# Patient Record
Sex: Female | Born: 1970 | Race: White | Hispanic: No | Marital: Married
Health system: Southern US, Community
[De-identification: ages and names within clinical notes are randomized; demographics above are authoritative.]

## PROBLEM LIST (undated history)

## (undated) DIAGNOSIS — Z809 Family history of malignant neoplasm, unspecified: Secondary | ICD-10-CM

## (undated) DIAGNOSIS — C801 Malignant (primary) neoplasm, unspecified: Secondary | ICD-10-CM

## (undated) HISTORY — DX: Family history of malignant neoplasm, unspecified: Z80.9

## (undated) HISTORY — PX: MASTECTOMY: SHX3

---

## 2005-06-09 ENCOUNTER — Inpatient Hospital Stay (HOSPITAL_COMMUNITY): Admission: AD | Admit: 2005-06-09 | Discharge: 2005-06-09 | Payer: Self-pay | Admitting: Obstetrics and Gynecology

## 2005-06-11 ENCOUNTER — Inpatient Hospital Stay (HOSPITAL_COMMUNITY): Admission: AD | Admit: 2005-06-11 | Discharge: 2005-06-14 | Payer: Self-pay | Admitting: Obstetrics and Gynecology

## 2008-01-27 ENCOUNTER — Emergency Department (HOSPITAL_COMMUNITY): Admission: EM | Admit: 2008-01-27 | Discharge: 2008-01-27 | Payer: Self-pay | Admitting: Emergency Medicine

## 2010-12-04 NOTE — H&P (Signed)
[name], [name]               ACCOUNT NO.:  [id number]   MEDICAL RECORD NO.:  [id number]          PATIENT TYPE:  INP   LOCATION:  9110                          FACILITY:  [facility]   PHYSICIAN:  Richardean Sale, M.D.   DATE OF BIRTH:  1971-01-02   DATE OF ADMISSION:  06/11/2005  DATE OF DISCHARGE:                                HISTORY & PHYSICAL   ADMISSION DIAGNOSIS:  Thirty-nine-plus weeks intrauterine pregnancy in  labor.   HISTORY OF PRESENT ILLNESS:  This is a 40 year old gravida 2 2, para 1-0-0-1  white female who is at 39+ weeks gestation with a due date of June 17, 2005 who presented on the evening of June 11, 2005 complaining of  contractions every two to three minutes starting at 3 p.m.  Upon arrival to the maternity admissions, fetal heart rate tracing was  reactive. She was contracting every two to three minutes and cervix was 3 cm  dilated, 70% and -1 station.  The patient was monitored in maternity  admissions and made adequate cervical change to 5 cm, 80% and -1 station.  She reports good movement. Denies any loss of fluid or vaginal bleeding.  Prenatal care has been at [facility] since 31 weeks and prior to that  in [place].  Pregnancy has been uncomplicated.   PAST OBSTETRIC HISTORY:  Spontaneous vaginal delivery at term.  No  complications.   GYNECOLOGIC HISTORY:  No history of abnormal Pap smears or sexually  transmitted infections.   PAST MEDICAL HISTORY:  No prior hospitalizations.   PAST SURGICAL HISTORY:  None.   FAMILY HISTORY:  Coronary artery disease in grandparents.  No history of  birth defects, congenital anomalies, cystic fibrosis, Down syndrome, spina  bifida or other inherited illnesses.   SOCIAL HISTORY:  She is married.  Denies tobacco, alcohol or drugs.   MEDICATIONS:  Prenatal vitamins.   ALLERGIES:  No known drug allergies.   PRENATAL LABS:  Blood type is B positive, antibody screen negative.  RPR  nonreactive.  Rubella  immune.  Hepatitis B surface antigen negative.  HIV  negative.  One-hour Glucola 84.  Group B strep negative on May 12, 2005.  Last ultrasound at approximately [redacted] weeks gestation, showed an appropriately  growing fetus and normal amniotic fluid.  The patient did undergo an anatomy  ultrasound in [place] that was normal.   PHYSICAL EXAMINATION:  VITAL SIGNS:  Afebrile. Vital signs stable.  GENERAL:  She is well-developed, well-nourished white female in no acute  distress.  HEART:  Regular rate and rhythm.  LUNGS:  Clear to auscultation bilaterally.  ABDOMEN:  Gravid, soft, nontender.  Fundal height appears AGA.  PELVIC:  Cervix is 5 cm, 80%, -1 station, vertex, bag of waters intact.  EXTREMITIES:  Trace edema bilaterally.  Nontender.   Fetal heart rate tracing is reactive.  Tocometer tracing shows contractions  every two to three minutes.   ASSESSMENT:  A 40 year old, gravida 2, para 1 white female at 39+ weeks in  labor.   PLAN:  1.  Will admit to labor and delivery.  2.  The patient  may have intravenous Stadol or epidural as needed for pain      relief.  3.  Continue fetal monitoring and amniotomy if needed.  4.  Anticipated vaginal delivery.      Richardean Sale, M.D.  Electronically Signed     JW/MEDQ  D:  06/12/2005  T:  06/12/2005  Job:  [id number]

## 2011-04-15 LAB — DIFFERENTIAL
Lymphocytes Relative: 28
Lymphs Abs: 1.6
Monocytes Absolute: 0.5
Monocytes Relative: 8
Neutro Abs: 3.7

## 2011-04-15 LAB — URINALYSIS, ROUTINE W REFLEX MICROSCOPIC
Glucose, UA: NEGATIVE
Hgb urine dipstick: NEGATIVE
Specific Gravity, Urine: 1.027
pH: 7

## 2011-04-15 LAB — COMPREHENSIVE METABOLIC PANEL
Albumin: 3.7
Alkaline Phosphatase: 43
BUN: 21
Potassium: 4.5
Total Protein: 6.6

## 2011-04-15 LAB — POCT PREGNANCY, URINE
Operator id: 17359
Preg Test, Ur: NEGATIVE

## 2011-04-15 LAB — CBC
HCT: 40.1
Platelets: 242
RDW: 13.5

## 2011-04-15 LAB — URINE MICROSCOPIC-ADD ON

## 2014-01-31 ENCOUNTER — Other Ambulatory Visit (HOSPITAL_COMMUNITY): Payer: Self-pay | Admitting: Obstetrics

## 2014-01-31 DIAGNOSIS — Z1231 Encounter for screening mammogram for malignant neoplasm of breast: Secondary | ICD-10-CM

## 2014-02-08 ENCOUNTER — Ambulatory Visit (HOSPITAL_COMMUNITY)
Admission: RE | Admit: 2014-02-08 | Discharge: 2014-02-08 | Disposition: A | Discharge status: Home / Self Care | Payer: BC Managed Care – PPO | Source: Ambulatory Visit | Attending: Obstetrics | Admitting: Obstetrics

## 2014-02-08 DIAGNOSIS — Z1231 Encounter for screening mammogram for malignant neoplasm of breast: Secondary | ICD-10-CM

## 2017-09-08 ENCOUNTER — Encounter: Payer: Self-pay | Admitting: Oncology

## 2018-09-25 ENCOUNTER — Encounter: Payer: Self-pay | Admitting: Oncology

## 2018-09-25 DIAGNOSIS — N6042 Mammary duct ectasia of left breast: Secondary | ICD-10-CM | POA: Diagnosis not present

## 2018-09-25 DIAGNOSIS — N6342 Unspecified lump in left breast, subareolar: Secondary | ICD-10-CM | POA: Diagnosis not present

## 2018-09-25 DIAGNOSIS — N63 Unspecified lump in unspecified breast: Secondary | ICD-10-CM | POA: Diagnosis not present

## 2018-09-25 DIAGNOSIS — N6453 Retraction of nipple: Secondary | ICD-10-CM | POA: Diagnosis not present

## 2018-10-02 ENCOUNTER — Encounter: Payer: Self-pay | Admitting: Oncology

## 2018-10-02 ENCOUNTER — Other Ambulatory Visit: Payer: Self-pay | Admitting: Radiology

## 2018-10-02 DIAGNOSIS — C50112 Malignant neoplasm of central portion of left female breast: Secondary | ICD-10-CM | POA: Diagnosis not present

## 2018-10-02 DIAGNOSIS — D0592 Unspecified type of carcinoma in situ of left breast: Secondary | ICD-10-CM | POA: Diagnosis not present

## 2018-10-02 DIAGNOSIS — N6342 Unspecified lump in left breast, subareolar: Secondary | ICD-10-CM | POA: Diagnosis not present

## 2018-10-02 DIAGNOSIS — N6453 Retraction of nipple: Secondary | ICD-10-CM | POA: Diagnosis not present

## 2018-10-02 DIAGNOSIS — N6321 Unspecified lump in the left breast, upper outer quadrant: Secondary | ICD-10-CM | POA: Diagnosis not present

## 2018-10-02 DIAGNOSIS — R921 Mammographic calcification found on diagnostic imaging of breast: Secondary | ICD-10-CM | POA: Diagnosis not present

## 2018-10-03 DIAGNOSIS — N6342 Unspecified lump in left breast, subareolar: Secondary | ICD-10-CM | POA: Diagnosis not present

## 2018-10-09 ENCOUNTER — Encounter: Payer: Self-pay | Admitting: *Deleted

## 2018-10-09 ENCOUNTER — Telehealth: Payer: Self-pay | Admitting: Oncology

## 2018-10-09 NOTE — Telephone Encounter (Signed)
Spoke to patient to confirm morning [facility] appointment for 3/25, packet mailed to patient

## 2018-10-09 NOTE — Telephone Encounter (Signed)
Spoke to patient to confirm morning [facility] appointment for 3/25, email was sent to patient

## 2018-10-10 ENCOUNTER — Telehealth: Payer: Self-pay | Admitting: Radiation Oncology

## 2018-10-10 ENCOUNTER — Other Ambulatory Visit: Payer: Self-pay | Admitting: *Deleted

## 2018-10-10 DIAGNOSIS — Z170 Estrogen receptor positive status [ER+]: Principal | ICD-10-CM | POA: Insufficient documentation

## 2018-10-10 DIAGNOSIS — C50412 Malignant neoplasm of upper-outer quadrant of left female breast: Secondary | ICD-10-CM | POA: Insufficient documentation

## 2018-10-10 NOTE — Telephone Encounter (Signed)
I called the patient to let her know about our plans to be available remotely or to be in touch at a later time. She requests online webex for our discussion. I will coordinate with the navigators to ensure that we can meet with the patient remotely.

## 2018-10-10 NOTE — Progress Notes (Signed)
[facility]  [phone] [phone]     ID: [name] DOB: 1970/07/29  MR#: [id number]  AJG#:[id number]  Patient Care Team: Orpah Melter, MD as PCP - General (Family Medicine) Mauro Kaufmann, RN as Oncology Nurse Navigator Rockwell Germany, RN as Oncology Nurse Navigator Jovita Kussmaul, MD as Consulting Physician (General Surgery) Emelda Kohlbeck, Virgie Dad, MD as Consulting Physician (Oncology) Kyung Rudd, MD as Consulting Physician (Radiation Oncology) Aloha Gell, MD as Consulting Physician (Obstetrics and Gynecology) Chauncey Cruel, MD OTHER MD:  CHIEF COMPLAINT: Estrogen receptor positive breast cancer  CURRENT TREATMENT: Definitive surgery pending   HISTORY OF CURRENT ILLNESS: "[name]" presented with left nipple retraction for 1 week with retroareolar mass in the upper outer quadrant. She underwent bilateral diagnostic mammography with CAD and left breast ultrasonography at [facility] on 09/25/2018 showing: 7 mm oval duct in the left breast; no significant abnormalities in the left axilla. She also underwent additional imaging with left breast digital diagnostic mammogram on 10/02/2018 showing: new 0.4 cm cluster of grouped heterogeneous calcifications in the left breast.  Accordingly on 10/02/2018 she proceeded to biopsy of the left breast area in question. The pathology [id number]) from this procedure showed: mammary carcinoma in situ at the 3 o'clock mass, with possible focal microinvasion; invasive and in situ mammary carcinoma at the 12 o'clock mass, immunostain for E-cadherin is negative in the tumor cells, consistent with a lobular phenotype. Prognostic indicators significant for: estrogen receptor, 90% positive, with moderate staining intensity and progesterone receptor, 100% positive, with strong staining intensity. Proliferation marker Ki67 at <1%. HER2 negative (1+).   The patient's subsequent history is as detailed below.    INTERVAL HISTORY: [name] was evaluated in the multidisciplinary breast cancer clinic on 10/11/2018 with her husband [name] participating by phone. Her case was also presented at the multidisciplinary breast cancer conference on the same day. At that time a preliminary plan was proposed: Left mastectomy with sentinel lymph node sampling, Oncotype or MammaPrint depending on the final pathology report, adjuvant radiation, antiestrogens.   REVIEW OF SYSTEMS: Aside from the changes in the breast that the patient noted, there have been no other symptoms related to her breast cancer.  She reports night sweats.  He denies unusual headaches, visual changes, nausea, vomiting, stiff neck, dizziness, or gait imbalance. There has been no cough, phlegm production, or pleurisy, no chest pain or pressure, and no change in bowel or bladder habits. The patient denies rash, bleeding, unexplained fatigue or unexplained weight loss. A detailed review of systems was otherwise entirely negative.   PAST MEDICAL HISTORY: History reviewed. No pertinent past medical history.  PAST SURGICAL HISTORY: History reviewed. No pertinent surgical history.  FAMILY HISTORY History reviewed. No pertinent family history. As of March 2020, patient's father is living and healthy at age 61. Patient's mother is also living and healthy at age 27. The patient denies a family hx of breast or ovarian cancer. She has 1 sister.  GYNECOLOGIC HISTORY:  No LMP recorded. Menarche: 48 years old Age at first live birth: 48 years old [place] P 2 LMP in 2010 Contraceptive currently has Mirena IUD in place, reports she used birth control pills between ages 5 to 55 HRT n/a  Hysterectomy? no BSO? 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  She has a PhD in Probation officer.  Husband [name] is an Art gallery manager. She lives at home with her husband and 3 children. She has two  children, [name] age 22 and  [name] age 39. Husband [name] has one daughter, Ammy Lienhard age 70, who lives with them.      ADVANCED DIRECTIVES: In the absence of any documentation to the contrary her husband [name] is her HCPOA.   HEALTH MAINTENANCE: Social History   Tobacco Use  . Smoking status: Never Smoker  . Smokeless tobacco: Never Used  Substance Use Topics  . Alcohol use: Yes    Alcohol/week: 3.0 standard drinks    Types: 3 Standard drinks or equivalent per week  . Drug use: Never     Colonoscopy: never done  PAP: 08/2017  Bone density: never done   No Known Allergies  No current outpatient medications on file.   No current facility-administered medications for this visit.     OBJECTIVE: Young white woman in no acute distress  There were no vitals filed for this visit.   There is no height or weight on file to calculate BMI.   Wt Readings from Last 3 Encounters:  10/11/18 140 lb (63.5 kg)      ECOG FS:0 - Asymptomatic  Ocular: Sclerae unicteric, pupils round and equal Ear-nose-throat: Oropharynx clear and moist Lymphatic: No cervical or supraclavicular adenopathy Lungs no rales or rhonchi Heart regular rate and rhythm Abd soft, nontender, positive bowel sounds MSK no focal spinal tenderness, no joint edema Neuro: non-focal, well-oriented, appropriate affect Breasts: The right breast is unremarkable.  The left breast is status post recent biopsies.  There are moderate ecchymoses.  The nipple is slightly inverted.  A photograph, with the right breast in for comparison, is below  Left breast 10/11/2018; right breast for comparison of nipple contour     LAB RESULTS:  CMP     Component Value Date/Time   NA 141 10/11/2018 0846   K 4.4 10/11/2018 0846   CL 107 10/11/2018 0846   CO2 24 10/11/2018 0846   GLUCOSE 86 10/11/2018 0846   BUN 14 10/11/2018 0846   CREATININE 0.87 10/11/2018 0846   CALCIUM 9.1 10/11/2018 0846   PROT 7.1 10/11/2018 0846   ALBUMIN 4.1  10/11/2018 0846   AST 16 10/11/2018 0846   ALT 18 10/11/2018 0846   ALKPHOS 40 10/11/2018 0846   BILITOT 0.6 10/11/2018 0846   GFRNONAA >60 10/11/2018 0846   GFRAA >60 10/11/2018 0846    No results found for: TOTALPROTELP, ALBUMINELP, A1GS, A2GS, BETS, BETA2SER, GAMS, MSPIKE, SPEI  No results found for: KPAFRELGTCHN, LAMBDASER, KAPLAMBRATIO  Lab Results  Component Value Date   WBC 5.9 10/11/2018   NEUTROABS 3.4 10/11/2018   HGB 14.4 10/11/2018   HCT 44.9 10/11/2018   MCV 95.9 10/11/2018   PLT 314 10/11/2018    '@LASTCHEMISTRY' @  No results found for: LABCA2  No components found for: WUJWJX914  No results for input(s): INR in the last 168 hours.  No results found for: LABCA2  No results found for: NWG956  No results found for: OZH086  No results found for: VHQ469  No results found for: CA2729  No components found for: HGQUANT  No results found for: CEA1 / No results found for: CEA1   No results found for: AFPTUMOR  No results found for: CHROMOGRNA  No results found for: PSA1  Appointment on 10/11/2018  Component Date Value Ref Range Status  . WBC Count 10/11/2018 5.9  4.0 - 10.5 K/uL Final  . RBC 10/11/2018 4.68  3.87 - 5.11 MIL/uL Final  . Hemoglobin 10/11/2018 14.4  12.0 - 15.0  g/dL Final  . HCT 10/11/2018 44.9  36.0 - 46.0 % Final  . MCV 10/11/2018 95.9  80.0 - 100.0 fL Final  . MCH 10/11/2018 30.8  26.0 - 34.0 pg Final  . MCHC 10/11/2018 32.1  30.0 - 36.0 g/dL Final  . RDW 10/11/2018 12.6  11.5 - 15.5 % Final  . Platelet Count 10/11/2018 314  150 - 400 K/uL Final  . nRBC 10/11/2018 0.0  0.0 - 0.2 % Final  . Neutrophils Relative % 10/11/2018 57  % Final  . Neutro Abs 10/11/2018 3.4  1.7 - 7.7 K/uL Final  . Lymphocytes Relative 10/11/2018 30  % Final  . Lymphs Abs 10/11/2018 1.8  0.7 - 4.0 K/uL Final  . Monocytes Relative 10/11/2018 8  % Final  . Monocytes Absolute 10/11/2018 0.5  0.1 - 1.0 K/uL Final  . Eosinophils Relative 10/11/2018 4  % Final   . Eosinophils Absolute 10/11/2018 0.3  0.0 - 0.5 K/uL Final  . Basophils Relative 10/11/2018 1  % Final  . Basophils Absolute 10/11/2018 0.0  0.0 - 0.1 K/uL Final  . Immature Granulocytes 10/11/2018 0  % Final  . Abs Immature Granulocytes 10/11/2018 0.01  0.00 - 0.07 K/uL Final   Performed at [facility], [place] [address], [place], [place] [address]  . Sodium 10/11/2018 141  135 - 145 mmol/L Final  . Potassium 10/11/2018 4.4  3.5 - 5.1 mmol/L Final  . Chloride 10/11/2018 107  98 - 111 mmol/L Final  . CO2 10/11/2018 24  22 - 32 mmol/L Final  . Glucose, Bld 10/11/2018 86  70 - 99 mg/dL Final  . BUN 10/11/2018 14  6 - 20 mg/dL Final  . Creatinine 10/11/2018 0.87  0.44 - 1.00 mg/dL Final  . Calcium 10/11/2018 9.1  8.9 - 10.3 mg/dL Final  . Total Protein 10/11/2018 7.1  6.5 - 8.1 g/dL Final  . Albumin 10/11/2018 4.1  3.5 - 5.0 g/dL Final  . AST 10/11/2018 16  15 - 41 U/L Final  . ALT 10/11/2018 18  0 - 44 U/L Final  . Alkaline Phosphatase 10/11/2018 40  38 - 126 U/L Final  . Total Bilirubin 10/11/2018 0.6  0.3 - 1.2 mg/dL Final  . GFR, Est Non Af Am 10/11/2018 >60  >60 mL/min Final  . GFR, Est AFR Am 10/11/2018 >60  >60 mL/min Final  . Anion gap 10/11/2018 10  5 - 15 Final   Performed at [facility], [place] [address], [place], [place] [address]    (this displays the last labs from the last 3 days)  No results found for: TOTALPROTELP, ALBUMINELP, A1GS, A2GS, BETS, BETA2SER, GAMS, MSPIKE, SPEI (this displays SPEP labs)  No results found for: KPAFRELGTCHN, LAMBDASER, KAPLAMBRATIO (kappa/lambda light chains)  No results found for: HGBA, HGBA2QUANT, HGBFQUANT, HGBSQUAN (Hemoglobinopathy evaluation)   No results found for: LDH  No results found for: IRON, TIBC, IRONPCTSAT (Iron and TIBC)  No results found for: FERRITIN  Urinalysis No results found for: COLORURINE, APPEARANCEUR, LABSPEC, PHURINE, GLUCOSEU, HGBUR, BILIRUBINUR, KETONESUR,  PROTEINUR, UROBILINOGEN, NITRITE, LEUKOCYTESUR   STUDIES: Mammography studies reviewed with the patient  ELIGIBLE FOR AVAILABLE RESEARCH PROTOCOL: no  ASSESSMENT: 48 y.o. 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 Specifically we reviewed the biology of the patient's diagnosis and the specifics of her situation.  We first reviewed the fact that cancer is not one disease but more than 100 different diseases and that it is important to keep them separate-- otherwise when friends and relatives discuss their own cancer experiences with Aziya confusion can result. Similarly we explained that if breast cancer spreads to the bone or liver, the patient would not have bone cancer or liver cancer, but breast cancer in the bone and breast cancer in the liver: one cancer in three places-- not 3 different cancers which otherwise would have to be treated in 3 different ways.  We discussed the difference between local and systemic therapy. In terms of loco-regional treatment, lumpectomy plus radiation is equivalent to mastectomy as far as survival is concerned. For this reason, and because the cosmetic results are generally superior, we recommend breast conserving surgery. .  We then discussed the rationale for systemic therapy. There is some risk that this cancer may have already spread to other parts of her body.  Patients frequently ask at this point about bone scans, CAT scans and PET scans to find out if they have occult breast cancer somewhere else. The problem is that in early stage disease we are much more likely to find false positives then true cancers and this would expose the patient to unnecessary procedures as well as unnecessary radiation. Scans cannot answer the question the patient really would like to know, which is whether she has microscopic disease elsewhere in her body. For those reasons we do not recommend them.  Of course we would proceed to aggressive evaluation of any symptoms that might suggest metastatic disease, but that is not the case here.  Next we went over the options for systemic therapy which are anti-estrogens, anti-HER-2 immunotherapy, and chemotherapy. Jhoanna does not meet criteria for anti-HER-2 immunotherapy. She is a good candidate for anti-estrogens.  The question of chemotherapy is more complicated. Chemotherapy is most effective in rapidly growing, aggressive tumors. It is much less effective in low-grade, slow growing cancers, like [place] 's. For that reason we are going to request an Oncotype or MammaPrint from the definitive surgical sample, as suggested by NCCN guidelines. That will help [place] make a definitive decision regarding chemotherapy in this case.  The plan accordingly is to start with an MRI in this lobular breast cancer case, proceed to definitive surgery as appropriate, and then review the Oncotype or MammaPrint results.  I have tentatively scheduled a return appointment with me late June but we will discuss her situation once we have the genomics on hand  Yanissa has a good understanding of the overall plan. She agrees with it. She knows the goal of treatment in her case is cure. She will call with any problems that may develop before her next visit here.  Chauncey Cruel, MD   10/11/2018 3:57 PM Medical Oncology and Hematology [facility] [address] [place], [place] [address] Tel. [phone]    Fax. [phone]  This document serves as a record of services personally performed by Lurline Del, MD. It was created on his behalf by  Wilburn Mylar, a trained medical scribe. The creation of this record is based on the scribe's personal observations and the provider's statements to them.   I, Lurline Del MD, have reviewed the above documentation for accuracy and completeness, and I agree with the above.

## 2018-10-11 ENCOUNTER — Encounter: Payer: Self-pay | Admitting: Plastic Surgery

## 2018-10-11 ENCOUNTER — Ambulatory Visit: Payer: Self-pay | Admitting: General Surgery

## 2018-10-11 ENCOUNTER — Encounter: Payer: Self-pay | Admitting: Oncology

## 2018-10-11 ENCOUNTER — Other Ambulatory Visit: Payer: Self-pay | Admitting: *Deleted

## 2018-10-11 ENCOUNTER — Inpatient Hospital Stay: Payer: 59 | Attending: Oncology | Admitting: Oncology

## 2018-10-11 ENCOUNTER — Inpatient Hospital Stay: Payer: 59

## 2018-10-11 ENCOUNTER — Ambulatory Visit (INDEPENDENT_AMBULATORY_CARE_PROVIDER_SITE_OTHER): Payer: 59 | Admitting: Plastic Surgery

## 2018-10-11 ENCOUNTER — Ambulatory Visit: Payer: 59 | Attending: Radiation Oncology | Admitting: Radiation Oncology

## 2018-10-11 ENCOUNTER — Telehealth: Payer: Self-pay | Admitting: Oncology

## 2018-10-11 ENCOUNTER — Other Ambulatory Visit: Payer: Self-pay

## 2018-10-11 VITALS — BP 127/86 | HR 79 | Temp 98.8°F | Ht 65.5 in | Wt 140.0 lb

## 2018-10-11 VITALS — BP 127/86 | HR 79 | Temp 98.8°F | Resp 18 | Ht 65.0 in | Wt 140.0 lb

## 2018-10-11 DIAGNOSIS — Z170 Estrogen receptor positive status [ER+]: Secondary | ICD-10-CM | POA: Diagnosis not present

## 2018-10-11 DIAGNOSIS — C50412 Malignant neoplasm of upper-outer quadrant of left female breast: Secondary | ICD-10-CM

## 2018-10-11 DIAGNOSIS — C50112 Malignant neoplasm of central portion of left female breast: Secondary | ICD-10-CM | POA: Diagnosis not present

## 2018-10-11 LAB — CBC WITH DIFFERENTIAL (CANCER CENTER ONLY)
Abs Immature Granulocytes: 0.01 K/uL (ref 0.00–0.07)
Basophils Absolute: 0 K/uL (ref 0.0–0.1)
Basophils Relative: 1 %
Eosinophils Absolute: 0.3 K/uL (ref 0.0–0.5)
Eosinophils Relative: 4 %
HCT: 44.9 % (ref 36.0–46.0)
Hemoglobin: 14.4 g/dL (ref 12.0–15.0)
Immature Granulocytes: 0 %
Lymphocytes Relative: 30 %
Lymphs Abs: 1.8 K/uL (ref 0.7–4.0)
MCH: 30.8 pg (ref 26.0–34.0)
MCHC: 32.1 g/dL (ref 30.0–36.0)
MCV: 95.9 fL (ref 80.0–100.0)
Monocytes Absolute: 0.5 K/uL (ref 0.1–1.0)
Monocytes Relative: 8 %
Neutro Abs: 3.4 K/uL (ref 1.7–7.7)
Neutrophils Relative %: 57 %
Platelet Count: 314 K/uL (ref 150–400)
RBC: 4.68 MIL/uL (ref 3.87–5.11)
RDW: 12.6 % (ref 11.5–15.5)
WBC Count: 5.9 K/uL (ref 4.0–10.5)
nRBC: 0 % (ref 0.0–0.2)

## 2018-10-11 LAB — CMP (CANCER CENTER ONLY)
ALT: 18 U/L (ref 0–44)
AST: 16 U/L (ref 15–41)
Albumin: 4.1 g/dL (ref 3.5–5.0)
Alkaline Phosphatase: 40 U/L (ref 38–126)
Anion gap: 10 (ref 5–15)
BUN: 14 mg/dL (ref 6–20)
CO2: 24 mmol/L (ref 22–32)
Calcium: 9.1 mg/dL (ref 8.9–10.3)
Chloride: 107 mmol/L (ref 98–111)
Creatinine: 0.87 mg/dL (ref 0.44–1.00)
GFR, Est AFR Am: >60 mL/min
GFR, Estimated: >60 mL/min
Glucose, Bld: 86 mg/dL (ref 70–99)
Potassium: 4.4 mmol/L (ref 3.5–5.1)
Sodium: 141 mmol/L (ref 135–145)
Total Bilirubin: 0.6 mg/dL (ref 0.3–1.2)
Total Protein: 7.1 g/dL (ref 6.5–8.1)

## 2018-10-11 NOTE — H&P (View-Only) (Signed)
Patient ID: [name], female    DOB: 06-Jul-1971, 48 y.o.   MRN: [id number]   Chief Complaint  Patient presents with  . Breast Problem  . Breast Cancer    The patient is a 48 yrs old wf here for consultation for left breast reconstruction with her husband.  She noticed the left nipple was pointing downward.  She then palpated a mass around the nipple areola area.  She had a mammogram followed by biopsy.  The results are not in the computer yet.  She is 5 feet 5 inches and 140 pounds.  She is a 36 A bra size.  She is interested in reconstruction.  She is not a smoker and is otherwise in good health. Genetics may be ordered.  She is also scheduled to see the radiation oncologist and radiation is planned.  She was sent by Dr. Marlou Starks.   Review of Systems  Constitutional: Negative.  Negative for activity change and appetite change.  HENT: Negative.   Eyes: Negative.   Respiratory: Negative.  Negative for chest tightness and shortness of breath.   Cardiovascular: Negative.  Negative for palpitations.  Gastrointestinal: Negative.  Negative for abdominal pain.  Endocrine: Negative.   Genitourinary: Negative.   Musculoskeletal: Negative.  Negative for joint swelling.  Neurological: Negative.   Hematological: Negative.   Psychiatric/Behavioral: Negative.     History reviewed. No pertinent past medical history.  History reviewed. No pertinent surgical history.   No current outpatient medications on file.   Objective:   Vitals:   10/11/18 1227  BP: 127/86  Pulse: 79  Temp: 98.8 F (37.1 C)  SpO2: 99%    Physical Exam Vitals signs and nursing note reviewed.  Constitutional:      Appearance: Normal appearance.  HENT:     Head: Normocephalic and atraumatic.     Nose: Nose normal.     Mouth/Throat:     Mouth: Mucous membranes are moist.  Eyes:     Extraocular Movements: Extraocular movements intact.  Cardiovascular:     Rate and Rhythm: Normal rate.     Pulses:  Normal pulses.  Pulmonary:     Effort: Pulmonary effort is normal. No respiratory distress.     Breath sounds: No wheezing.  Abdominal:     General: Abdomen is flat. There is no distension.  Skin:    Capillary Refill: Capillary refill takes less than 2 seconds.  Neurological:     General: No focal deficit present.     Mental Status: She is alert.  Psychiatric:        Mood and Affect: Mood normal.        Behavior: Behavior normal.     Assessment & Plan:  Malignant neoplasm of upper-outer quadrant of left breast in female, estrogen receptor positive ([place])  Assessment and Plan:  A long, detailed conversation was had regarding the patient's options for breast reconstruction. Five main points, which are explained to all breast reconstruction patients, were discussed.  1. Breast reconstruction is an optional process.  2. Breast reconstruction is a multi-stage process which involves multiple surgeries spaced several months apart. The entire process can take over one year.  3. The major goal of breast reconstruction is to have the patient look normal in clothing. When naked, there will always be scars.  4. Asymmetries are often present during the reconstruction process. Several operations may be needed, including surgery to the non-cancerous breast, to achieve satisfactory results.  5. No matter the  reconstructive method, there are ways that the reconstruction can fail and a secondary reconstructive plan would need to be created.   A general discussion regarding all available methods of breast reconstruction were discussed. The types of reconstructions described included.  1. Tissue expander and implant based reconstruction, both single and multi-stage approaches.  2. Autologous only reconstructions, including free abdominal-tissue based reconstructions.  3. Combination procedures, particularly latissismus dorsi flaps combined with either expanders or implants.  For each of the reconstruction  methods mentioned above, the risks, benefits, alternatives, scarring, and recovery time were discussed in great detail. Specific risks detailed included bleeding, infection, hematoma, seroma, scarring, pain, wound healing complications, flap loss, fat necrosis, capsular contracture, need for implant removal, donor site complications, bulge, hernia, umbilical necrosis, need for urgent reoperation, and need for dressing changes were discussed.   Assessment  Once all reconstruction options were presented, a focused discussion was had regarding the patient's suitability for each of these procedures.  A total of 50 minutes of face-to-face time was spent in this encounter, of which >50% was spent in counseling.  We will attempt for placement of an left breast expander and Flex HD placement if the skin allows.  Patient is aware that it won't be placed if the skin is too tight and there is compromise of the skin flaps.  She would then think about a latissimus muscle flap 6-12 months after the radiation ends.   Harvey, DO

## 2018-10-11 NOTE — Progress Notes (Signed)
Patient ID: [name], female    DOB: [birth year], 48 y.o.   MRN: [id number]   Chief Complaint  Patient presents with  . Breast Problem  . Breast Cancer    The patient is a 48 yrs old wf here for consultation for left breast reconstruction with her husband.  She noticed the left nipple was pointing downward.  She then palpated a mass around the nipple areola area.  She had a mammogram followed by biopsy.  The results are not in the computer yet.  She is 5 feet 5 inches and 140 pounds.  She is a 36 A bra size.  She is interested in reconstruction.  She is not a smoker and is otherwise in good health. Genetics may be ordered.  She is also scheduled to see the radiation oncologist and radiation is planned.  She was sent by Dr. Marlou Starks.   Review of Systems  Constitutional: Negative.  Negative for activity change and appetite change.  HENT: Negative.   Eyes: Negative.   Respiratory: Negative.  Negative for chest tightness and shortness of breath.   Cardiovascular: Negative.  Negative for palpitations.  Gastrointestinal: Negative.  Negative for abdominal pain.  Endocrine: Negative.   Genitourinary: Negative.   Musculoskeletal: Negative.  Negative for joint swelling.  Neurological: Negative.   Hematological: Negative.   Psychiatric/Behavioral: Negative.     History reviewed. No pertinent past medical history.  History reviewed. No pertinent surgical history.   No current outpatient medications on file.   Objective:   Vitals:   10/11/18 1227  BP: 127/86  Pulse: 79  Temp: 98.8 F (37.1 C)  SpO2: 99%    Physical Exam Vitals signs and nursing note reviewed.  Constitutional:      Appearance: Normal appearance.  HENT:     Head: Normocephalic and atraumatic.     Nose: Nose normal.     Mouth/Throat:     Mouth: Mucous membranes are moist.  Eyes:     Extraocular Movements: Extraocular movements intact.  Cardiovascular:     Rate and Rhythm: Normal rate.     Pulses:  Normal pulses.  Pulmonary:     Effort: Pulmonary effort is normal. No respiratory distress.     Breath sounds: No wheezing.  Abdominal:     General: Abdomen is flat. There is no distension.  Skin:    Capillary Refill: Capillary refill takes less than 2 seconds.  Neurological:     General: No focal deficit present.     Mental Status: She is alert.  Psychiatric:        Mood and Affect: Mood normal.        Behavior: Behavior normal.     Assessment & Plan:  Malignant neoplasm of upper-outer quadrant of left breast in female, estrogen receptor positive ([place])  Assessment and Plan:  A long, detailed conversation was had regarding the patient's options for breast reconstruction. Five main points, which are explained to all breast reconstruction patients, were discussed.  1. Breast reconstruction is an optional process.  2. Breast reconstruction is a multi-stage process which involves multiple surgeries spaced several months apart. The entire process can take over one year.  3. The major goal of breast reconstruction is to have the patient look normal in clothing. When naked, there will always be scars.  4. Asymmetries are often present during the reconstruction process. Several operations may be needed, including surgery to the non-cancerous breast, to achieve satisfactory results.  5. No matter the  reconstructive method, there are ways that the reconstruction can fail and a secondary reconstructive plan would need to be created.   A general discussion regarding all available methods of breast reconstruction were discussed. The types of reconstructions described included.  1. Tissue expander and implant based reconstruction, both single and multi-stage approaches.  2. Autologous only reconstructions, including free abdominal-tissue based reconstructions.  3. Combination procedures, particularly latissismus dorsi flaps combined with either expanders or implants.  For each of the reconstruction  methods mentioned above, the risks, benefits, alternatives, scarring, and recovery time were discussed in great detail. Specific risks detailed included bleeding, infection, hematoma, seroma, scarring, pain, wound healing complications, flap loss, fat necrosis, capsular contracture, need for implant removal, donor site complications, bulge, hernia, umbilical necrosis, need for urgent reoperation, and need for dressing changes were discussed.   Assessment  Once all reconstruction options were presented, a focused discussion was had regarding the patient's suitability for each of these procedures.  A total of 50 minutes of face-to-face time was spent in this encounter, of which >50% was spent in counseling.  We will attempt for placement of an left breast expander and Flex HD placement if the skin allows.  Patient is aware that it won't be placed if the skin is too tight and there is compromise of the skin flaps.  She would then think about a latissimus muscle flap 6-12 months after the radiation ends.   [place], DO

## 2018-10-11 NOTE — Telephone Encounter (Signed)
Called regarding 6/29 °

## 2018-10-12 ENCOUNTER — Other Ambulatory Visit: Payer: Self-pay

## 2018-10-12 ENCOUNTER — Encounter: Payer: Self-pay | Admitting: Oncology

## 2018-10-12 ENCOUNTER — Ambulatory Visit
Admission: RE | Admit: 2018-10-12 | Discharge: 2018-10-12 | Disposition: A | Discharge status: Home / Self Care | Payer: 59 | Source: Ambulatory Visit | Attending: General Surgery | Admitting: General Surgery

## 2018-10-12 ENCOUNTER — Encounter (HOSPITAL_BASED_OUTPATIENT_CLINIC_OR_DEPARTMENT_OTHER): Payer: Self-pay | Admitting: *Deleted

## 2018-10-12 DIAGNOSIS — Z170 Estrogen receptor positive status [ER+]: Principal | ICD-10-CM

## 2018-10-12 DIAGNOSIS — C50412 Malignant neoplasm of upper-outer quadrant of left female breast: Secondary | ICD-10-CM | POA: Diagnosis not present

## 2018-10-12 MED ORDER — GADOBUTROL 1 MMOL/ML IV SOLN
7.0000 mL | Freq: Once | INTRAVENOUS | Status: AC | PRN
  Administered 2018-10-12: 7 mL via INTRAVENOUS

## 2018-10-13 NOTE — Progress Notes (Signed)
Ensure pre surgery drink given with instructions to complete by 0430 dos, surgical soap given with instructions, pt verbalized understanding. 

## 2018-10-16 ENCOUNTER — Telehealth: Payer: Self-pay | Admitting: *Deleted

## 2018-10-16 ENCOUNTER — Other Ambulatory Visit: Payer: Self-pay | Admitting: *Deleted

## 2018-10-16 NOTE — Telephone Encounter (Signed)
Spoke to pt concerning [place] from 3.25.20. Denies questions or concerns regarding dx or treatment care plan. Encourage pt to call with needs. Received verbal understanding.

## 2018-10-17 NOTE — Anesthesia Preprocedure Evaluation (Signed)
Anesthesia Evaluation    Reviewed: Allergy & Precautions, H&P , Patient's Chart, lab work & pertinent test results  Airway Mallampati: II  TM Distance: >3 FB Neck ROM: Full    Dental no notable dental hx.    Pulmonary neg pulmonary ROS,    Pulmonary exam normal breath sounds clear to auscultation       Cardiovascular Exercise Tolerance: Good negative cardio ROS Normal cardiovascular exam Rhythm:Regular Rate:Normal     Neuro/Psych negative neurological ROS  negative psych ROS   GI/Hepatic negative GI ROS, Neg liver ROS,   Endo/Other  negative endocrine ROS  Renal/GU negative Renal ROS  negative genitourinary   Musculoskeletal   Abdominal   Peds  Hematology negative hematology ROS (+)   Anesthesia Other Findings   Reproductive/Obstetrics negative OB ROS                             Anesthesia Physical Anesthesia Plan  ASA: II  Anesthesia Plan: General   Post-op Pain Management:    Induction: Intravenous  PONV Risk Score and Plan: 2 and Treatment may vary due to age or medical condition, Ondansetron, Dexamethasone and Midazolam  Airway Management Planned: Oral ETT and LMA  Additional Equipment:   Intra-op Plan:   Post-operative Plan: Extubation in OR  Informed Consent: I have reviewed the patients History and Physical, chart, labs and discussed the procedure including the risks, benefits and alternatives for the proposed anesthesia with the patient or authorized representative who has indicated his/her understanding and acceptance.       Plan Discussed with: CRNA, Anesthesiologist and Surgeon  Anesthesia Plan Comments: (  )        Anesthesia Quick Evaluation

## 2018-10-18 ENCOUNTER — Ambulatory Visit (HOSPITAL_BASED_OUTPATIENT_CLINIC_OR_DEPARTMENT_OTHER)
Admission: RE | Admit: 2018-10-18 | Discharge: 2018-10-19 | Disposition: A | Discharge status: Home / Self Care | Payer: 59 | Attending: Plastic Surgery | Admitting: Plastic Surgery

## 2018-10-18 ENCOUNTER — Encounter (HOSPITAL_BASED_OUTPATIENT_CLINIC_OR_DEPARTMENT_OTHER)
Admission: RE | Disposition: A | Discharge status: Home / Self Care | Payer: Self-pay | Source: Home / Self Care | Attending: Plastic Surgery

## 2018-10-18 ENCOUNTER — Ambulatory Visit (HOSPITAL_COMMUNITY): Payer: 59

## 2018-10-18 ENCOUNTER — Ambulatory Visit (HOSPITAL_COMMUNITY)
Admission: RE | Admit: 2018-10-18 | Discharge: 2018-10-18 | Disposition: A | Discharge status: Home / Self Care | Payer: 59 | Source: Ambulatory Visit | Attending: General Surgery | Admitting: General Surgery

## 2018-10-18 ENCOUNTER — Ambulatory Visit (HOSPITAL_BASED_OUTPATIENT_CLINIC_OR_DEPARTMENT_OTHER): Payer: 59 | Admitting: Anesthesiology

## 2018-10-18 ENCOUNTER — Encounter (HOSPITAL_BASED_OUTPATIENT_CLINIC_OR_DEPARTMENT_OTHER): Payer: Self-pay | Admitting: Anesthesiology

## 2018-10-18 DIAGNOSIS — C50012 Malignant neoplasm of nipple and areola, left female breast: Secondary | ICD-10-CM | POA: Diagnosis not present

## 2018-10-18 DIAGNOSIS — Z421 Encounter for breast reconstruction following mastectomy: Secondary | ICD-10-CM | POA: Diagnosis not present

## 2018-10-18 DIAGNOSIS — Z793 Long term (current) use of hormonal contraceptives: Secondary | ICD-10-CM | POA: Diagnosis not present

## 2018-10-18 DIAGNOSIS — C773 Secondary and unspecified malignant neoplasm of axilla and upper limb lymph nodes: Secondary | ICD-10-CM | POA: Insufficient documentation

## 2018-10-18 DIAGNOSIS — C50412 Malignant neoplasm of upper-outer quadrant of left female breast: Secondary | ICD-10-CM | POA: Diagnosis not present

## 2018-10-18 DIAGNOSIS — Z170 Estrogen receptor positive status [ER+]: Principal | ICD-10-CM

## 2018-10-18 DIAGNOSIS — Z8249 Family history of ischemic heart disease and other diseases of the circulatory system: Secondary | ICD-10-CM | POA: Insufficient documentation

## 2018-10-18 DIAGNOSIS — C801 Malignant (primary) neoplasm, unspecified: Secondary | ICD-10-CM

## 2018-10-18 DIAGNOSIS — C50912 Malignant neoplasm of unspecified site of left female breast: Secondary | ICD-10-CM | POA: Diagnosis not present

## 2018-10-18 DIAGNOSIS — Z975 Presence of (intrauterine) contraceptive device: Secondary | ICD-10-CM | POA: Diagnosis not present

## 2018-10-18 DIAGNOSIS — C50112 Malignant neoplasm of central portion of left female breast: Secondary | ICD-10-CM | POA: Insufficient documentation

## 2018-10-18 DIAGNOSIS — G8918 Other acute postprocedural pain: Secondary | ICD-10-CM | POA: Diagnosis not present

## 2018-10-18 DIAGNOSIS — C50919 Malignant neoplasm of unspecified site of unspecified female breast: Secondary | ICD-10-CM | POA: Diagnosis present

## 2018-10-18 HISTORY — PX: BREAST RECONSTRUCTION WITH PLACEMENT OF TISSUE EXPANDER AND FLEX HD (ACELLULAR HYDRATED DERMIS): SHX6295

## 2018-10-18 HISTORY — DX: Malignant (primary) neoplasm, unspecified: C80.1

## 2018-10-18 HISTORY — PX: MASTECTOMY W/ SENTINEL NODE BIOPSY: SHX2001

## 2018-10-18 SURGERY — MASTECTOMY WITH SENTINEL LYMPH NODE BIOPSY
Anesthesia: General | Site: Breast | Laterality: Left

## 2018-10-18 MED ORDER — DIAZEPAM 2 MG PO TABS: 2.0000 mg | ORAL_TABLET | Freq: Two times a day (BID) | ORAL | Status: DC | PRN

## 2018-10-18 MED ORDER — MEPERIDINE HCL 25 MG/ML IJ SOLN: 6.2500 mg | INTRAMUSCULAR | Status: DC | PRN

## 2018-10-18 MED ORDER — CELECOXIB 200 MG PO CAPS
200.0000 mg | ORAL_CAPSULE | ORAL | Status: AC
  Administered 2018-10-18: 200 mg via ORAL

## 2018-10-18 MED ORDER — ACETAMINOPHEN 500 MG PO TABS
1000.0000 mg | ORAL_TABLET | ORAL | Status: AC
  Administered 2018-10-18: 1000 mg via ORAL

## 2018-10-18 MED ORDER — CEPHALEXIN 500 MG PO CAPS: 500.0000 mg | ORAL_CAPSULE | Freq: Four times a day (QID) | ORAL | 0 refills | Status: AC

## 2018-10-18 MED ORDER — DIPHENHYDRAMINE HCL 50 MG/ML IJ SOLN: 12.5000 mg | Freq: Four times a day (QID) | INTRAMUSCULAR | Status: DC | PRN

## 2018-10-18 MED ORDER — DIAZEPAM 2 MG PO TABS: 2.0000 mg | ORAL_TABLET | Freq: Four times a day (QID) | ORAL | 0 refills | Status: DC | PRN

## 2018-10-18 MED ORDER — CELECOXIB 200 MG PO CAPS
ORAL_CAPSULE | ORAL | Status: AC
  Filled 2018-10-18: qty 1

## 2018-10-18 MED ORDER — FENTANYL CITRATE (PF) 100 MCG/2ML IJ SOLN
25.0000 ug | INTRAMUSCULAR | Status: DC | PRN
  Administered 2018-10-18: 25 ug via INTRAVENOUS

## 2018-10-18 MED ORDER — DEXAMETHASONE SODIUM PHOSPHATE 4 MG/ML IJ SOLN
INTRAMUSCULAR | Status: DC | PRN
  Administered 2018-10-18: 10 mg via INTRAVENOUS

## 2018-10-18 MED ORDER — SCOPOLAMINE 1 MG/3DAYS TD PT72
MEDICATED_PATCH | TRANSDERMAL | Status: AC
  Filled 2018-10-18: qty 1

## 2018-10-18 MED ORDER — CHLORHEXIDINE GLUCONATE CLOTH 2 % EX PADS: 6.0000 | MEDICATED_PAD | Freq: Once | CUTANEOUS | Status: DC

## 2018-10-18 MED ORDER — BUPIVACAINE LIPOSOME 1.3 % IJ SUSP
INTRAMUSCULAR | Status: DC | PRN
  Administered 2018-10-18: 10 mL

## 2018-10-18 MED ORDER — HYDROCODONE-ACETAMINOPHEN 5-325 MG PO TABS
1.0000 | ORAL_TABLET | ORAL | Status: DC | PRN
  Administered 2018-10-18 (×3): 1 via ORAL
  Administered 2018-10-19: 2 via ORAL
  Administered 2018-10-19: 1 via ORAL
  Filled 2018-10-18: qty 2
  Filled 2018-10-18 (×4): qty 1

## 2018-10-18 MED ORDER — FENTANYL CITRATE (PF) 100 MCG/2ML IJ SOLN
INTRAMUSCULAR | Status: AC
  Filled 2018-10-18: qty 2

## 2018-10-18 MED ORDER — FENTANYL CITRATE (PF) 100 MCG/2ML IJ SOLN: 25.0000 ug | INTRAMUSCULAR | Status: DC | PRN

## 2018-10-18 MED ORDER — SENNA 8.6 MG PO TABS
1.0000 | ORAL_TABLET | Freq: Two times a day (BID) | ORAL | Status: DC
  Administered 2018-10-18: 8.6 mg via ORAL
  Filled 2018-10-18: qty 1

## 2018-10-18 MED ORDER — ACETAMINOPHEN 325 MG PO TABS: 325.0000 mg | ORAL_TABLET | ORAL | Status: DC | PRN

## 2018-10-18 MED ORDER — CLONIDINE HCL (ANALGESIA) 100 MCG/ML EP SOLN
EPIDURAL | Status: DC | PRN
  Administered 2018-10-18: 100 ug

## 2018-10-18 MED ORDER — SCOPOLAMINE 1 MG/3DAYS TD PT72
1.0000 | MEDICATED_PATCH | Freq: Once | TRANSDERMAL | Status: DC | PRN
  Administered 2018-10-18: 1.5 mg via TRANSDERMAL

## 2018-10-18 MED ORDER — ONDANSETRON HCL 4 MG/2ML IJ SOLN: 4.0000 mg | Freq: Once | INTRAMUSCULAR | Status: DC | PRN

## 2018-10-18 MED ORDER — NAPROXEN 500 MG PO TABS: 500.0000 mg | ORAL_TABLET | Freq: Two times a day (BID) | ORAL | Status: DC | PRN

## 2018-10-18 MED ORDER — ONDANSETRON 4 MG PO TBDP: 4.0000 mg | ORAL_TABLET | Freq: Four times a day (QID) | ORAL | Status: DC | PRN

## 2018-10-18 MED ORDER — MIDAZOLAM HCL 2 MG/2ML IJ SOLN
INTRAMUSCULAR | Status: AC
  Filled 2018-10-18: qty 2

## 2018-10-18 MED ORDER — ACETAMINOPHEN 500 MG PO TABS
ORAL_TABLET | ORAL | Status: AC
  Filled 2018-10-18: qty 2

## 2018-10-18 MED ORDER — ONDANSETRON HCL 4 MG/2ML IJ SOLN: 4.0000 mg | Freq: Four times a day (QID) | INTRAMUSCULAR | Status: DC | PRN

## 2018-10-18 MED ORDER — KCL IN DEXTROSE-NACL 20-5-0.45 MEQ/L-%-% IV SOLN
INTRAVENOUS | Status: DC
  Administered 2018-10-18: 12:00:00 via INTRAVENOUS
  Filled 2018-10-18: qty 1000

## 2018-10-18 MED ORDER — SODIUM CHLORIDE 0.9 % IV SOLN
INTRAVENOUS | Status: DC | PRN
  Administered 2018-10-18: 500 mL

## 2018-10-18 MED ORDER — EPHEDRINE SULFATE 50 MG/ML IJ SOLN
INTRAMUSCULAR | Status: DC | PRN
  Administered 2018-10-18: 10 mg via INTRAVENOUS

## 2018-10-18 MED ORDER — ACETAMINOPHEN 325 MG PO TABS: 325.0000 mg | ORAL_TABLET | Freq: Four times a day (QID) | ORAL | Status: DC

## 2018-10-18 MED ORDER — OXYCODONE HCL 5 MG/5ML PO SOLN
5.0000 mg | Freq: Once | ORAL | Status: DC | PRN
Start: 2018-10-18 — End: 2018-10-19

## 2018-10-18 MED ORDER — POLYETHYLENE GLYCOL 3350 17 G PO PACK: 17.0000 g | PACK | Freq: Every day | ORAL | Status: DC | PRN

## 2018-10-18 MED ORDER — OXYCODONE HCL 5 MG PO TABS: 5.0000 mg | ORAL_TABLET | Freq: Once | ORAL | Status: DC | PRN

## 2018-10-18 MED ORDER — CEFAZOLIN SODIUM-DEXTROSE 2-4 GM/100ML-% IV SOLN
INTRAVENOUS | Status: AC
  Filled 2018-10-18: qty 100

## 2018-10-18 MED ORDER — HYDROMORPHONE HCL 1 MG/ML IJ SOLN: 1.0000 mg | INTRAMUSCULAR | Status: DC | PRN

## 2018-10-18 MED ORDER — GABAPENTIN 300 MG PO CAPS
300.0000 mg | ORAL_CAPSULE | ORAL | Status: AC
  Administered 2018-10-18: 300 mg via ORAL

## 2018-10-18 MED ORDER — FENTANYL CITRATE (PF) 100 MCG/2ML IJ SOLN
50.0000 ug | INTRAMUSCULAR | Status: DC | PRN
  Administered 2018-10-18: 100 ug via INTRAVENOUS

## 2018-10-18 MED ORDER — SUFENTANIL CITRATE 50 MCG/ML IV SOLN
INTRAVENOUS | Status: DC | PRN
  Administered 2018-10-18 (×2): 5 ug via INTRAVENOUS

## 2018-10-18 MED ORDER — LIDOCAINE HCL (CARDIAC) PF 100 MG/5ML IV SOSY
PREFILLED_SYRINGE | INTRAVENOUS | Status: DC | PRN
  Administered 2018-10-18: 50 mg via INTRAVENOUS

## 2018-10-18 MED ORDER — OXYCODONE HCL 5 MG PO TABS
5.0000 mg | ORAL_TABLET | Freq: Once | ORAL | Status: DC | PRN
Start: 2018-10-18 — End: 2018-10-19

## 2018-10-18 MED ORDER — LACTATED RINGERS IV SOLN
INTRAVENOUS | Status: DC
  Administered 2018-10-18 (×2): via INTRAVENOUS

## 2018-10-18 MED ORDER — TECHNETIUM TC 99M SULFUR COLLOID FILTERED
1.0000 | Freq: Once | INTRAVENOUS | Status: AC | PRN
  Administered 2018-10-18: 1 via INTRADERMAL

## 2018-10-18 MED ORDER — CEFAZOLIN SODIUM-DEXTROSE 2-4 GM/100ML-% IV SOLN: 2.0000 g | INTRAVENOUS | Status: AC

## 2018-10-18 MED ORDER — ONDANSETRON HCL 4 MG PO TABS: 4.0000 mg | ORAL_TABLET | Freq: Three times a day (TID) | ORAL | 0 refills | Status: AC | PRN

## 2018-10-18 MED ORDER — MIDAZOLAM HCL 2 MG/2ML IJ SOLN
1.0000 mg | INTRAMUSCULAR | Status: DC | PRN
  Administered 2018-10-18 (×2): 2 mg via INTRAVENOUS

## 2018-10-18 MED ORDER — BUPIVACAINE-EPINEPHRINE (PF) 0.5% -1:200000 IJ SOLN
INTRAMUSCULAR | Status: DC | PRN
  Administered 2018-10-18: 30 mL

## 2018-10-18 MED ORDER — CEFAZOLIN SODIUM-DEXTROSE 2-4 GM/100ML-% IV SOLN
2.0000 g | Freq: Three times a day (TID) | INTRAVENOUS | Status: DC
  Administered 2018-10-18 – 2018-10-19 (×3): 2 g via INTRAVENOUS
  Filled 2018-10-18 (×2): qty 100

## 2018-10-18 MED ORDER — DIPHENHYDRAMINE HCL 12.5 MG/5ML PO ELIX: 12.5000 mg | ORAL_SOLUTION | Freq: Four times a day (QID) | ORAL | Status: DC | PRN

## 2018-10-18 MED ORDER — CEFAZOLIN SODIUM-DEXTROSE 2-4 GM/100ML-% IV SOLN
2.0000 g | INTRAVENOUS | Status: AC
  Administered 2018-10-18: 08:00:00 2 g via INTRAVENOUS

## 2018-10-18 MED ORDER — GABAPENTIN 300 MG PO CAPS
ORAL_CAPSULE | ORAL | Status: AC
  Filled 2018-10-18: qty 1

## 2018-10-18 MED ORDER — ONDANSETRON HCL 4 MG/2ML IJ SOLN
INTRAMUSCULAR | Status: DC | PRN
  Administered 2018-10-18: 4 mg via INTRAVENOUS

## 2018-10-18 MED ORDER — ACETAMINOPHEN 160 MG/5ML PO SOLN: 325.0000 mg | ORAL | Status: DC | PRN

## 2018-10-18 MED ORDER — HYDROCODONE-ACETAMINOPHEN 5-325 MG PO TABS: 1.0000 | ORAL_TABLET | Freq: Four times a day (QID) | ORAL | 0 refills | Status: DC | PRN

## 2018-10-18 MED ORDER — PROPOFOL 10 MG/ML IV BOLUS
INTRAVENOUS | Status: DC | PRN
  Administered 2018-10-18: 200 mg via INTRAVENOUS

## 2018-10-18 SURGICAL SUPPLY — 86 items
ADH SKN CLS APL DERMABOND .7 (GAUZE/BANDAGES/DRESSINGS) ×1
APL PRP STRL LF DISP 70% ISPRP (MISCELLANEOUS) ×2
APPLIER CLIP 9.375 MED OPEN (MISCELLANEOUS) ×3
APR CLP MED 9.3 20 MLT OPN (MISCELLANEOUS) ×1
BAG DECANTER FOR FLEXI CONT (MISCELLANEOUS) ×3 IMPLANT
BINDER BREAST LRG (GAUZE/BANDAGES/DRESSINGS) ×2 IMPLANT
BINDER BREAST MEDIUM (GAUZE/BANDAGES/DRESSINGS) IMPLANT
BINDER BREAST XLRG (GAUZE/BANDAGES/DRESSINGS) IMPLANT
BINDER BREAST XXLRG (GAUZE/BANDAGES/DRESSINGS) IMPLANT
BIOPATCH RED 1 DISK 7.0 (GAUZE/BANDAGES/DRESSINGS) ×2 IMPLANT
BIOPATCH RED 1IN DISK 7.0MM (GAUZE/BANDAGES/DRESSINGS) ×1
BLADE HEX COATED 2.75 (ELECTRODE) ×3 IMPLANT
BLADE SURG 10 STRL SS (BLADE) ×3 IMPLANT
BLADE SURG 15 STRL LF DISP TIS (BLADE) ×2 IMPLANT
BLADE SURG 15 STRL SS (BLADE) ×12
BNDG GAUZE ELAST 4 BULKY (GAUZE/BANDAGES/DRESSINGS) ×6 IMPLANT
CANISTER SUCT 1200ML W/VALVE (MISCELLANEOUS) ×4 IMPLANT
CHLORAPREP W/TINT 26 (MISCELLANEOUS) ×6 IMPLANT
CLIP APPLIE 9.375 MED OPEN (MISCELLANEOUS) ×1 IMPLANT
COVER BACK TABLE REUSABLE LG (DRAPES) ×4 IMPLANT
COVER MAYO STAND REUSABLE (DRAPES) ×4 IMPLANT
COVER PROBE W GEL 5X96 (DRAPES) ×3 IMPLANT
COVER WAND RF STERILE (DRAPES) IMPLANT
DECANTER SPIKE VIAL GLASS SM (MISCELLANEOUS) IMPLANT
DERMABOND ADVANCED (GAUZE/BANDAGES/DRESSINGS) ×2
DERMABOND ADVANCED .7 DNX12 (GAUZE/BANDAGES/DRESSINGS) ×1 IMPLANT
DEVICE DISSECT PLASMABLAD 3.0S (MISCELLANEOUS) ×1 IMPLANT
DRAIN CHANNEL 19F RND (DRAIN) ×4 IMPLANT
DRAPE LAPAROSCOPIC ABDOMINAL (DRAPES) ×4 IMPLANT
DRAPE SURG 17X23 STRL (DRAPES) ×12 IMPLANT
DRAPE UTILITY XL STRL (DRAPES) ×3 IMPLANT
DRSG PAD ABDOMINAL 8X10 ST (GAUZE/BANDAGES/DRESSINGS) ×6 IMPLANT
ELECT BLADE 4.0 EZ CLEAN MEGAD (MISCELLANEOUS) ×3
ELECT COATED BLADE 2.86 ST (ELECTRODE) ×3 IMPLANT
ELECT REM PT RETURN 9FT ADLT (ELECTROSURGICAL) ×3
ELECTRODE BLDE 4.0 EZ CLN MEGD (MISCELLANEOUS) ×1 IMPLANT
ELECTRODE REM PT RTRN 9FT ADLT (ELECTROSURGICAL) ×2 IMPLANT
EVACUATOR SILICONE 100CC (DRAIN) ×4 IMPLANT
GAUZE SPONGE 4X4 12PLY STRL LF (GAUZE/BANDAGES/DRESSINGS) ×3 IMPLANT
GLOVE BIO SURGEON STRL SZ 6.5 (GLOVE) ×5 IMPLANT
GLOVE BIO SURGEON STRL SZ7.5 (GLOVE) ×3 IMPLANT
GLOVE BIO SURGEONS STRL SZ 6.5 (GLOVE) ×3
GOWN STRL REUS W/ TWL LRG LVL3 (GOWN DISPOSABLE) ×5 IMPLANT
GOWN STRL REUS W/TWL LRG LVL3 (GOWN DISPOSABLE) ×12
GRAFT FLEX HD 4X16 THICK (Tissue Mesh) ×2 IMPLANT
ILLUMINATOR WAVEGUIDE N/F (MISCELLANEOUS) IMPLANT
IMPL BREAST 300CC (Breast) IMPLANT
IMPLANT BREAST 300CC (Breast) ×3 IMPLANT
IV NS 1000ML (IV SOLUTION)
IV NS 1000ML BAXH (IV SOLUTION) IMPLANT
IV NS 500ML (IV SOLUTION)
IV NS 500ML BAXH (IV SOLUTION) ×1 IMPLANT
KIT FILL SYSTEM UNIVERSAL (SET/KITS/TRAYS/PACK) IMPLANT
LIGHT WAVEGUIDE WIDE FLAT (MISCELLANEOUS) IMPLANT
NDL HYPO 25X1 1.5 SAFETY (NEEDLE) ×1 IMPLANT
NEEDLE HYPO 25X1 1.5 SAFETY (NEEDLE) ×9 IMPLANT
NS IRRIG 1000ML POUR BTL (IV SOLUTION) ×1 IMPLANT
PACK BASIN DAY SURGERY FS (CUSTOM PROCEDURE TRAY) ×6 IMPLANT
PENCIL BUTTON HOLSTER BLD 10FT (ELECTRODE) ×4 IMPLANT
PIN SAFETY STERILE (MISCELLANEOUS) ×4 IMPLANT
PLASMABLADE 3.0S (MISCELLANEOUS)
SET ASEPTIC TRANSFER (MISCELLANEOUS) ×2 IMPLANT
SLEEVE SCD COMPRESS KNEE MED (MISCELLANEOUS) ×4 IMPLANT
SPONGE LAP 18X18 RF (DISPOSABLE) ×9 IMPLANT
STRIP SUTURE WOUND CLOSURE 1/2 (SUTURE) IMPLANT
SUT ETHILON 2 0 FS 18 (SUTURE) ×1 IMPLANT
SUT MNCRL AB 4-0 PS2 18 (SUTURE) ×5 IMPLANT
SUT MON AB 3-0 SH 27 (SUTURE) ×6
SUT MON AB 3-0 SH27 (SUTURE) ×1 IMPLANT
SUT MON AB 5-0 PS2 18 (SUTURE) ×5 IMPLANT
SUT PDS 3-0 CT2 (SUTURE)
SUT PDS AB 2-0 CT2 27 (SUTURE) ×6 IMPLANT
SUT PDS II 3-0 CT2 27 ABS (SUTURE) IMPLANT
SUT SILK 2 0 SH (SUTURE) IMPLANT
SUT SILK 3 0 PS 1 (SUTURE) ×3 IMPLANT
SUT VIC AB 3-0 SH 27 (SUTURE)
SUT VIC AB 3-0 SH 27X BRD (SUTURE) IMPLANT
SUT VICRYL 3-0 CR8 SH (SUTURE) ×2 IMPLANT
SYR BULB IRRIGATION 50ML (SYRINGE) ×3 IMPLANT
SYR CONTROL 10ML LL (SYRINGE) ×5 IMPLANT
TOWEL GREEN STERILE FF (TOWEL DISPOSABLE) ×9 IMPLANT
TRAY FOLEY W/BAG SLVR 14FR LF (SET/KITS/TRAYS/PACK) IMPLANT
TUBE CONNECTING 20'X1/4 (TUBING) ×1
TUBE CONNECTING 20X1/4 (TUBING) ×3 IMPLANT
UNDERPAD 30X30 (UNDERPADS AND DIAPERS) ×6 IMPLANT
YANKAUER SUCT BULB TIP NO VENT (SUCTIONS) ×6 IMPLANT

## 2018-10-18 NOTE — Anesthesia Procedure Notes (Signed)
Procedure Name: LMA Insertion Date/Time: 10/18/2018 8:29 AM Performed by: Willa Frater, CRNA Pre-anesthesia Checklist: Patient identified, Emergency Drugs available, Suction available and Patient being monitored Patient Re-evaluated:Patient Re-evaluated prior to induction Oxygen Delivery Method: Circle system utilized Preoxygenation: Pre-oxygenation with 100% oxygen Induction Type: IV induction Ventilation: Mask ventilation without difficulty LMA: LMA inserted LMA Size: 3.0 Number of attempts: 1 Airway Equipment and Method: Bite block Placement Confirmation: positive ETCO2 Tube secured with: Tape Dental Injury: Teeth and Oropharynx as per pre-operative assessment

## 2018-10-18 NOTE — Progress Notes (Signed)
Assisted Dr. Oddono with left, ultrasound guided, pectoralis block. Side rails up, monitors on throughout procedure. See vital signs in flow sheet. Tolerated Procedure well. °

## 2018-10-18 NOTE — Transfer of Care (Signed)
Immediate Anesthesia Transfer of Care Note  Patient: [name]  Procedure(s) Performed: LEFT MASTECTOMY WITH SENTINEL LYMPH NODE BIOPSY (Left Breast) IMMEDIATEVBREAST RECONSTRUCTION WITH PLACEMENT OF TISSUE EXPANDER AND FLEX HD (ACELLULAR HYDRATED DERMIS) (Left Breast)  Patient Location: PACU  Anesthesia Type:GA combined with regional for post-op pain  Level of Consciousness: sedated  Airway & Oxygen Therapy: Patient Spontanous Breathing and Patient connected to face mask oxygen  Post-op Assessment: Report given to RN and Post -op Vital signs reviewed and stable  Post vital signs: Reviewed and stable  Last Vitals:  Vitals Value Taken Time  BP 107/49 10/18/2018 10:53 AM  Temp    Pulse 97 10/18/2018 10:54 AM  Resp 12 10/18/2018 10:54 AM  SpO2 100 % 10/18/2018 10:54 AM  Vitals shown include unvalidated device data.  Last Pain:  Vitals:   10/18/18 0714  TempSrc: Oral  PainSc: 0-No pain         Complications: No apparent anesthesia complications

## 2018-10-18 NOTE — Op Note (Addendum)
10/18/2018  10:38 AM  PATIENT:  [name]  48 y.o. female  PRE-OPERATIVE DIAGNOSIS:  LEFT BREAST CANCER  POST-OPERATIVE DIAGNOSIS:  LEFT BREAST CANCER  PROCEDURE:  Procedure(s): LEFT MASTECTOMY WITH DEEP LEFT AXILLARY SENTINEL LYMPH NODE BIOPSY (Left)  SURGEON:  Surgeon(s) and Role: Panel 1:    * Jovita Kussmaul, MD - Primary    * Gosai, Puja, PA-C - Assisting  PHYSICIAN ASSISTANT:   ASSISTANTS: Carlena Hurl, PA   ANESTHESIA:   general  EBL:  5 mL   BLOOD ADMINISTERED:none  DRAINS: none   LOCAL MEDICATIONS USED:  NONE  SPECIMEN:  Source of Specimen:  left mastectomy and sentinel nodes X 6  DISPOSITION OF SPECIMEN:  PATHOLOGY  COUNTS:  YES  TOURNIQUET:  * No tourniquets in log *  DICTATION: .Dragon Dictation   After informed consent was obtained the patient was brought to the operating room and placed in the supine position on the operating table.  After adequate induction of general anesthesia the patient's bilateral chest, breast, and axillary areas were prepped with ChloraPrep, allowed to dry, and draped in usual sterile manner.  An appropriate timeout was performed.  Earlier in the day the patient underwent injection of 1 mCi of technetium sulfur colloid in the subareolar position on the left.  The neoprobe was set to technetium in an area of radioactivity was readily identified in the left axilla.  An elliptical incision was made around the nipple and areole a complex with a 15 blade knife in a manner to try to include the palpable cancer.  The incision was carried through the skin and subcutaneous tissue sharply with the electrocautery.  Next skin hooks were used to elevate the skin flaps anteriorly towards the saline.  Thin skin flaps were then created by dissection with electrocautery between the breast tissue and the subcutaneous fat.  This dissection was carried all the way to the chest wall.  Next the breast was removed from the pectoralis muscle with the  pectoralis fascia.  Once this was accomplished then the breast was removed from the patient and marked with a stitch on the lateral skin.  The neoprobe was then used to direct sharp dissection into the deep left axillary space.  Once the space was entered then blunt hemostat dissection was carried out.  I was able to identify 6 hot lymph nodes.  These were all excised sharply with electrocautery and the lymphatics and small vessels were controlled with clips.  Ex vivo counts on these nodes ranged from 200 to 800.  These were sent as sentinel nodes numbers 1 through 6.  No other hot or palpable lymph nodes are identified in the left axilla.  Hemostasis was achieved using the Bovie electrocautery.  At this point the patient tolerated the procedure well.  All needle sponge and instrument counts were correct.  The operation was then turned over to Dr. [name] for the reconstruction. Her portion will be dictated separately.  The assistant was crucial for retraction and visualization during the case  PLAN OF CARE: Admit for overnight observation  PATIENT DISPOSITION:  PACU - hemodynamically stable.   Delay start of Pharmacological VTE agent (>24hrs) due to surgical blood loss or risk of bleeding: no

## 2018-10-18 NOTE — Interval H&P Note (Signed)
History and Physical Interval Note:  10/18/2018 8:05 AM  [name]  has presented today for surgery, with the diagnosis of LEFT BREAST CANCER.  The various methods of treatment have been discussed with the patient and family. After consideration of risks, benefits and other options for treatment, the patient has consented to  Procedure(s): LEFT MASTECTOMY WITH SENTINEL LYMPH NODE BIOPSY (Left) IMMEDIATEVBREAST RECONSTRUCTION WITH PLACEMENT OF TISSUE EXPANDER AND FLEX HD (ACELLULAR HYDRATED DERMIS) (Left) as a surgical intervention.  The patient's history has been reviewed, patient examined, no change in status, stable for surgery.  I have reviewed the patient's chart and labs.  Questions were answered to the patient's satisfaction.     Autumn Messing III

## 2018-10-18 NOTE — Addendum Note (Signed)
Addended by: Wallace Going on: 10/18/2018 11:10 AM   Modules accepted: Orders

## 2018-10-18 NOTE — Op Note (Signed)
Op report    DATE OF OPERATION:  10/18/2018  LOCATION: [place]  SURGICAL DIVISION: Plastic Surgery  PREOPERATIVE DIAGNOSES:  1. Left reast cancer.    POSTOPERATIVE DIAGNOSES:  1. Left Breast cancer.   PROCEDURE:  1. left immediate breast reconstruction with placement of Acellular Dermal Matrix and tissue expanders.  SURGEON: Tinlee Navarrette Sanger Tesla Bochicchio, DO  ANESTHESIA:  General.   COMPLICATIONS: None.   IMPLANTS: Right - Mentor 300 cc. Ref #[id number].  Serial Number [id number], no injectable saline placed in the expander. Acellular Dermal Matrix 4 x 16 cm Flex HD  INDICATIONS FOR PROCEDURE:  The patient, [name], is a 48 y.o. female born on 06/03/1971, is here for immediate first stage breast reconstruction with placement of left tissue expander and Acellular dermal matrix. MRN: [id number]  CONSENT:  Informed consent was obtained directly from the patient. Risks, benefits and alternatives were fully discussed. Specific risks including but not limited to bleeding, infection, hematoma, seroma, scarring, pain, implant infection, implant extrusion, capsular contracture, asymmetry, wound healing problems, and need for further surgery were all discussed. The patient did have an ample opportunity to have her questions answered to her satisfaction.   DESCRIPTION OF PROCEDURE:  The patient was taken to the operating room by the general surgery team. SCDs were placed and IV antibiotics were given. The patient's chest was prepped and draped in a sterile fashion. A time out was performed and the implants to be used were identified.  Left mastectomy was performed.  Once the general surgery team had completed their portion of they case the patient was rendered to the plastic and reconstructive surgery team.  Left:  The pectoralis major muscle was lifted from the chest wall with release of the lateral edge and lateral inframammary fold.  The pocket was irrigated  with antibiotic solution and hemostasis was achieved with electrocautery.  The ADM was then prepared according to the manufacture guidelines and slits placed to help with postoperative fluid management.  The ADM was then sutured to the inferior and lateral edge of the inframammary fold with 2-0 PDS starting with an interrupted stitch and then a running stitch.  The lateral portion was sutured to with interrupted sutures after the expander was placed.  The expander was prepared according to the manufacture guidelines, the air evacuated and then it was placed under the ADM and pectoralis major muscle.  The inferior and lateral tabs were used to secure the expander to the chest wall with 2-0 PDS.  The drain was placed at the inframammary fold over the ADM and secured to the skin with 3-0 Silk.    The deep layers were closed with 3-0 Monocryl followed by 4-0 Monocryl.  The skin was closed with 5-0 Monocryl and then dermabond was applied.  The ABDs and breast binder were placed.  The patient tolerated the procedure well and there were no complications.  The patient was allowed to wake from anesthesia and taken to the recovery room in satisfactory condition.

## 2018-10-18 NOTE — Anesthesia Postprocedure Evaluation (Signed)
Anesthesia Post Note  Patient: [name]  Procedure(s) Performed: LEFT MASTECTOMY WITH SENTINEL LYMPH NODE BIOPSY (Left Breast) IMMEDIATEVBREAST RECONSTRUCTION WITH PLACEMENT OF TISSUE EXPANDER AND FLEX HD (ACELLULAR HYDRATED DERMIS) (Left Breast)     Patient location during evaluation: PACU Anesthesia Type: General Level of consciousness: awake and alert Pain management: pain level controlled Vital Signs Assessment: post-procedure vital signs reviewed and stable Respiratory status: spontaneous breathing, nonlabored ventilation, respiratory function stable and patient connected to nasal cannula oxygen Cardiovascular status: blood pressure returned to baseline and stable Postop Assessment: no apparent nausea or vomiting Anesthetic complications: no    Last Vitals:  Vitals:   10/18/18 1130 10/18/18 1145  BP: 115/76 121/79  Pulse: 83 82  Resp: 14 19  Temp:    SpO2: 100% 100%    Last Pain:  Vitals:   10/18/18 1130  TempSrc:   PainSc: 4                  Jakhi Dishman

## 2018-10-18 NOTE — Anesthesia Procedure Notes (Signed)
Anesthesia Regional Block: Pectoralis block   Pre-Anesthetic Checklist: ,, timeout performed, Correct Patient, Correct Site, Correct Laterality, Correct Procedure, Correct Position, site marked, Risks and benefits discussed,  Surgical consent,  Pre-op evaluation,  At surgeon's request and post-op pain management  Laterality: Left  Prep: chloraprep       Needles:  Injection technique: Single-shot  Needle Type: Echogenic Stimulator Needle     Needle Length: 5cm  Needle Gauge: 22     Additional Needles:   Procedures:, nerve stimulator,,, ultrasound used (permanent image in chart),,,,   Nerve Stimulator or Paresthesia:  Response: 0.45 mA,   Additional Responses:   Narrative:  Start time: 10/18/2018 8:04 AM End time: 10/18/2018 8:10 AM Injection made incrementally with aspirations every 5 mL.  Performed by: Personally  Anesthesiologist: Janeece Riggers, MD  Additional Notes: Functioning IV was confirmed and monitors were applied.  A 65mm 22ga Arrow echogenic stimulator needle was used. Sterile prep and drape,hand hygiene and sterile gloves were used. Ultrasound guidance: relevant anatomy identified, needle position confirmed, local anesthetic spread visualized around nerve(s)., vascular puncture avoided.  Image printed for medical record. Negative aspiration and negative test dose prior to incremental administration of local anesthetic. The patient tolerated the procedure well.

## 2018-10-18 NOTE — Interval H&P Note (Signed)
History and Physical Interval Note:  10/18/2018 8:59 AM  [name]  has presented today for surgery, with the diagnosis of LEFT BREAST CANCER.  The various methods of treatment have been discussed with the patient and family. After consideration of risks, benefits and other options for treatment, the patient has consented to  Procedure(s): LEFT MASTECTOMY WITH SENTINEL LYMPH NODE BIOPSY (Left) IMMEDIATEVBREAST RECONSTRUCTION WITH PLACEMENT OF TISSUE EXPANDER AND FLEX HD (ACELLULAR HYDRATED DERMIS) (Left) as a surgical intervention.  The patient's history has been reviewed, patient examined, no change in status, stable for surgery.  I have reviewed the patient's chart and labs.  Questions were answered to the patient's satisfaction.     Loel Lofty Dillingham

## 2018-10-18 NOTE — H&P (Signed)
[place]  Location: [facility] Patient #: [id number] DOB: 28-Sep-1970 Undefined / Language: Cleophus Molt / Race: White Female   History of Present Illness  The patient is a 48 year old female who presents with breast cancer. We are asked to see the patient in consultation by Dr. Jana Hakim to evaluate her for a new left breast cancer. The patient is a 48 year old white female who presents with a palpable mass in the central left breast. It measured about 5cm. 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 She does not smoke   Past Surgical History  Breast Biopsy  Left. Oral Surgery   Diagnostic Studies History  Colonoscopy  never Mammogram  within last year Pap Smear  1-5 years ago  Medication History Medications Reconciled  Social History  Alcohol use  Occasional alcohol use. Caffeine use  Carbonated beverages, Coffee, Tea. No drug use  Tobacco use  Never smoker.  Family History  Heart Disease  Father. Hypertension  Father. Thyroid problems  Father.  Pregnancy / Birth History  Age at menarche  28 years. Contraceptive History  Intrauterine device. Gravida  2 Irregular periods  Length (months) of breastfeeding  3-6 Maternal age  58-35 Para  2  Other Problems  Lump In Breast     Review of Systems  General Present- Night Sweats. Not Present- Appetite Loss, Chills, Fatigue, Fever, Weight Gain and Weight Loss. Skin Not Present- Change in Wart/Mole, Dryness, Hives, Jaundice, New Lesions, Non-Healing Wounds, Rash and Ulcer. HEENT Not Present- Earache, Hearing Loss, Hoarseness, Nose Bleed, Oral Ulcers, Ringing in the Ears, Seasonal Allergies, Sinus Pain, Sore Throat, Visual Disturbances, Wears glasses/contact lenses and Yellow Eyes. Respiratory Not Present- Bloody sputum, Chronic Cough, Difficulty Breathing, Snoring and Wheezing. Breast Present- Breast Mass. Not Present-  Breast Pain, Nipple Discharge and Skin Changes. Cardiovascular Not Present- Chest Pain, Difficulty Breathing Lying Down, Leg Cramps, Palpitations, Rapid Heart Rate, Shortness of Breath and Swelling of Extremities. Gastrointestinal Not Present- Abdominal Pain, Bloating, Bloody Stool, Change in Bowel Habits, Chronic diarrhea, Constipation, Difficulty Swallowing, Excessive gas, Gets full quickly at meals, Hemorrhoids, Indigestion, Nausea, Rectal Pain and Vomiting. Female Genitourinary Not Present- Frequency, Nocturia, Painful Urination, Pelvic Pain and Urgency. Musculoskeletal Not Present- Back Pain, Joint Pain, Joint Stiffness, Muscle Pain, Muscle Weakness and Swelling of Extremities. Neurological Not Present- Decreased Memory, Fainting, Headaches, Numbness, Seizures, Tingling, Tremor, Trouble walking and Weakness. Psychiatric Not Present- Anxiety, Bipolar, Change in Sleep Pattern, Depression, Fearful and Frequent crying. Endocrine Not Present- Cold Intolerance, Excessive Hunger, Hair Changes, Heat Intolerance, Hot flashes and New Diabetes. Hematology Not Present- Blood Thinners, Easy Bruising, Excessive bleeding, Gland problems, HIV and Persistent Infections.   Physical Exam  General Mental Status-Alert. General Appearance-Consistent with stated age. Hydration-Well hydrated. Voice-Normal.  Head and Neck Head-normocephalic, atraumatic with no lesions or palpable masses. Trachea-midline. Thyroid Gland Characteristics - normal size and consistency.  Eye Eyeball - Bilateral-Extraocular movements intact. Sclera/Conjunctiva - Bilateral-No scleral icterus.  Chest and Lung Exam Chest and lung exam reveals -quiet, even and easy respiratory effort with no use of accessory muscles and on auscultation, normal breath sounds, no adventitious sounds and normal vocal resonance. Inspection Chest Wall - Normal. Back - normal.  Breast Note: There is a 5cm palpable mass in the central  left breast. There is a 3cm area of density in the central right breast. There is no palpable axillary, supraclavicular, or cervical  lymphadenopathy   Cardiovascular Cardiovascular examination reveals -normal heart sounds, regular rate and rhythm with no murmurs and normal pedal pulses bilaterally.  Abdomen Inspection Inspection of the abdomen reveals - No Hernias. Skin - Scar - no surgical scars. Palpation/Percussion Palpation and Percussion of the abdomen reveal - Soft, Non Tender, No Rebound tenderness, No Rigidity (guarding) and No hepatosplenomegaly. Auscultation Auscultation of the abdomen reveals - Bowel sounds normal.  Neurologic Neurologic evaluation reveals -alert and oriented x 3 with no impairment of recent or remote memory. Mental Status-Normal.  Musculoskeletal Normal Exam - Left-Upper Extremity Strength Normal and Lower Extremity Strength Normal. Normal Exam - Right-Upper Extremity Strength Normal and Lower Extremity Strength Normal.  Lymphatic Head & Neck  General Head & Neck Lymphatics: Bilateral - Description - Normal. Axillary  General Axillary Region: Bilateral - Description - Normal. Tenderness - Non Tender. Femoral & Inguinal  Generalized Femoral & Inguinal Lymphatics: Bilateral - Description - Normal. Tenderness - Non Tender.    Assessment & Plan  MALIGNANT NEOPLASM OF CENTRAL PORTION OF LEFT BREAST IN FEMALE, ESTROGEN RECEPTOR POSITIVE (C50.112) Impression: The patient appears to have a large lobular cancer in the central left breast. She will need an MRI because of the lobular nature. Because of the size she will need a mastectomy and sentinel node biopsy. I have discussed with her the risks and benefits of the surgery and she understands and wishes to proceed. She is interested in reconstruction so I will refer her to Dr. [name]

## 2018-10-19 ENCOUNTER — Encounter (HOSPITAL_BASED_OUTPATIENT_CLINIC_OR_DEPARTMENT_OTHER): Payer: Self-pay | Admitting: General Surgery

## 2018-10-19 DIAGNOSIS — C50112 Malignant neoplasm of central portion of left female breast: Secondary | ICD-10-CM | POA: Diagnosis not present

## 2018-10-19 MED ORDER — CEFAZOLIN SODIUM-DEXTROSE 2-4 GM/100ML-% IV SOLN
INTRAVENOUS | Status: AC
  Filled 2018-10-19: qty 100

## 2018-10-19 NOTE — Discharge Instructions (Signed)
Information for Discharge Teaching: EXPAREL (bupivacaine liposome injectable suspension)   Your surgeon or anesthesiologist gave you EXPAREL(bupivacaine) to help control your pain after surgery.   EXPAREL is a local anesthetic that provides pain relief by numbing the tissue around the surgical site.  EXPAREL is designed to release pain medication over time and can control pain for up to 72 hours.  Depending on how you respond to EXPAREL, you may require less pain medication during your recovery.  Possible side effects:  Temporary loss of sensation or ability to move in the area where bupivacaine was injected.  Nausea, vomiting, constipation  Rarely, numbness and tingling in your mouth or lips, lightheadedness, or anxiety may occur.  Call your doctor right away if you think you may be experiencing any of these sensations, or if you have other questions regarding possible side effects.  Follow all other discharge instructions given to you by your surgeon or nurse. Eat a healthy diet and drink plenty of water or other fluids.  If you return to the hospital for any reason within 96 hours following the administration of EXPAREL, it is important for health care providers to know that you have received this anesthetic. A teal colored band has been placed on your arm with the date, time and amount of EXPAREL you have received in order to alert and inform your health care providers. Please leave this armband in place for the full 96 hours following administration, and then you may remove the band.    JP Drain [employer] this sheet to all of your post-operative appointments while you have your drains.  Please measure your drains by CC's or ML's.  Make sure you drain and measure your JP Drains 2 or 3 times per day.  At the end of each day, add up totals for the left side and add up totals for the right side.    ( 9 am )     ( 3 pm )        ( 9 pm )                Date L  R  L  R  L  R   Total L/R                                                                                                                                                                                       INSTRUCTIONS FOR AFTER BREAST SURGERY   You are getting ready to undergo breast surgery.  You will likely have some questions about what to expect following your operation.  The following information will help you and  your family understand what to expect when you are discharged from the hospital.  Following these guidelines will help ensure a smooth recovery and reduce risks of complications.   Postoperative instructions include information on: diet, wound care, medications and physical activity.  AFTER SURGERY Expect to go home after the procedure.  In some cases, you may need to spend one night in the hospital for observation.  DIET Breast surgery does not require a specific diet.  However, I have to mention that the healthier you eat the better your body can start healing. It is important to increasing your protein intake.  This means limiting the foods with sugar and carbohydrates.  Focus on vegetables and some meat.  If you have any liposuction during your procedure be sure to drink water.  If your urine is bright yellow, then it is concentrated, and you need to drink more water.  As a general rule after surgery, you should have 8 ounces of water every hour while awake.  If you find you are persistently nauseated or unable to take in liquids let us know.  NO TOBACCO USE or EXPOSURE.  This will slow your healing process and increase the risk of a wound.  WOUND CARE You can shower the day after surgery if you don't have a drain.  Use fragrance free soap.  Dial, [place] and [place] are usually mild on the skin. If you have a drain clean with baby wipes until the drain is removed.  If you have steri-strips / tape directly attached to your skin leave them in place. It is OK to get these wet.  No baths,  pools or hot tubs for two weeks. We close your incision to leave the smallest and best-looking scar. No ointment or creams on your incisions until given the go ahead.  Especially not Neosporin (Too many skin reactions with this one).  A few weeks after surgery you can use Mederma and start massaging the scar. We ask you to wear your binder or sports bra for the first 6 weeks around the clock, including while sleeping. This provides added comfort and helps reduce the fluid accumulation at the surgery site.  ACTIVITY No heavy lifting until cleared by the doctor.  This usually means no more than a half-gallon of milk.  It is OK to walk and climb stairs. In fact, moving your legs is very important to decrease your risk of a blood clot.  It will also help keep you from getting deconditioned.  Every 1 to 2 hours get up and walk for 5 minutes. This will help with a quicker recovery back to normal.  Let pain be your guide so you don't do too much.  NO, you cannot do the spring cleaning and don't plan on taking care of anyone else.  This is your time for TLC.  You will be more comfortable if you sleep and rest with your head elevated either with a few pillows under you or in a recliner.  No stomach sleeping for a few months.  WORK Everyone returns to work at different times. As a rough guide, most people take at least 1 - 2 weeks off prior to returning to work. If you need documentation for your job, bring the forms to your postoperative follow up visit.  DRIVING Arrange for someone to bring you home from the hospital.  You may be able to drive a few days after surgery but not while taking any narcotics or valium.  BOWEL MOVEMENTS  Constipation can occur after anesthesia and while taking pain medication.  It is important to stay ahead for your comfort.  We recommend taking Milk of Magnesia (2 tablespoons; twice a day) while taking the pain pills.  SEROMA This is fluid your body tried to put in the surgical  site.  This is normal but if it creates tight skinny skin let us know.  It usually decreases in a few weeks.  WHEN TO CALL Call your surgeon's office if any of the following occur:  Fever 101 degrees F or greater  Excessive bleeding or fluid from the incision site.  Pain that increases over time without aid from the medications  Redness, warmth, or pus draining from incision sites  Persistent nausea or inability to take in liquids  Severe misshapen area that underwent the operation.  Here are some resources:  1. Plastic surgery website: https://www.plasticsurgery.org/for-medical-professionals/education-and-resources/publications/breast-reconstruction-magazine 2. Breast Reconstruction Awareness Campaign:  HotelLives.co.nz 3. Plastic surgery Implant information:  https://www.plasticsurgery.org/patient-safety/breast-implant-safety

## 2018-10-19 NOTE — Progress Notes (Signed)
1 Day Post-Op   Subjective/Chief Complaint: Complains of soreness but manageable   Objective: Vital signs in last 24 hours: Temp:  [96.9 F (36.1 C)-98.4 F (36.9 C)] 97.4 F (36.3 C) (04/02 0840) Pulse Rate:  [61-97] 68 (04/02 0840) Resp:  [11-19] 18 (04/02 0840) BP: (82-128)/(48-80) 110/73 (04/02 0840) SpO2:  [95 %-100 %] 99 % (04/02 0840)    Intake/Output from previous day: 04/01 0701 - 04/02 0700 In: 2360 [P.O.:600; I.V.:1700] Out: 2330 [Urine:2300; Drains:25; Blood:5] Intake/Output this shift: Total I/O In: 240 [P.O.:240] Out: 10 [Drains:10]  General appearance: alert and cooperative Resp: clear to auscultation bilaterally Chest wall: skin flaps look good Cardio: regular rate and rhythm GI: soft, non-tender; bowel sounds normal; no masses,  no organomegaly  Lab Results:  No results for input(s): WBC, HGB, HCT, PLT in the last 72 hours. BMET No results for input(s): NA, K, CL, CO2, GLUCOSE, BUN, CREATININE, CALCIUM in the last 72 hours. PT/INR No results for input(s): LABPROT, INR in the last 72 hours. ABG No results for input(s): PHART, HCO3 in the last 72 hours.  Invalid input(s): PCO2, PO2  Studies/Results: Nm Sentinel Node Inj-no Rpt (breast)  Result Date: 10/18/2018 Sulfur colloid was injected by the nuclear medicine technologist for melanoma sentinel node.    Anti-infectives: Anti-infectives (From admission, onward)   Start     Dose/Rate Route Frequency Ordered Stop   10/18/18 1400  ceFAZolin (ANCEF) IVPB 2g/100 mL premix     2 g 200 mL/hr over 30 Minutes Intravenous Every 8 hours 10/18/18 1047     10/18/18 1045  polymyxin B 500,000 Units, bacitracin 50,000 Units in sodium chloride 0.9 % 500 mL irrigation  Status:  Discontinued       As needed 10/18/18 1046 10/18/18 1058   10/18/18 0700  ceFAZolin (ANCEF) IVPB 2g/100 mL premix     2 g 200 mL/hr over 30 Minutes Intravenous On call to O.R. 10/18/18 0646 10/18/18 0821   10/18/18 0700  ceFAZolin  (ANCEF) IVPB 2g/100 mL premix     2 g 200 mL/hr over 30 Minutes Intravenous On call to O.R. 10/18/18 0649 10/19/18 0559      Assessment/Plan: s/p Procedure(s): LEFT MASTECTOMY WITH SENTINEL LYMPH NODE BIOPSY (Left) IMMEDIATEVBREAST RECONSTRUCTION WITH PLACEMENT OF TISSUE EXPANDER AND FLEX HD (ACELLULAR HYDRATED DERMIS) (Left) Advance diet Discharge  LOS: 0 days    [name] 10/19/2018

## 2018-10-19 NOTE — Discharge Summary (Signed)
Physician Discharge Summary  Patient ID: [name] MRN: [id number] DOB/AGE: 48-Mar-1972 48 y.o.  Admit date: 10/18/2018 Discharge date: 10/19/2018  Admission Diagnoses:  Discharge Diagnoses:  Active Problems:   Breast cancer [facility])   Discharged Condition: good  Hospital Course: the pt underwent left mastectomy with sentinel node biopsy and reconstruction. She tolerated surgery well. On pod 1 she was ready for d/c home  Consults: None  Significant Diagnostic Studies: none  Treatments: surgery: as above  Discharge Exam: Blood pressure 110/73, pulse 68, temperature (!) 97.4 F (36.3 C), resp. rate 18, height 5' 5.5" (1.664 m), weight 64.6 kg, SpO2 99 %. General appearance: alert and cooperative Resp: clear to auscultation bilaterally Chest wall: skin flaps look good Cardio: regular rate and rhythm GI: soft, non-tender; bowel sounds normal; no masses,  no organomegaly  Disposition: Discharge disposition: 01-Home or Self Care       Discharge Instructions    Call MD for:  difficulty breathing, headache or visual disturbances   Complete by:  As directed    Call MD for:  extreme fatigue   Complete by:  As directed    Call MD for:  hives   Complete by:  As directed    Call MD for:  persistant dizziness or light-headedness   Complete by:  As directed    Call MD for:  persistant nausea and vomiting   Complete by:  As directed    Call MD for:  redness, tenderness, or signs of infection (pain, swelling, redness, odor or green/yellow discharge around incision site)   Complete by:  As directed    Call MD for:  severe uncontrolled pain   Complete by:  As directed    Call MD for:  temperature >100.4   Complete by:  As directed    Diet - low sodium heart healthy   Complete by:  As directed    Discharge instructions   Complete by:  As directed    Sponge bathe while drains are in. No overhead activity with left arm. Empty drain, record output, recharge bulb twice a day.  Wear binder most of the time   Increase activity slowly   Complete by:  As directed    No wound care   Complete by:  As directed      Allergies as of 10/19/2018   No Known Allergies     Medication List    TAKE these medications   cephALEXin 500 MG capsule Commonly known as:  KEFLEX Take 1 capsule (500 mg total) by mouth 4 (four) times daily for 5 days.   diazepam 2 MG tablet Commonly known as:  Valium Take 1 tablet (2 mg total) by mouth every 6 (six) hours as needed for anxiety.   HYDROcodone-acetaminophen 5-325 MG tablet Commonly known as:  Norco Take 1 tablet by mouth every 6 (six) hours as needed for moderate pain.   levonorgestrel 20 MCG/24HR IUD Commonly known as:  MIRENA 1 each by Intrauterine route once.   ondansetron 4 MG tablet Commonly known as:  Zofran Take 1 tablet (4 mg total) by mouth every 8 (eight) hours as needed for up to 5 days.      Follow-up Information    Dillingham, Loel Lofty, DO In 1 week.   Specialty:  Plastic Surgery Contact information: [address] [place] [address] [phone]        Autumn Messing III, MD In 2 weeks.   Specialty:  General Surgery Contact information: [phone]  [address] [place] [address] [phone]           Signed: Autumn Messing III 10/19/2018, 8:50 AM

## 2018-10-23 ENCOUNTER — Other Ambulatory Visit: Payer: Self-pay | Admitting: Oncology

## 2018-10-23 ENCOUNTER — Encounter: Payer: Self-pay | Admitting: General Practice

## 2018-10-23 NOTE — Progress Notes (Signed)
[place] Psychosocial Distress Screening Spiritual Care  Left message by phone for [facility] to introduce [facility] team/resources, reviewing distress screen per protocol.  The patient scored a 5 on the Psychosocial Distress Thermometer which indicates moderate distress.   ONCBCN DISTRESS SCREENING 10/23/2018  Screening Type Initial Screening  Distress experienced in past week (1-10) 5  Emotional problem type Nervousness/Anxiety  Information Concerns Type Lack of info about diagnosis;Lack of info about treatment;Lack of info about complementary therapy choices  Referral to support programs Yes   Per pt, she felt much better after speaking with Dr Marlou Starks. Per husband, who answered phone, pt is keeping in close touch with surgeon due to high pain level. He requested return call later in week.  Follow up needed: Yes. Plan to f/u by phone in several days per family request.   Pauline Good, [facility] Pager [phone] Voicemail [phone]

## 2018-10-24 ENCOUNTER — Encounter: Payer: Self-pay | Admitting: *Deleted

## 2018-10-24 ENCOUNTER — Encounter: Payer: Self-pay | Admitting: Oncology

## 2018-10-24 ENCOUNTER — Ambulatory Visit (HOSPITAL_BASED_OUTPATIENT_CLINIC_OR_DEPARTMENT_OTHER): Payer: 59 | Admitting: Oncology

## 2018-10-24 DIAGNOSIS — Z170 Estrogen receptor positive status [ER+]: Secondary | ICD-10-CM | POA: Diagnosis not present

## 2018-10-24 DIAGNOSIS — C50412 Malignant neoplasm of upper-outer quadrant of left female breast: Secondary | ICD-10-CM

## 2018-10-24 MED ORDER — LORATADINE 10 MG PO TABS: 10.0000 mg | ORAL_TABLET | Freq: Every day | ORAL | 0 refills | Status: DC

## 2018-10-24 MED ORDER — PROCHLORPERAZINE MALEATE 10 MG PO TABS: 10.0000 mg | ORAL_TABLET | Freq: Four times a day (QID) | ORAL | 1 refills | Status: DC | PRN

## 2018-10-24 MED ORDER — DEXAMETHASONE 4 MG PO TABS: ORAL_TABLET | ORAL | 1 refills | Status: DC

## 2018-10-24 MED ORDER — LIDOCAINE-PRILOCAINE 2.5-2.5 % EX CREA: TOPICAL_CREAM | CUTANEOUS | 3 refills | Status: DC

## 2018-10-24 MED ORDER — LORAZEPAM 0.5 MG PO TABS: 0.5000 mg | ORAL_TABLET | Freq: Every evening | ORAL | 0 refills | Status: DC | PRN

## 2018-10-24 NOTE — Progress Notes (Signed)
[place]  [phone] [phone]     ID: [name] DOB: 1970-12-28  MR#: [id number]  CNO#:[id number]  Patient Care Team: Mauro Kaufmann, RN as Oncology Nurse Navigator Rockwell Germany, RN as Oncology Nurse Navigator Jovita Kussmaul, MD as Consulting Physician (General Surgery) Magrinat, Virgie Dad, MD as Consulting Physician (Oncology) Kyung Rudd, MD as Consulting Physician (Radiation Oncology) Aloha Gell, MD as Consulting Physician (Obstetrics and Gynecology) Chauncey Cruel, MD OTHER MD:  CHIEF COMPLAINT: Estrogen receptor positive breast cancer  CURRENT TREATMENT: Adjuvant chemotherapy  This is a telehealth visit taking place on 10/24/18 at 4:36 PM   with the patient [name]  located at her home and agreeing to the visit, and myself as the provider located at the clinic.    INTERVAL HISTORY: [name] was seen today for postoperative evaluation following left mastectomy on 10/18/2018.  This was a WebEx visit and her husband was able to participate.  Since her last visit, she underwent bilateral breast MRI on 10/12/2018, with results revealing:  1. Biopsy clip artifacts within the retroareolar left breast and within the upper-outer quadrant of the left breast, corresponding to the 2 sites of known biopsy-proven carcinoma.  2. Extensive highly suspicious non-mass enhancement throughout a significant portion of the left breast, most prominently seen within the retroareolar left breast extending into the lower-outer quadrant and slightly into the lower-inner quadrant, with highly suspicious enhancement kinetics, which almost certainly represents additional multicentric disease.  3. Additional suspicious non-mass enhancement within the upper left breast, involving the upper-outer quadrant and upper-inner quadrant but most prominent at the 12 o'clock axis with associated suspicious washout kinetics.  4. No evidence of malignancy  within the right breast.  5. No evidence of metastatic lymphadenopathy.  Pathology [id number]) from the left mastectomy with sentinel lymph node biopsies on 10/18/2018 revealed:  1. 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  2. Out of 9 biopsied lymph nodes, 5 were positive for macrometastases, no extranodal extension was identified.  She is here to discuss the ongoing plan, which will consist of chemotherapy, adjuvant radiation, and then antiestrogens, possibly including goserelin.   REVIEW OF SYSTEMS: [name] reports her surgery went well. She notes a lot of pain, shortness of breath, and tightness in her chest, which has become a constrictive feeling in the last few days. She denies any issues with the incision or the drain. She reports she has been constipated since surgery. She notes she is scheduled to follow up with Dr. [name] on Thursday, 10/26/2018.   The patient denies unusual headaches, visual changes, nausea, vomiting, stiff neck, dizziness, or gait imbalance. There has been no cough, phlegm production, or pleurisy, no chest pain or pressure, and no change in bowel or bladder habits. The patient denies fever, rash, bleeding, unexplained fatigue or unexplained weight loss. A detailed review of systems was otherwise entirely negative.   HISTORY OF CURRENT ILLNESS: From the original intake note:  "[name]" presented with left nipple retraction for 1 week with retroareolar mass in the upper outer quadrant. She underwent bilateral diagnostic mammography with CAD and left breast ultrasonography at [facility] on 09/25/2018 showing: 7 mm oval duct in the left breast; no significant abnormalities in the left axilla. She also underwent additional imaging with left breast digital diagnostic mammogram on 10/02/2018 showing: new 0.4 cm cluster of grouped heterogeneous calcifications in the left breast.  Accordingly on 10/02/2018 she proceeded to biopsy of the left breast area  in question. 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 Prognostic indicators significant for: estrogen receptor, 90% positive, with moderate staining intensity and progesterone receptor, 100% positive, with strong staining intensity. Proliferation marker Ki67 at <1%. HER2 negative (1+).   The patient's subsequent history is as detailed above.   PAST MEDICAL HISTORY: No past medical history on file.  PAST SURGICAL HISTORY: Past Surgical History:  Procedure Laterality Date  . BREAST RECONSTRUCTION WITH PLACEMENT OF TISSUE EXPANDER AND FLEX HD (ACELLULAR HYDRATED DERMIS) Left 10/18/2018   Procedure: IMMEDIATEVBREAST RECONSTRUCTION WITH PLACEMENT OF TISSUE EXPANDER AND FLEX HD (ACELLULAR HYDRATED DERMIS);  Surgeon: Wallace Going, DO;  Location: [place];  Service: Plastics;  Laterality: Left;  Marland Kitchen MASTECTOMY W/ SENTINEL NODE BIOPSY Left 10/18/2018   Procedure: LEFT MASTECTOMY WITH SENTINEL LYMPH NODE BIOPSY;  Surgeon: Jovita Kussmaul, MD;  Location: [place];  Service: General;  Laterality: Left;  . NO PAST SURGERIES      FAMILY HISTORY Family History  Problem Relation Age of Onset  . Breast cancer Neg Hx   . Ovarian cancer Neg Hx    As of March 2020, patient's father is living and healthy at age 48. Patient's mother is also living and healthy at age 4. The patient denies a family hx of breast or ovarian cancer. She has 1 sister.  GYNECOLOGIC HISTORY:  No LMP recorded. (Menstrual status: IUD). Menarche: 48 years old Age at first live birth: 48 years old [place] P 2 LMP in 2010 Contraceptive currently has Mirena IUD in place, reports she used birth control pills between ages 54 to 61 HRT n/a  Hysterectomy?  no BSO? 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  She has a PhD in Probation officer.  Husband [name] is an Art gallery manager. She lives at home with her husband and 3 children. She has two children, [name] age 30 and [name] age 4. Husband [name] has one daughter, Mikiala Fugett age 48, who lives with them.      ADVANCED DIRECTIVES: In the absence of any documentation to the contrary her husband [name] is her HCPOA.   HEALTH MAINTENANCE: Social History   Tobacco Use  . Smoking status: Never Smoker  . Smokeless tobacco: Never Used  Substance Use Topics  . Alcohol use: Yes    Alcohol/week: 3.0 standard drinks    Types: 3 Standard drinks or equivalent per week  . Drug use: Never     Colonoscopy: never done  PAP: 08/2017  Bone density: never done   No Known Allergies  Current Outpatient Medications  Medication Sig Dispense Refill  . diazepam (VALIUM) 2 MG tablet Take 1 tablet (2 mg total) by mouth every 6 (six) hours as needed for anxiety. 30 tablet 0  . HYDROcodone-acetaminophen (NORCO) 5-325 MG tablet Take 1 tablet by mouth every 6 (six) hours as needed for moderate pain. 30 tablet 0  . levonorgestrel (MIRENA) 20 MCG/24HR IUD 1 each by Intrauterine route once.     No current facility-administered medications for this visit.     OBJECTIVE: Young white woman who appears stated age  There were no vitals filed for this visit.   There is no height or weight on file to calculate  BMI.   Wt Readings from Last 3 Encounters:  10/18/18 142 lb 6.7 oz (64.6 kg)  10/11/18 140 lb (63.5 kg)  10/12/18 138 lb (62.6 kg)      ECOG FS:1 - Symptomatic but completely ambulatory  The patient is very pleased with her reconstruction so far, although she does have significant chest tightness and feels her arm is "frozen".  She states there is no redness, swelling, or dehiscence of the wound   LAB RESULTS:  CMP     Component Value  Date/Time   NA 141 10/11/2018 0846   K 4.4 10/11/2018 0846   CL 107 10/11/2018 0846   CO2 24 10/11/2018 0846   GLUCOSE 86 10/11/2018 0846   BUN 14 10/11/2018 0846   CREATININE 0.87 10/11/2018 0846   CALCIUM 9.1 10/11/2018 0846   PROT 7.1 10/11/2018 0846   ALBUMIN 4.1 10/11/2018 0846   AST 16 10/11/2018 0846   ALT 18 10/11/2018 0846   ALKPHOS 40 10/11/2018 0846   BILITOT 0.6 10/11/2018 0846   GFRNONAA >60 10/11/2018 0846   GFRAA >60 10/11/2018 0846    No results found for: TOTALPROTELP, ALBUMINELP, A1GS, A2GS, BETS, BETA2SER, GAMS, MSPIKE, SPEI  No results found for: KPAFRELGTCHN, LAMBDASER, [facility]  Lab Results  Component Value Date   WBC 5.9 10/11/2018   NEUTROABS 3.4 10/11/2018   HGB 14.4 10/11/2018   HCT 44.9 10/11/2018   MCV 95.9 10/11/2018   PLT 314 10/11/2018    '@LASTCHEMISTRY' @  No results found for: LABCA2  No components found for: [id number]  No results for input(s): INR in the last 168 hours.  No results found for: LABCA2  No results found for: LOV564  No results found for: PPI951  No results found for: OAC166  No results found for: CA2729  No components found for: HGQUANT  No results found for: CEA1 / No results found for: CEA1   No results found for: AFPTUMOR  No results found for: CHROMOGRNA  No results found for: PSA1  No visits with results within 3 Day(s) from this visit.  Latest known visit with results is:  Appointment on 10/11/2018  Component Date Value Ref Range Status  . WBC Count 10/11/2018 5.9  4.0 - 10.5 K/uL Final  . RBC 10/11/2018 4.68  3.87 - 5.11 MIL/uL Final  . Hemoglobin 10/11/2018 14.4  12.0 - 15.0 g/dL Final  . HCT 10/11/2018 44.9  36.0 - 46.0 % Final  . MCV 10/11/2018 95.9  80.0 - 100.0 fL Final  . MCH 10/11/2018 30.8  26.0 - 34.0 pg Final  . MCHC 10/11/2018 32.1  30.0 - 36.0 g/dL Final  . RDW 10/11/2018 12.6  11.5 - 15.5 % Final  . Platelet Count 10/11/2018 314  150 - 400 K/uL Final  . nRBC 10/11/2018 0.0   0.0 - 0.2 % Final  . Neutrophils Relative % 10/11/2018 57  % Final  . Neutro Abs 10/11/2018 3.4  1.7 - 7.7 K/uL Final  . Lymphocytes Relative 10/11/2018 30  % Final  . Lymphs Abs 10/11/2018 1.8  0.7 - 4.0 K/uL Final  . Monocytes Relative 10/11/2018 8  % Final  . Monocytes Absolute 10/11/2018 0.5  0.1 - 1.0 K/uL Final  . Eosinophils Relative 10/11/2018 4  % Final  . Eosinophils Absolute 10/11/2018 0.3  0.0 - 0.5 K/uL Final  . Basophils Relative 10/11/2018 1  % Final  . Basophils Absolute 10/11/2018 0.0  0.0 - 0.1 K/uL Final  . Immature Granulocytes 10/11/2018 0  % Final  .  Abs Immature Granulocytes 10/11/2018 0.01  0.00 - 0.07 K/uL Final   Performed at [facility], [place] [address], [place], [place] [address]  . Sodium 10/11/2018 141  135 - 145 mmol/L Final  . Potassium 10/11/2018 4.4  3.5 - 5.1 mmol/L Final  . Chloride 10/11/2018 107  98 - 111 mmol/L Final  . CO2 10/11/2018 24  22 - 32 mmol/L Final  . Glucose, Bld 10/11/2018 86  70 - 99 mg/dL Final  . BUN 10/11/2018 14  6 - 20 mg/dL Final  . Creatinine 10/11/2018 0.87  0.44 - 1.00 mg/dL Final  . Calcium 10/11/2018 9.1  8.9 - 10.3 mg/dL Final  . Total Protein 10/11/2018 7.1  6.5 - 8.1 g/dL Final  . Albumin 10/11/2018 4.1  3.5 - 5.0 g/dL Final  . AST 10/11/2018 16  15 - 41 U/L Final  . ALT 10/11/2018 18  0 - 44 U/L Final  . Alkaline Phosphatase 10/11/2018 40  38 - 126 U/L Final  . Total Bilirubin 10/11/2018 0.6  0.3 - 1.2 mg/dL Final  . GFR, Est Non Af Am 10/11/2018 >60  >60 mL/min Final  . GFR, Est AFR Am 10/11/2018 >60  >60 mL/min Final  . Anion gap 10/11/2018 10  5 - 15 Final   Performed at [facility], [address] [place], [place] [address]    (this displays the last labs from the last 3 days)  No results found for: TOTALPROTELP, ALBUMINELP, A1GS, A2GS, BETS, BETA2SER, GAMS, MSPIKE, SPEI (this displays SPEP labs)  No results found for: KPAFRELGTCHN, LAMBDASER, KAPLAMBRATIO  (kappa/lambda light chains)  No results found for: HGBA, HGBA2QUANT, HGBFQUANT, HGBSQUAN (Hemoglobinopathy evaluation)   No results found for: LDH  No results found for: IRON, TIBC, IRONPCTSAT (Iron and TIBC)  No results found for: FERRITIN  Urinalysis    Component Value Date/Time   COLORURINE YELLOW 01/27/2008 0235   APPEARANCEUR CLOUDY (A) 01/27/2008 0235   LABSPEC 1.027 01/27/2008 0235   PHURINE 7.0 01/27/2008 0235   GLUCOSEU NEGATIVE 01/27/2008 0235   HGBUR NEGATIVE 01/27/2008 0235   BILIRUBINUR NEGATIVE 01/27/2008 0235   KETONESUR NEGATIVE 01/27/2008 0235   PROTEINUR NEGATIVE 01/27/2008 0235   UROBILINOGEN 0.2 01/27/2008 0235   NITRITE NEGATIVE 01/27/2008 0235   LEUKOCYTESUR SMALL (A) 01/27/2008 0235     STUDIES: Pathology results extensively reviewed with the patient  ELIGIBLE FOR AVAILABLE RESEARCH PROTOCOL: no  ASSESSMENT: 48 y.o. [facility] woman status post left breast upper outer quadrant biopsy 10/02/2018 for a clinical T2N0, stage IB invasive lobular breast cancer, grade not stated, estrogen and progesterone receptor positive, HER-2 not amplified, with an MIB-1 of less than 1%  (1) status post left mastectomy and sentinel lymph node sampling 10/18/2018 for a pT3 pN2, stage IIA invasive lobular breast cancer, grade 2, with close but negative margins  (a) 5 of 9 lymph nodes removed had micrometastatic deposits of tumor  (b) immediate expander placement  (2) chemotherapy will consist of doxorubicin and cyclophosphamide in dose dense fashion x4, starting 11/21/2018, to be followed by weekly paclitaxel x12  (3) adjuvant radiation to follow  (4) antiestrogens to start at the completion of local treatment  (a) consider goserelin/anastrozole  PLAN: I spent approximately 50 minutes face to face with [name] with more than 50% of that time spent in counseling and coordination of care.  We reviewed extensively reviewed the results of her pathology report and she  understands she had 9 lymph nodes removed of which  5 had micrometastatic deposits of cancer.  She also understands the actual tumor mass was in the range of 3 inches.  This would have been classified as stage III 2 years ago but now it is classified as stage IIa  We discussed staging studies and she understands that there is a significant risk of false positives and that even if all the studies are completely clear it does not mean she does not have occult cancer elsewhere.  Accordingly after much discussion we decided we would not go for staging studies in the absence of specific symptoms to evaluate.  This of course is also the current standard of care  She understands she very likely does have microscopic spread of this tumor elsewhere in the body and therefore systemic therapy needs to be optimized.  The plan is for cyclophosphamide and doxorubicin dose dense fashion x4 with paclitaxel weekly x12.  It is true that lobular breast cancer does not respond as well as ductal to chemotherapy but it does still respond and we have no better treatment than the one just quoted.  We discussed the possible toxicities side effects and complications of these agents and she will also meet with our chemotherapy teaching nurse hopefully on the same day as she has her echo so she does not have to go out of the house more than once that day.  They are also interested in meeting with a nutritionist at that we will also try to arrange.  She will need a port.  I have alerted her surgeon regarding  We are going to start 11/21/2023 which time she should be sufficiently recovered from her surgery and also I am hoping by then much of the current pandemic should have blown over  Once she is done with chemo of course she will proceed to adjuvant radiation.  She will then be on antiestrogens and my recommendation will be to do that for total of 10 years.  Whether or not she may benefit from goserelin will depend on whether she  becomes postmenopausal with chemotherapy.  Fertility is not an issue for her  They know to call for any other issue that may develop before the next visit.    Chauncey Cruel, MD   10/24/2018 4:36 PM Medical Oncology and Hematology [facility] [address] [place], [place] [address] Tel. [phone]    Fax. [phone]   This document serves as a record of services personally performed by Lurline Del, MD. It was created on his behalf by Wilburn Mylar, a trained medical scribe. The creation of this record is based on the scribe's personal observations and the provider's statements to them.   I, Lurline Del MD, have reviewed the above documentation for accuracy and completeness, and I agree with the above.

## 2018-10-24 NOTE — Progress Notes (Signed)
START ON PATHWAY REGIMEN - Breast   Dose-Dense AC q14 days:   A cycle is every 14 days:     Doxorubicin      Cyclophosphamide      Pegfilgrastim-xxxx   **Always confirm dose/schedule in your pharmacy ordering system**    Paclitaxel 80 mg/m2 Weekly:   Administer weekly:     Paclitaxel   **Always confirm dose/schedule in your pharmacy ordering system**    Patient Characteristics: Postoperative without Neoadjuvant Therapy (Pathologic Staging), Invasive Disease, Adjuvant Therapy, HER2 Negative/Unknown/Equivocal, ER Positive, Node Positive, Node Positive (4+) Therapeutic Status: Postoperative without Neoadjuvant Therapy (Pathologic Staging) AJCC Grade: GX AJCC N Category: pNX AJCC M Category: cM0 ER Status: Positive (+) AJCC 8 Stage Grouping: IIA HER2 Status: Negative (-) Oncotype Dx Recurrence Score: Not Appropriate AJCC T Category: pTX PR Status: Positive (+) Intent of Therapy: Curative Intent, Discussed with Patient 

## 2018-10-25 ENCOUNTER — Telehealth: Payer: Self-pay | Admitting: Plastic Surgery

## 2018-10-25 ENCOUNTER — Telehealth: Payer: Self-pay

## 2018-10-25 ENCOUNTER — Ambulatory Visit: Payer: Self-pay | Admitting: General Surgery

## 2018-10-25 ENCOUNTER — Encounter: Payer: Self-pay | Admitting: *Deleted

## 2018-10-25 NOTE — Telephone Encounter (Signed)
Called patient to confirm appointment scheduled for tomorrow. Patient answered the following questions: °1.Has the patient traveled outside of the state of [place] at all within the past 6 weeks? No °2.Does the patient have a fever or cough at all? No °3.Has the patient been tested for COVID? Had a positive COVID test? No °4. Has the patient been in contact with anyone who has tested positive? No ° °

## 2018-10-25 NOTE — Telephone Encounter (Signed)
Nurse spoke with patient regarding questions with medications that were sent to pharmacy yesterday.   Nurse explained medications and usage. Informed patient these will be educated on during chemo education as well.  Pt voiced understanding no further needs at this time.

## 2018-10-26 ENCOUNTER — Encounter: Payer: Self-pay | Admitting: Plastic Surgery

## 2018-10-26 ENCOUNTER — Encounter: Payer: Self-pay | Admitting: General Practice

## 2018-10-26 ENCOUNTER — Ambulatory Visit (INDEPENDENT_AMBULATORY_CARE_PROVIDER_SITE_OTHER): Payer: 59 | Admitting: Plastic Surgery

## 2018-10-26 ENCOUNTER — Encounter (HOSPITAL_BASED_OUTPATIENT_CLINIC_OR_DEPARTMENT_OTHER): Payer: Self-pay | Admitting: *Deleted

## 2018-10-26 ENCOUNTER — Other Ambulatory Visit: Payer: Self-pay | Admitting: *Deleted

## 2018-10-26 ENCOUNTER — Other Ambulatory Visit: Payer: Self-pay

## 2018-10-26 VITALS — BP 116/78 | HR 86 | Temp 97.9°F | Ht 66.0 in | Wt 140.0 lb

## 2018-10-26 DIAGNOSIS — Z170 Estrogen receptor positive status [ER+]: Principal | ICD-10-CM

## 2018-10-26 DIAGNOSIS — C50412 Malignant neoplasm of upper-outer quadrant of left female breast: Secondary | ICD-10-CM

## 2018-10-26 MED ORDER — HYDROCODONE-ACETAMINOPHEN 5-325 MG PO TABS: 1.0000 | ORAL_TABLET | Freq: Four times a day (QID) | ORAL | 0 refills | Status: AC | PRN

## 2018-10-26 NOTE — Progress Notes (Signed)
   Subjective:    Patient ID: [name], female    DOB: 22-Oct-1970, 48 y.o.   MRN: [id number]  The patient is a 48 yrs old female here with her husband for follow-up after her surgery.  She underwent a left mastectomy with immediate reconstruction.  An expander and Flex HD was placed.  The drain is in and working well.  Drain output is minimal.  The incision is intact and healing nicely.  There is no sign of seroma or hematoma.  She is getting a port-a-cath next week and chemotherapy likely to start the week after.    Review of Systems  Constitutional: Positive for activity change. Negative for appetite change.  HENT: Negative.   Eyes: Negative.   Respiratory: Negative.   Cardiovascular: Negative.   Gastrointestinal: Negative.  Negative for abdominal distention and abdominal pain.  Endocrine: Negative.   Genitourinary: Negative.   Musculoskeletal: Negative.   Psychiatric/Behavioral: Negative.        Objective:   Physical Exam Vitals signs and nursing note reviewed.  Constitutional:      Appearance: Normal appearance.  HENT:     Head: Normocephalic and atraumatic.     Nose: Nose normal.     Mouth/Throat:     Mouth: Mucous membranes are moist.  Pulmonary:     Effort: Pulmonary effort is normal.  Neurological:     General: No focal deficit present.     Mental Status: She is alert.  Psychiatric:        Mood and Affect: Mood normal.        Behavior: Behavior normal.        Assessment & Plan:  Malignant neoplasm of upper-outer quadrant of left breast in female, estrogen receptor positive (HCC)  No fluid put in the expander.  Plan to remove the drain in 1 week. Follow-up in 1 week.

## 2018-10-26 NOTE — Progress Notes (Signed)
[facility] Spiritual Care Note  Had very productive phone conversation with [name], who is originally from [place] and far from her "nearest and dearest." She utilized Spiritual Care well to process and reflect on her feelings up to this point, especially with learning that she will need chemo and facing the disappointment of keeping the drain a week longer than expected. [name] is part of a blended family that is in the early stage of blending and includes a total of three teenagers (two biological), which adds social complexity. Her faith is important to her--and also hard to come by right now.   Provided empathic listening, normalization of feelings, help brainstorming language for responding to friends (setting boundaries/expectations, accepting help, etc), and emotional support. [name] knows to reach out by phone or email as desired, and we plan to f/u by phone next week as well.   [place], [place], [facility] Pager [phone] Voicemail [phone]

## 2018-10-27 ENCOUNTER — Telehealth: Payer: Self-pay | Admitting: Oncology

## 2018-10-27 ENCOUNTER — Other Ambulatory Visit: Payer: Self-pay | Admitting: Oncology

## 2018-10-27 NOTE — Progress Notes (Signed)
I called [name] regarding her questions or removing her Mirena IUD.  We discussed the fact that while on tamoxifen it is fine to be on a Mirena but she is comfortable off tamoxifen (she is not on tamoxifen now and is going to be starting chemo in about a month so it is best at this point to remove the Mirena.  She can have a nonhormonal IUD placed if she wishes for contraception  She then had questions regarding chemotherapy and I did mention CMF.  She understands that point C and CMF are from.,  Without the Taxol, the results tend to be equivalent except in younger patients who do seem to get more benefit from anthracyclines.  Of course CA plasty is yet more effective.  The problem with all those comparisons is that the few lobular cases included are swamped by the ductal cases.  That data is very firm I think for doubtful but not at all 4 lobular cases  Accordingly I told her if she wished to go with CMF, which is not cardiotoxic and which does not affect the immune system as much in this pandemic time I would be willing to do that.  On the other hand she understands she may be missing a few percentage points of long-term survival.  Luckily we do not have to make that decision until she returns to see me after her definitive surgery 11/17/2018, with chemo to start the following week

## 2018-10-27 NOTE — Telephone Encounter (Signed)
Added f/u with GM for 5/1 @ 8:30 am per 4/10 schedule message. Patient will have echo at 10 am and ched at 12 pm. Confirmed with patient.

## 2018-10-27 NOTE — Telephone Encounter (Signed)
Added appointment for May thru June beginning 5/6. Other appointments remain the same. Confirmed with patient.  Per patient she left a previous message regarding GM reaching out to Dr. Valentino Saxon at [facility] re whether or not IUD should be removed. Per patient previously there were differing opinions, however things have change since then and she would like to get this sorted out. Patient aware message would be routed to provider.

## 2018-10-30 ENCOUNTER — Encounter (HOSPITAL_BASED_OUTPATIENT_CLINIC_OR_DEPARTMENT_OTHER): Payer: Self-pay | Admitting: *Deleted

## 2018-10-30 ENCOUNTER — Other Ambulatory Visit: Payer: Self-pay

## 2018-10-31 DIAGNOSIS — Z30433 Encounter for removal and reinsertion of intrauterine contraceptive device: Secondary | ICD-10-CM | POA: Diagnosis not present

## 2018-10-31 NOTE — Progress Notes (Signed)
Pt husband came by to pick up his wife's CHG soap and pre-surgical Ensure. Both given along with instructions for each.

## 2018-11-01 ENCOUNTER — Inpatient Hospital Stay: Payer: 59 | Attending: Oncology | Admitting: Nutrition

## 2018-11-01 NOTE — Progress Notes (Signed)
Nutrition Assessment   Reason for Assessment:   Patient with questions regarding nutrition during treatment   ASSESSMENT:   48 year old female with new diagnosis of breast cancer.  S/p left mastectomy on 4/1.  Planning chemotherapy.    RD working remotely.  Called and spoke with patient via phone.  Patient with questions regarding how to manage nausea and vomiting.   Has not started chemotherapy yet.      Nutrition Focused Physical Exam: deferred   Medications: decadron, ativan, compazine   Labs: reviewed from 3/25   Anthropometrics:   Height: 65.5 inches Weight: 140 lb BMI: 22   Estimated Energy Needs  Kcals: 1600-1900 Protein: 80-95 g Fluid: 1.9 L/d   NUTRITION DIAGNOSIS: Food and nutrition related knowledge deficit related to cancer and cancer related treatment side effects as evidenced by questions related to how to handle nausea and vomiting with treatment   MALNUTRITION DIAGNOSIS: none at this time   INTERVENTION:  Discussed strategies to help with nausea and vomiting.  Will email handout to patient.   Encouraged taking antiemetics to help control symptoms. Encouraged good sources of protein and well balanced diet during treatment.   Contact information provided to patient.  Offered follow-up call but patient reports she will reach out if needed   MONITORING, EVALUATION, GOAL: Patient will consume adequate calories and protein to maintain weight during treatment   Next Visit: patient to contact as needed  Onetha Gaffey B. Zenia Resides, [place], [place] Registered Dietitian [phone] (pager)

## 2018-11-02 ENCOUNTER — Encounter (HOSPITAL_BASED_OUTPATIENT_CLINIC_OR_DEPARTMENT_OTHER)
Admission: RE | Disposition: A | Discharge status: Home / Self Care | Payer: Self-pay | Source: Home / Self Care | Attending: General Surgery

## 2018-11-02 ENCOUNTER — Telehealth: Payer: Self-pay | Admitting: Plastic Surgery

## 2018-11-02 ENCOUNTER — Other Ambulatory Visit: Payer: Self-pay

## 2018-11-02 ENCOUNTER — Encounter (HOSPITAL_BASED_OUTPATIENT_CLINIC_OR_DEPARTMENT_OTHER): Payer: Self-pay | Admitting: Certified Registered"

## 2018-11-02 ENCOUNTER — Ambulatory Visit (HOSPITAL_BASED_OUTPATIENT_CLINIC_OR_DEPARTMENT_OTHER): Payer: 59 | Admitting: Certified Registered"

## 2018-11-02 ENCOUNTER — Ambulatory Visit (HOSPITAL_COMMUNITY): Payer: 59

## 2018-11-02 ENCOUNTER — Ambulatory Visit (HOSPITAL_BASED_OUTPATIENT_CLINIC_OR_DEPARTMENT_OTHER)
Admission: RE | Admit: 2018-11-02 | Discharge: 2018-11-02 | Disposition: A | Discharge status: Home / Self Care | Payer: 59 | Attending: General Surgery | Admitting: General Surgery

## 2018-11-02 DIAGNOSIS — Z975 Presence of (intrauterine) contraceptive device: Secondary | ICD-10-CM | POA: Insufficient documentation

## 2018-11-02 DIAGNOSIS — Z9012 Acquired absence of left breast and nipple: Secondary | ICD-10-CM | POA: Diagnosis not present

## 2018-11-02 DIAGNOSIS — C50112 Malignant neoplasm of central portion of left female breast: Secondary | ICD-10-CM | POA: Insufficient documentation

## 2018-11-02 DIAGNOSIS — Z170 Estrogen receptor positive status [ER+]: Secondary | ICD-10-CM | POA: Insufficient documentation

## 2018-11-02 DIAGNOSIS — Z452 Encounter for adjustment and management of vascular access device: Secondary | ICD-10-CM | POA: Diagnosis not present

## 2018-11-02 DIAGNOSIS — J9811 Atelectasis: Secondary | ICD-10-CM | POA: Diagnosis not present

## 2018-11-02 DIAGNOSIS — C50912 Malignant neoplasm of unspecified site of left female breast: Secondary | ICD-10-CM | POA: Diagnosis present

## 2018-11-02 DIAGNOSIS — Z7289 Other problems related to lifestyle: Secondary | ICD-10-CM | POA: Diagnosis not present

## 2018-11-02 DIAGNOSIS — Z419 Encounter for procedure for purposes other than remedying health state, unspecified: Secondary | ICD-10-CM

## 2018-11-02 DIAGNOSIS — Z95828 Presence of other vascular implants and grafts: Secondary | ICD-10-CM

## 2018-11-02 DIAGNOSIS — C50512 Malignant neoplasm of lower-outer quadrant of left female breast: Secondary | ICD-10-CM | POA: Diagnosis not present

## 2018-11-02 HISTORY — DX: Malignant (primary) neoplasm, unspecified: C80.1

## 2018-11-02 HISTORY — PX: PORTACATH PLACEMENT: SHX2246

## 2018-11-02 SURGERY — INSERTION, TUNNELED CENTRAL VENOUS DEVICE, WITH PORT
Anesthesia: General | Site: Chest | Laterality: Right

## 2018-11-02 MED ORDER — PROPOFOL 10 MG/ML IV BOLUS
INTRAVENOUS | Status: DC | PRN
  Administered 2018-11-02: 180 mg via INTRAVENOUS

## 2018-11-02 MED ORDER — CELECOXIB 200 MG PO CAPS
200.0000 mg | ORAL_CAPSULE | ORAL | Status: AC
  Administered 2018-11-02: 200 mg via ORAL

## 2018-11-02 MED ORDER — PROMETHAZINE HCL 25 MG/ML IJ SOLN: 6.2500 mg | INTRAMUSCULAR | Status: DC | PRN

## 2018-11-02 MED ORDER — MIDAZOLAM HCL 2 MG/2ML IJ SOLN
INTRAMUSCULAR | Status: DC | PRN
  Administered 2018-11-02: 2 mg via INTRAVENOUS

## 2018-11-02 MED ORDER — CEFAZOLIN SODIUM-DEXTROSE 2-4 GM/100ML-% IV SOLN
2.0000 g | INTRAVENOUS | Status: AC
  Administered 2018-11-02: 2 g via INTRAVENOUS

## 2018-11-02 MED ORDER — MIDAZOLAM HCL 2 MG/2ML IJ SOLN: 1.0000 mg | INTRAMUSCULAR | Status: DC | PRN

## 2018-11-02 MED ORDER — CELECOXIB 200 MG PO CAPS
ORAL_CAPSULE | ORAL | Status: AC
  Filled 2018-11-02: qty 1

## 2018-11-02 MED ORDER — HEPARIN (PORCINE) IN NACL 1000-0.9 UT/500ML-% IV SOLN
INTRAVENOUS | Status: AC
  Filled 2018-11-02: qty 500

## 2018-11-02 MED ORDER — HEPARIN SOD (PORK) LOCK FLUSH 100 UNIT/ML IV SOLN
INTRAVENOUS | Status: AC
  Filled 2018-11-02: qty 5

## 2018-11-02 MED ORDER — SCOPOLAMINE 1 MG/3DAYS TD PT72: 1.0000 | MEDICATED_PATCH | Freq: Once | TRANSDERMAL | Status: DC | PRN

## 2018-11-02 MED ORDER — FENTANYL CITRATE (PF) 100 MCG/2ML IJ SOLN: 50.0000 ug | INTRAMUSCULAR | Status: DC | PRN

## 2018-11-02 MED ORDER — CEFAZOLIN SODIUM-DEXTROSE 2-4 GM/100ML-% IV SOLN
INTRAVENOUS | Status: AC
  Filled 2018-11-02: qty 100

## 2018-11-02 MED ORDER — OXYCODONE HCL 5 MG PO TABS
ORAL_TABLET | ORAL | Status: AC
  Filled 2018-11-02: qty 1

## 2018-11-02 MED ORDER — HEPARIN (PORCINE) IN NACL 2-0.9 UNITS/ML
INTRAMUSCULAR | Status: AC | PRN
  Administered 2018-11-02: 500 mL via INTRAVENOUS

## 2018-11-02 MED ORDER — SUCCINYLCHOLINE CHLORIDE 200 MG/10ML IV SOSY
PREFILLED_SYRINGE | INTRAVENOUS | Status: AC
  Filled 2018-11-02: qty 10

## 2018-11-02 MED ORDER — GABAPENTIN 300 MG PO CAPS
ORAL_CAPSULE | ORAL | Status: AC
  Filled 2018-11-02: qty 1

## 2018-11-02 MED ORDER — LACTATED RINGERS IV SOLN
INTRAVENOUS | Status: DC
  Administered 2018-11-02: 07:00:00 via INTRAVENOUS

## 2018-11-02 MED ORDER — LIDOCAINE 2% (20 MG/ML) 5 ML SYRINGE
INTRAMUSCULAR | Status: DC | PRN
  Administered 2018-11-02: 60 mg via INTRAVENOUS

## 2018-11-02 MED ORDER — FENTANYL CITRATE (PF) 100 MCG/2ML IJ SOLN
INTRAMUSCULAR | Status: AC
  Filled 2018-11-02: qty 2

## 2018-11-02 MED ORDER — BUPIVACAINE HCL (PF) 0.25 % IJ SOLN
INTRAMUSCULAR | Status: AC
  Filled 2018-11-02: qty 30

## 2018-11-02 MED ORDER — ACETAMINOPHEN 500 MG PO TABS
1000.0000 mg | ORAL_TABLET | ORAL | Status: AC
  Administered 2018-11-02: 1000 mg via ORAL

## 2018-11-02 MED ORDER — ACETAMINOPHEN 500 MG PO TABS
ORAL_TABLET | ORAL | Status: AC
  Filled 2018-11-02: qty 2

## 2018-11-02 MED ORDER — PROPOFOL 10 MG/ML IV BOLUS
INTRAVENOUS | Status: AC
  Filled 2018-11-02: qty 40

## 2018-11-02 MED ORDER — CHLORHEXIDINE GLUCONATE CLOTH 2 % EX PADS: 6.0000 | MEDICATED_PAD | Freq: Once | CUTANEOUS | Status: DC

## 2018-11-02 MED ORDER — HEPARIN SOD (PORK) LOCK FLUSH 100 UNIT/ML IV SOLN
INTRAVENOUS | Status: DC | PRN
  Administered 2018-11-02: 500 [IU] via INTRAVENOUS

## 2018-11-02 MED ORDER — BUPIVACAINE HCL (PF) 0.25 % IJ SOLN
INTRAMUSCULAR | Status: DC | PRN
  Administered 2018-11-02: 5 mL

## 2018-11-02 MED ORDER — OXYCODONE HCL 5 MG PO TABS
5.0000 mg | ORAL_TABLET | ORAL | Status: DC | PRN
  Administered 2018-11-02: 5 mg via ORAL

## 2018-11-02 MED ORDER — LIDOCAINE 2% (20 MG/ML) 5 ML SYRINGE
INTRAMUSCULAR | Status: AC
  Filled 2018-11-02: qty 5

## 2018-11-02 MED ORDER — HYDROCODONE-ACETAMINOPHEN 5-325 MG PO TABS: 1.0000 | ORAL_TABLET | Freq: Four times a day (QID) | ORAL | 0 refills | Status: DC | PRN

## 2018-11-02 MED ORDER — ONDANSETRON HCL 4 MG/2ML IJ SOLN
INTRAMUSCULAR | Status: DC | PRN
  Administered 2018-11-02: 4 mg via INTRAVENOUS

## 2018-11-02 MED ORDER — FENTANYL CITRATE (PF) 100 MCG/2ML IJ SOLN
INTRAMUSCULAR | Status: DC | PRN
  Administered 2018-11-02: 50 ug via INTRAVENOUS
  Administered 2018-11-02 (×2): 25 ug via INTRAVENOUS
  Administered 2018-11-02: 50 ug via INTRAVENOUS

## 2018-11-02 MED ORDER — GABAPENTIN 300 MG PO CAPS
300.0000 mg | ORAL_CAPSULE | ORAL | Status: AC
  Administered 2018-11-02: 300 mg via ORAL

## 2018-11-02 MED ORDER — MIDAZOLAM HCL 2 MG/2ML IJ SOLN
INTRAMUSCULAR | Status: AC
  Filled 2018-11-02: qty 2

## 2018-11-02 MED ORDER — DEXAMETHASONE SODIUM PHOSPHATE 10 MG/ML IJ SOLN
INTRAMUSCULAR | Status: DC | PRN
  Administered 2018-11-02: 10 mg via INTRAVENOUS

## 2018-11-02 MED ORDER — FENTANYL CITRATE (PF) 100 MCG/2ML IJ SOLN
25.0000 ug | INTRAMUSCULAR | Status: DC | PRN
  Administered 2018-11-02: 25 ug via INTRAVENOUS

## 2018-11-02 SURGICAL SUPPLY — 56 items
ADH SKN CLS APL DERMABOND .7 (GAUZE/BANDAGES/DRESSINGS) ×2
APL PRP STRL LF DISP 70% ISPRP (MISCELLANEOUS) ×2
BAG DECANTER FOR FLEXI CONT (MISCELLANEOUS) ×3 IMPLANT
BLADE SURG 15 STRL LF DISP TIS (BLADE) ×1 IMPLANT
BLADE SURG 15 STRL SS (BLADE) ×3
CANISTER SUCT 1200ML W/VALVE (MISCELLANEOUS) IMPLANT
CHLORAPREP W/TINT 26 (MISCELLANEOUS) ×5 IMPLANT
CLEANER CAUTERY TIP 5X5 PAD (MISCELLANEOUS) ×1 IMPLANT
COVER BACK TABLE REUSABLE LG (DRAPES) ×3 IMPLANT
COVER MAYO STAND REUSABLE (DRAPES) ×3 IMPLANT
COVER PROBE W GEL 5X96 (DRAPES) ×2 IMPLANT
COVER WAND RF STERILE (DRAPES) IMPLANT
DECANTER SPIKE VIAL GLASS SM (MISCELLANEOUS) IMPLANT
DERMABOND ADVANCED (GAUZE/BANDAGES/DRESSINGS) ×4
DERMABOND ADVANCED .7 DNX12 (GAUZE/BANDAGES/DRESSINGS) ×1 IMPLANT
DRAPE C-ARM 42X72 X-RAY (DRAPES) ×2 IMPLANT
DRAPE LAPAROSCOPIC ABDOMINAL (DRAPES) ×3 IMPLANT
DRAPE UTILITY XL STRL (DRAPES) ×3 IMPLANT
ELECT REM PT RETURN 9FT ADLT (ELECTROSURGICAL) ×3
ELECTRODE REM PT RTRN 9FT ADLT (ELECTROSURGICAL) ×1 IMPLANT
GLOVE BIO SURGEON STRL SZ7.5 (GLOVE) ×3 IMPLANT
GLOVE BIOGEL PI IND STRL 7.0 (GLOVE) IMPLANT
GLOVE BIOGEL PI IND STRL 7.5 (GLOVE) IMPLANT
GLOVE BIOGEL PI INDICATOR 7.0 (GLOVE) ×2
GLOVE BIOGEL PI INDICATOR 7.5 (GLOVE) ×2
GLOVE SURG SYN 7.5  E (GLOVE) ×2
GLOVE SURG SYN 7.5 E (GLOVE) ×1 IMPLANT
GLOVE SURG SYN 7.5 PF PI (GLOVE) IMPLANT
GOWN STRL REUS W/ TWL LRG LVL3 (GOWN DISPOSABLE) ×2 IMPLANT
GOWN STRL REUS W/TWL LRG LVL3 (GOWN DISPOSABLE) ×6
IV KIT MINILOC 20X1 SAFETY (NEEDLE) IMPLANT
KIT PORT POWER 8FR ISP CVUE (Port) ×2 IMPLANT
NDL HYPO 25X1 1.5 SAFETY (NEEDLE) ×1 IMPLANT
NDL SAFETY ECLIPSE 18X1.5 (NEEDLE) IMPLANT
NDL SPNL 22GX3.5 QUINCKE BK (NEEDLE) IMPLANT
NEEDLE HYPO 18GX1.5 SHARP (NEEDLE)
NEEDLE HYPO 22GX1.5 SAFETY (NEEDLE) IMPLANT
NEEDLE HYPO 25X1 1.5 SAFETY (NEEDLE) ×3 IMPLANT
NEEDLE SPNL 22GX3.5 QUINCKE BK (NEEDLE) IMPLANT
PACK BASIN DAY SURGERY FS (CUSTOM PROCEDURE TRAY) ×3 IMPLANT
PAD CLEANER CAUTERY TIP 5X5 (MISCELLANEOUS) ×2
PENCIL BUTTON HOLSTER BLD 10FT (ELECTRODE) ×3 IMPLANT
SLEEVE SCD COMPRESS KNEE MED (MISCELLANEOUS) ×2 IMPLANT
SUT MON AB 4-0 PC3 18 (SUTURE) ×3 IMPLANT
SUT PROLENE 2 0 SH DA (SUTURE) ×3 IMPLANT
SUT SILK 2 0 TIES 17X18 (SUTURE)
SUT SILK 2-0 18XBRD TIE BLK (SUTURE) IMPLANT
SUT VIC AB 3-0 SH 27 (SUTURE) ×3
SUT VIC AB 3-0 SH 27X BRD (SUTURE) ×1 IMPLANT
SYR 10ML LL (SYRINGE) ×2 IMPLANT
SYR 5ML LL (SYRINGE) ×3 IMPLANT
SYR CONTROL 10ML LL (SYRINGE) ×6 IMPLANT
TOWEL GREEN STERILE FF (TOWEL DISPOSABLE) ×6 IMPLANT
TUBE CONNECTING 20'X1/4 (TUBING)
TUBE CONNECTING 20X1/4 (TUBING) IMPLANT
YANKAUER SUCT BULB TIP NO VENT (SUCTIONS) IMPLANT

## 2018-11-02 NOTE — Interval H&P Note (Signed)
History and Physical Interval Note:  11/02/2018 7:36 AM  [name]  has presented today for surgery, with the diagnosis of Left BREAST CANCER.  The various methods of treatment have been discussed with the patient and family. After consideration of risks, benefits and other options for treatment, the patient has consented to  Procedure(s): INSERTION PORT-A-CATH POSSIBLE ULTRSOUND (N/A) as a surgical intervention.  The patient's history has been reviewed, patient examined, no change in status, stable for surgery.  I have reviewed the patient's chart and labs.  Questions were answered to the patient's satisfaction.     Autumn Messing III

## 2018-11-02 NOTE — Discharge Instructions (Signed)

## 2018-11-02 NOTE — H&P (Signed)
[place]  Location: [facility] Patient #: [id number] DOB: [birth year] Undefined / Language: Cleophus Molt / Race: White Female   History of Present Illness The patient is a 48 year old female who presents with breast cancer. We are asked to see the patient in consultation by Dr. Jana Hakim to evaluate her for a new left breast cancer. The patient is a 48 year old white female who presents with a palpable mass in the central left breast. It measured about 5cm. 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 She does not smoke   Past Surgical History  Breast Biopsy  Left. Oral Surgery   Diagnostic Studies History  Colonoscopy  never Mammogram  within last year Pap Smear  1-5 years ago  Medication History  Medications Reconciled  Social History  Alcohol use  Occasional alcohol use. Caffeine use  Carbonated beverages, Coffee, Tea. No drug use  Tobacco use  Never smoker.  Family History  Heart Disease  Father. Hypertension  Father. Thyroid problems  Father.  Pregnancy / Birth History Age at menarche  33 years. Contraceptive History  Intrauterine device. Gravida  2 Irregular periods  Length (months) of breastfeeding  3-6 Maternal age  29-35 Para  2  Other Problems Lump In Breast     Review of Systems  General Present- Night Sweats. Not Present- Appetite Loss, Chills, Fatigue, Fever, Weight Gain and Weight Loss. Skin Not Present- Change in Wart/Mole, Dryness, Hives, Jaundice, New Lesions, Non-Healing Wounds, Rash and Ulcer. HEENT Not Present- Earache, Hearing Loss, Hoarseness, Nose Bleed, Oral Ulcers, Ringing in the Ears, Seasonal Allergies, Sinus Pain, Sore Throat, Visual Disturbances, Wears glasses/contact lenses and Yellow Eyes. Respiratory Not Present- Bloody sputum, Chronic Cough, Difficulty Breathing, Snoring and Wheezing. Breast Present- Breast Mass. Not Present- Breast  Pain, Nipple Discharge and Skin Changes. Cardiovascular Not Present- Chest Pain, Difficulty Breathing Lying Down, Leg Cramps, Palpitations, Rapid Heart Rate, Shortness of Breath and Swelling of Extremities. Gastrointestinal Not Present- Abdominal Pain, Bloating, Bloody Stool, Change in Bowel Habits, Chronic diarrhea, Constipation, Difficulty Swallowing, Excessive gas, Gets full quickly at meals, Hemorrhoids, Indigestion, Nausea, Rectal Pain and Vomiting. Female Genitourinary Not Present- Frequency, Nocturia, Painful Urination, Pelvic Pain and Urgency. Musculoskeletal Not Present- Back Pain, Joint Pain, Joint Stiffness, Muscle Pain, Muscle Weakness and Swelling of Extremities. Neurological Not Present- Decreased Memory, Fainting, Headaches, Numbness, Seizures, Tingling, Tremor, Trouble walking and Weakness. Psychiatric Not Present- Anxiety, Bipolar, Change in Sleep Pattern, Depression, Fearful and Frequent crying. Endocrine Not Present- Cold Intolerance, Excessive Hunger, Hair Changes, Heat Intolerance, Hot flashes and New Diabetes. Hematology Not Present- Blood Thinners, Easy Bruising, Excessive bleeding, Gland problems, HIV and Persistent Infections.   Physical Exam  General Mental Status-Alert. General Appearance-Consistent with stated age. Hydration-Well hydrated. Voice-Normal.  Head and Neck Head-normocephalic, atraumatic with no lesions or palpable masses. Trachea-midline. Thyroid Gland Characteristics - normal size and consistency.  Eye Eyeball - Bilateral-Extraocular movements intact. Sclera/Conjunctiva - Bilateral-No scleral icterus.  Chest and Lung Exam Chest and lung exam reveals -quiet, even and easy respiratory effort with no use of accessory muscles and on auscultation, normal breath sounds, no adventitious sounds and normal vocal resonance. Inspection Chest Wall - Normal. Back - normal.  Breast Note: There is a 5cm palpable mass in the central left  breast. There is a 3cm area of density in the central right breast. There is no palpable axillary, supraclavicular, or cervical lymphadenopathy  Cardiovascular Cardiovascular examination reveals -normal heart sounds, regular rate and rhythm with no murmurs and normal pedal pulses bilaterally.  Abdomen Inspection Inspection of the abdomen reveals - No Hernias. Skin - Scar - no surgical scars. Palpation/Percussion Palpation and Percussion of the abdomen reveal - Soft, Non Tender, No Rebound tenderness, No Rigidity (guarding) and No hepatosplenomegaly. Auscultation Auscultation of the abdomen reveals - Bowel sounds normal.  Neurologic Neurologic evaluation reveals -alert and oriented x 3 with no impairment of recent or remote memory. Mental Status-Normal.  Musculoskeletal Normal Exam - Left-Upper Extremity Strength Normal and Lower Extremity Strength Normal. Normal Exam - Right-Upper Extremity Strength Normal and Lower Extremity Strength Normal.  Lymphatic Head & Neck  General Head & Neck Lymphatics: Bilateral - Description - Normal. Axillary  General Axillary Region: Bilateral - Description - Normal. Tenderness - Non Tender. Femoral & Inguinal  Generalized Femoral & Inguinal Lymphatics: Bilateral - Description - Normal. Tenderness - Non Tender.    Assessment & Plan  MALIGNANT NEOPLASM OF CENTRAL PORTION OF LEFT BREAST IN FEMALE, ESTROGEN RECEPTOR POSITIVE (C50.112) Impression: The patient appears to have a large lobular cancer in the central left breast. She will need an MRI because of the lobular nature. Because of the size she will need a mastectomy and sentinel node biopsy. I have discussed with her the risks and benefits of the surgery and she understands and wishes to proceed. She is interested in reconstruction so I will refer her to Dr. [name]  She is s/p left mastectomy with multiple positive nodes. She will need chemo. She is here today for port  placement.

## 2018-11-02 NOTE — Telephone Encounter (Signed)
Called patient to confirm appointment scheduled for tomorrow. Patient answered the following questions: °1.Has the patient traveled outside of the state of [place] at all within the past 6 weeks? No °2.Does the patient have a fever or cough at all? No °3.Has the patient been tested for COVID? Had a positive COVID test? No °4. Has the patient been in contact with anyone who has tested positive? No ° °

## 2018-11-02 NOTE — Transfer of Care (Signed)
Immediate Anesthesia Transfer of Care Note  Patient: [name]  Procedure(s) Performed: Procedure(s) (LRB): INSERTION PORT-A-CATH WITH ULTRASOUND (Right)  Patient Location: PACU  Anesthesia Type: General  Level of Consciousness: awake, oriented, sedated and patient cooperative  Airway & Oxygen Therapy: Patient Spontanous Breathing and Patient connected to face mask oxygen  Post-op Assessment: Report given to PACU RN and Post -op Vital signs reviewed and stable  Post vital signs: Reviewed and stable  Complications: No apparent anesthesia complications Last Vitals:  Vitals Value Taken Time  BP 110/67 11/02/2018  9:45 AM  Temp    Pulse 76 11/02/2018  9:47 AM  Resp 12 11/02/2018  9:47 AM  SpO2 99 % 11/02/2018  9:47 AM  Vitals shown include unvalidated device data.  Last Pain:  Vitals:   11/02/18 0930  TempSrc:   PainSc: 5

## 2018-11-02 NOTE — Anesthesia Postprocedure Evaluation (Signed)
Anesthesia Post Note  Patient: [name]  Procedure(s) Performed: INSERTION PORT-A-CATH WITH ULTRASOUND (Right Chest)     Patient location during evaluation: PACU Anesthesia Type: General Level of consciousness: awake and alert Pain management: pain level controlled Vital Signs Assessment: post-procedure vital signs reviewed and stable Respiratory status: spontaneous breathing, nonlabored ventilation, respiratory function stable and patient connected to nasal cannula oxygen Cardiovascular status: blood pressure returned to baseline and stable Postop Assessment: no apparent nausea or vomiting Anesthetic complications: no    Last Vitals:  Vitals:   11/02/18 0945 11/02/18 1000  BP: 110/67   Pulse: 73   Resp: 17   Temp:    SpO2: 100% 100%    Last Pain:  Vitals:   11/02/18 1000  TempSrc:   PainSc: 2                  Masayo Fera S

## 2018-11-02 NOTE — Anesthesia Procedure Notes (Signed)
Procedure Name: LMA Insertion Date/Time: 11/02/2018 8:02 AM Performed by: Suan Halter, CRNA Pre-anesthesia Checklist: Patient identified, Emergency Drugs available, Suction available and Patient being monitored Patient Re-evaluated:Patient Re-evaluated prior to induction Oxygen Delivery Method: Circle system utilized Preoxygenation: Pre-oxygenation with 100% oxygen Induction Type: IV induction Ventilation: Mask ventilation without difficulty LMA: LMA inserted LMA Size: 4.0 Number of attempts: 1 Airway Equipment and Method: Bite block Placement Confirmation: positive ETCO2 Tube secured with: Tape Dental Injury: Teeth and Oropharynx as per pre-operative assessment

## 2018-11-02 NOTE — Op Note (Signed)
11/02/2018  9:05 AM  PATIENT:  [name]  48 y.o. female  PRE-OPERATIVE DIAGNOSIS:  LEFT BREAST CANCER  POST-OPERATIVE DIAGNOSIS:  LEFT BREAST CANCER  PROCEDURE:  Procedure(s): INSERTION PORT-A-CATH WITH ULTRASOUND (Right IJ)  SURGEON:  Surgeon(s) and Role:    * Jovita Kussmaul, MD - Primary  PHYSICIAN ASSISTANT:   ASSISTANTS: none   ANESTHESIA:   local and general  EBL:  10 mL   BLOOD ADMINISTERED:none  DRAINS: none   LOCAL MEDICATIONS USED:  MARCAINE     SPECIMEN:  No Specimen  DISPOSITION OF SPECIMEN:  N/A  COUNTS:  YES  TOURNIQUET:  * No tourniquets in log *  DICTATION: .Dragon Dictation   After informed consent was obtained the patient was brought to the operating room and placed in the supine position on the operating table.  After adequate induction of general anesthesia a roll was placed between the patient's shoulder blades to extend the shoulder slightly.  The right chest and neck area were then prepped with ChloraPrep, allowed to dry, and draped in usual sterile manner.  An appropriate timeout was performed.  The area lateral to the bend of the clavicle on the right chest was infiltrated with quarter percent Marcaine.  The patient was placed in Trendelenburg position.  A large bore needle from the Port-A-Cath kit was used to slide beneath the bend of the clavicle heading towards the sternal notch and in doing so I was able to access the right subclavian vein.  A wire was fed through the needle but the wire would not feed down into the heart.  After several attempts I decided to move to the right internal jugular vein.  I used the ultrasound probe to identify the right internal jugular vein and carotid artery.  Was able to access the right internal jugular vein using the ultrasound to confirm placement.  A wire was then fed through the needle using the Seldinger technique without difficulty.  The wire was confirmed in the central venous system using  real-time fluoroscopy.  Next a small incision was made on the right chest wall.  The incision was carried through the skin and subcutaneous tissue sharply with electrocautery.  A subcutaneous pocket was then created inferior to the incision by blunt finger dissection.  Next a hemostat and tunneling device were used to tunnel through the subcutaneous tissue to connect the incision on the right chest wall with the wire entry site on the right neck.  The tubing was then brought through this tunnel.  The tubing was placed on the reservoir and the reservoir was placed in the pocket.  The length of the tubing was again estimated using real-time fluoroscopy.  The tubing was cut to the appropriate length.  Next a sheath and dilator were fed over the wire using the Seldinger technique without difficulty.  The dilator and wire were removed from the patient.  The tubing was fed through the sheath as far as it would go and then gently held in place while the sheath was cracked and separated.  Another real-time fluoroscopy image showed the tip of the catheter to be in the distal superior vena cava.  The tubing was then permanently anchored to the reservoir.  The reservoir was anchored in the pocket with two 2-0 Prolene stitches.  The port was then aspirated and it aspirated blood easily.  The port was then flushed initially with a dilute heparin solution and then with a more concentrated heparin solution.  The subcutaneous  tissue overlying the port was closed with interrupted 3-0 Vicryl stitches.  The skin incision of the neck was closed with an interrupted 4-0 Monocryl subcuticular stitch.  The incision on the chest wall was closed with a running 4-0 Monocryl subcuticular stitch.  Dermabond dressings were applied.  The patient tolerated the procedure well.  At the end of the case all needle sponge and instrument counts were correct.  The patient was then awakened and taken to recovery in stable condition.  PLAN OF CARE:  Discharge to home after PACU  PATIENT DISPOSITION:  PACU - hemodynamically stable.   Delay start of Pharmacological VTE agent (>24hrs) due to surgical blood loss or risk of bleeding: not applicable

## 2018-11-02 NOTE — Anesthesia Preprocedure Evaluation (Signed)
Anesthesia Evaluation  Patient identified by MRN, date of birth, ID band Patient awake    Reviewed: Allergy & Precautions, NPO status , Patient's Chart, lab work & pertinent test results  Airway Mallampati: II  TM Distance: >3 FB Neck ROM: Full    Dental no notable dental hx.    Pulmonary neg pulmonary ROS,    Pulmonary exam normal breath sounds clear to auscultation       Cardiovascular negative cardio ROS Normal cardiovascular exam Rhythm:Regular Rate:Normal     Neuro/Psych negative neurological ROS  negative psych ROS   GI/Hepatic negative GI ROS, Neg liver ROS,   Endo/Other  negative endocrine ROS  Renal/GU negative Renal ROS  negative genitourinary   Musculoskeletal negative musculoskeletal ROS (+)   Abdominal   Peds negative pediatric ROS (+)  Hematology negative hematology ROS (+)   Anesthesia Other Findings   Reproductive/Obstetrics negative OB ROS                             Anesthesia Physical Anesthesia Plan  ASA: II  Anesthesia Plan: General   Post-op Pain Management:    Induction: Intravenous and Rapid sequence  PONV Risk Score and Plan: 3 and Ondansetron, Dexamethasone and Treatment may vary due to age or medical condition  Airway Management Planned: Oral ETT  Additional Equipment:   Intra-op Plan:   Post-operative Plan: Extubation in OR  Informed Consent: I have reviewed the patients History and Physical, chart, labs and discussed the procedure including the risks, benefits and alternatives for the proposed anesthesia with the patient or authorized representative who has indicated his/her understanding and acceptance.     Dental advisory given  Plan Discussed with: CRNA and Surgeon  Anesthesia Plan Comments:         Anesthesia Quick Evaluation

## 2018-11-03 ENCOUNTER — Encounter: Payer: Self-pay | Admitting: General Practice

## 2018-11-03 ENCOUNTER — Encounter (HOSPITAL_BASED_OUTPATIENT_CLINIC_OR_DEPARTMENT_OTHER): Payer: Self-pay | Admitting: General Surgery

## 2018-11-03 ENCOUNTER — Ambulatory Visit (INDEPENDENT_AMBULATORY_CARE_PROVIDER_SITE_OTHER): Payer: 59 | Admitting: Plastic Surgery

## 2018-11-03 VITALS — BP 116/70 | HR 76 | Temp 98.4°F | Ht 66.0 in | Wt 140.4 lb

## 2018-11-03 DIAGNOSIS — Z170 Estrogen receptor positive status [ER+]: Secondary | ICD-10-CM

## 2018-11-03 DIAGNOSIS — Z9010 Acquired absence of unspecified breast and nipple: Secondary | ICD-10-CM | POA: Insufficient documentation

## 2018-11-03 DIAGNOSIS — Z9012 Acquired absence of left breast and nipple: Secondary | ICD-10-CM

## 2018-11-03 DIAGNOSIS — C50412 Malignant neoplasm of upper-outer quadrant of left female breast: Secondary | ICD-10-CM

## 2018-11-03 NOTE — Progress Notes (Signed)
   Subjective:    Patient ID: [name], female    DOB: [birth year], 48 y.o.   MRN: [id number]  The patient is a 48 year old female here with her husband for follow-up on her left breast reconstruction.  She had her port placed on the right side yesterday.  Overall she is doing well.  She will be starting chemotherapy soon.  We have not done any expansion yet.  Her drain output has been minimal.  Her incisions are looking good and healthy. Her flaps look very good as well.     Review of Systems  Constitutional: Positive for activity change.  HENT: Negative.   Eyes: Negative.   Respiratory: Negative.   Cardiovascular: Negative.   Gastrointestinal: Negative.   Endocrine: Negative.   Genitourinary: Negative.   Musculoskeletal: Negative.   Hematological: Negative.   Psychiatric/Behavioral: Negative.        Objective:   Physical Exam Vitals signs and nursing note reviewed.  Constitutional:      Appearance: Normal appearance.  HENT:     Head: Normocephalic.  Cardiovascular:     Rate and Rhythm: Normal rate.  Pulmonary:     Effort: Pulmonary effort is normal.  Neurological:     Mental Status: She is alert.  Psychiatric:        Mood and Affect: Mood normal.        Behavior: Behavior normal.       Assessment & Plan:  Malignant neoplasm of upper-outer quadrant of left breast in female, estrogen receptor positive (HCC)  Acquired absence of left breast  We placed injectable saline in the Expander using a sterile technique: Left: 50 cc for a total of 50 / 300 cc The left drain was removed. Follow-up in 2 weeks.

## 2018-11-03 NOTE — Progress Notes (Signed)
[facility] Spiritual Care Note  Followed up with [name] by phone today. Per pt, she was much more optimistic today than last time we talked, citing relief at finding that port surgery was indeed less challenging than mastectomy (despite initial pain) and joyful anticipation at having drain removed so that she can shower. Per pt, she is using communication and coping tips from our first conversation with much success, again feeling relieved and empowered. We talked through her questions and concerns re starting chemo to her satisfaction as well. Plan to f/u by phone next week per her request.   Chaplain Lorrin Jackson, [place], [facility] Pager [phone] Voicemail [phone]

## 2018-11-07 ENCOUNTER — Ambulatory Visit: Payer: Self-pay | Admitting: Rehabilitation

## 2018-11-07 ENCOUNTER — Other Ambulatory Visit: Payer: Self-pay

## 2018-11-07 ENCOUNTER — Ambulatory Visit: Payer: 59 | Attending: Oncology | Admitting: Rehabilitation

## 2018-11-07 ENCOUNTER — Encounter: Payer: Self-pay | Admitting: Rehabilitation

## 2018-11-07 DIAGNOSIS — M25612 Stiffness of left shoulder, not elsewhere classified: Secondary | ICD-10-CM | POA: Insufficient documentation

## 2018-11-07 DIAGNOSIS — Z9012 Acquired absence of left breast and nipple: Secondary | ICD-10-CM | POA: Diagnosis not present

## 2018-11-07 DIAGNOSIS — R293 Abnormal posture: Secondary | ICD-10-CM | POA: Insufficient documentation

## 2018-11-07 DIAGNOSIS — Z170 Estrogen receptor positive status [ER+]: Secondary | ICD-10-CM | POA: Insufficient documentation

## 2018-11-07 DIAGNOSIS — C50412 Malignant neoplasm of upper-outer quadrant of left female breast: Secondary | ICD-10-CM | POA: Insufficient documentation

## 2018-11-07 NOTE — Therapy (Signed)
[place], [place], [address] Phone: [phone]   Fax:  [phone]  Physical Therapy Evaluation  Patient Details  Name: [name] MRN: [id number] Date of Birth: 1970-09-09 Referring Provider (PT): Dr. Donne Hazel   Encounter Date: 11/07/2018  PT End of Session - 11/07/18 2049    Visit Number  1    Number of Visits  7    Date for PT Re-Evaluation  12/19/18    PT Start Time  0905    PT Stop Time  0953    PT Time Calculation (min)  48 min    Activity Tolerance  Patient tolerated treatment well    Behavior During Therapy  [facility] for tasks assessed/performed       Past Medical History:  Diagnosis Date  . Cancer ([place]) 10/18/2018   left breast cancer    Past Surgical History:  Procedure Laterality Date  . BREAST RECONSTRUCTION WITH PLACEMENT OF TISSUE EXPANDER AND FLEX HD (ACELLULAR HYDRATED DERMIS) Left 10/18/2018   Procedure: IMMEDIATEVBREAST RECONSTRUCTION WITH PLACEMENT OF TISSUE EXPANDER AND FLEX HD (ACELLULAR HYDRATED DERMIS);  Surgeon: Wallace Going, DO;  Location: [facility];  Service: Plastics;  Laterality: Left;  Marland Kitchen MASTECTOMY    . MASTECTOMY W/ SENTINEL NODE BIOPSY Left 10/18/2018   Procedure: LEFT MASTECTOMY WITH SENTINEL LYMPH NODE BIOPSY;  Surgeon: Jovita Kussmaul, MD;  Location: [facility];  Service: General;  Laterality: Left;  . PORTACATH PLACEMENT Right 11/02/2018   Procedure: INSERTION PORT-A-CATH WITH ULTRASOUND;  Surgeon: Jovita Kussmaul, MD;  Location: [facility];  Service: General;  Laterality: Right;    There were no vitals filed for this visit.   Subjective Assessment - 11/07/18 0905    Subjective  my arm is just hurting.  think I'm doing well.  Just got a port on the Rt side.     Pertinent History  left mastectomy with SLNB (5 of 9 positive) by Dr. Marlou Starks followed by immediate reconstruction with tissue expander by Dr.  [name] on 10/18/2018. Plan will consist of chemotherapy starting 11/21/18, adjuvant radiation, and then antiestrogens, possibly including goserelin    Limitations  Lifting    Patient Stated Goals  get back to normal UE use    Currently in Pain?  No/denies   this morning it was a 7/10 for about 54minutes   Pain Location  Arm    Pain Orientation  Left    Pain Descriptors / Indicators  Aching;Burning;Tightness    Pain Type  Surgical pain         [facility] PT Assessment - 11/07/18 0001      Assessment   Medical Diagnosis  breast cancer    Referring Provider (PT)  Dr. Donne Hazel    Onset Date/Surgical Date  10/18/18    Hand Dominance  Right    Prior Therapy  no      Precautions   Precaution Comments  lymphedema, cancer history      Restrictions   Weight Bearing Restrictions  No      Balance Screen   Has the patient fallen in the past 6 months  No    Has the patient had a decrease in activity level because of a fear of falling?   No    Is the patient reluctant to leave their home because of a fear of falling?   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  Living Arrangements  Spouse/significant other;Children      Prior Function   Level of Independence  Independent    Vocation  Full time employment    Equities trader of college level physics    Leisure  starting to get back to walking      Cognition   Overall Cognitive Status  Within Functional Limits for tasks assessed      Observation/Other Assessments   Observations  incision healing well, skin healthy,  tightness evident in the superior chest, still with a pad over the drain site    Skin Integrity  no cording       Sensation   Additional Comments  numbness in the axilla      Coordination   Gross Motor Movements are Fluid and Coordinated  Yes      Posture/Postural Control   Posture/Postural Control  Postural limitations    Postural Limitations  Rounded Shoulders;Forward head       ROM / Strength   AROM / PROM / Strength  AROM;PROM      AROM   AROM Assessment Site  Shoulder    Right/Left Shoulder  Right;Left    Right Shoulder Extension  50 Degrees    Right Shoulder Flexion  142 Degrees    Right Shoulder ABduction  142 Degrees    Right Shoulder Internal Rotation  80 Degrees    Right Shoulder External Rotation  90 Degrees    Left Shoulder Extension  50 Degrees    Left Shoulder Flexion  95 Degrees   pulling near drain site   Left Shoulder ABduction  94 Degrees   tightness   Left Shoulder Internal Rotation  85 Degrees    Left Shoulder External Rotation  80 Degrees      PROM   PROM Assessment Site  Shoulder    Right/Left Shoulder  Left        LYMPHEDEMA/ONCOLOGY QUESTIONNAIRE - 11/07/18 0925      Type   Cancer Type  L breast      Surgeries   Mastectomy Date  10/18/18    Sentinel Lymph Node Biopsy Date  10/18/18    Number Lymph Nodes Removed  3      Treatment   Active Chemotherapy Treatment  No    Past Chemotherapy Treatment  No    Active Radiation Treatment  No    Past Radiation Treatment  No    Current Hormone Treatment  No    Past Hormone Therapy  No      What other symptoms do you have   Are you Having Heaviness or Tightness  No      Lymphedema Assessments   Lymphedema Assessments  Upper extremities      Right Upper Extremity Lymphedema   15 cm Proximal to Olecranon Process  28 cm    10 cm Proximal to Olecranon Process  25.2 cm    Olecranon Process  24.5 cm    15 cm Proximal to Ulnar Styloid Process  23.9 cm    10 cm Proximal to Ulnar Styloid Process  20.8 cm    Just Proximal to Ulnar Styloid Process  15.5 cm    Across Hand at [employer]  19.9 cm    At [place] of 2nd Digit  6.3 cm      Left Upper Extremity Lymphedema   15 cm Proximal to Olecranon Process  27 cm    10 cm Proximal to Olecranon Process  25.8 cm  Olecranon Process  23.1 cm    15 cm Proximal to Ulnar Styloid Process  23 cm    10 cm Proximal to Ulnar Styloid  Process  20 cm    Just Proximal to Ulnar Styloid Process  15.2 cm    Across Hand at [employer]  19.7 cm    At [place] of 2nd Digit  7 cm          Quick Dash - 11/07/18 0001    Open a tight or new jar  Unable    Do heavy household chores (wash walls, wash floors)  Unable    Carry a shopping bag or briefcase  Unable    Wash your back  Unable    Use a knife to cut food  Unable    Recreational activities in which you take some force or impact through your arm, shoulder, or hand (golf, hammering, tennis)  Unable    During the past week, to what extent has your arm, shoulder or hand problem interfered with your normal social activities with family, friends, neighbors, or groups?  Extremely    During the past week, to what extent has your arm, shoulder or hand problem limited your work or other regular daily activities  Extremely    Arm, shoulder, or hand pain.  Severe    Tingling (pins and needles) in your arm, shoulder, or hand  Severe    Difficulty Sleeping  Severe difficulty    DASH Score  93.18 %        Objective measurements completed on examination: See above findings.      [facility] Adult PT Treatment/Exercise - 11/07/18 0001      Self-Care   Self-Care  Other Self-Care Comments    Other Self-Care Comments   lymphedema education, compression garments, risk reduction      Exercises   Exercises  Other Exercises    Other Exercises   gave patient supine post op stretches handout with behind the head a bit too hard starting with ER at 90deg of abduction and working towards behind the head. scapular retraction added             PT Education - 11/07/18 2048    Education Details  HEP, POC, lymphedema and risk reduction    Person(s) Educated  Patient    Methods  Explanation;Demonstration;Tactile cues;Verbal cues;Handout    Comprehension  Verbalized understanding;Returned demonstration;Verbal cues required;Tactile cues required          PT Long Term Goals - 11/07/18  2055      PT LONG TERM GOAL #1   Title  Pt will improve Lt shoulder AROM to WNL without pain near the drain site    Time  6    Period  Weeks    Status  New      PT LONG TERM GOAL #2   Title  Pt will be knowledgeable about lymphedema signs and symptoms and risk reduction    Status  Achieved      PT LONG TERM GOAL #3   Title  Pt will report no increased pain with sleeping    Time  6    Period  Weeks    Status  New      PT LONG TERM GOAL #4   Title  Pt will be educated on strength ABC program or similar via email for strength and mobility    Time  6    Period  Weeks    Status  New             Plan - 11/07/18 2049    Clinical Impression Statement  Pt presents today post Lt mastectomy with SLNB and expander placement on 10/18/18.  Pt with very guarded posture and movement in the Lt UE with most pain and discomfort in near the drain site and lateral chest wall with movements and going from sit to supine.  Pt will begin chemotherapy followed by radiation and will need PT visits to maximize her shoulder motion and decrease risk of shoulder dysfunction.      Examination-Activity Limitations  Lift;Reach Overhead    Examination-Participation Restrictions  Yard Work;Community Activity    Stability/Clinical Decision Making  Stable/Uncomplicated    Clinical Decision Making  Low    Rehab Potential  Excellent    PT Frequency  1x / week    PT Duration  6 weeks    PT Treatment/Interventions  ADLs/Self Care Home Management;Therapeutic exercise;Neuromuscular re-education;Patient/family education;Manual lymph drainage;Passive range of motion    PT Next Visit Plan  phone call check up in a week for telehealth or email updates.  continue shoulder ROM    PT Home Exercise Plan  post op exercises    Recommended Other Services  compression for flying    Consulted and Agree with Plan of Care  Patient       Patient will benefit from skilled therapeutic intervention in order to improve the following  deficits and impairments:  Decreased activity tolerance, Decreased knowledge of use of DME, Decreased range of motion, Impaired UE functional use, Pain, Postural dysfunction  Visit Diagnosis: Malignant neoplasm of upper-outer quadrant of left breast in female, estrogen receptor positive ([place])  Acquired absence of left breast  Stiffness of left shoulder, not elsewhere classified  Abnormal posture     Problem List Patient Active Problem List   Diagnosis Date Noted  . 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 Malignant neoplasm of upper-outer quadrant of left breast in female, estrogen receptor positive ([place]) 10/10/2018    Shan Levans, PT 11/07/2018, 8:58 PM  [place], [place], [address] Phone: [phone]   Fax:  [phone]  Name: Izabela Ow MRN: [id number] Date of Birth: 1971-03-05

## 2018-11-07 NOTE — Patient Instructions (Signed)
gave patient supine post op stretches handout with behind the head a bit too hard starting with ER at 90deg of abduction and working towards behind the head. scapular retraction added  To obtain compression before flying to [place] or earlier if needed

## 2018-11-08 ENCOUNTER — Telehealth: Payer: Self-pay | Admitting: *Deleted

## 2018-11-08 NOTE — Telephone Encounter (Signed)
Called and verified and confirmed appts.

## 2018-11-10 MED ORDER — LORATADINE 10 MG PO TABS: 10.0000 mg | ORAL_TABLET | Freq: Every day | ORAL | 0 refills | Status: DC

## 2018-11-13 ENCOUNTER — Encounter: Payer: Self-pay | Admitting: *Deleted

## 2018-11-14 ENCOUNTER — Encounter (HOSPITAL_BASED_OUTPATIENT_CLINIC_OR_DEPARTMENT_OTHER): Payer: Self-pay | Admitting: General Surgery

## 2018-11-14 ENCOUNTER — Encounter: Payer: Self-pay | Admitting: General Practice

## 2018-11-14 NOTE — Progress Notes (Signed)
[facility] Spiritual Care Note  Spoke with [name] by phone for general check-in and, per Dr Magrinat's request, to pass along his suggestion of considering cold cap. [name] plans to discuss this possibility with him more at her appt on Friday. Meanwhile, she is finding communication strategies with family and friends that are reducing her stress and increasing her sense of being cared for, which is a notable strength and relief. Per her request, we plan to f/u by phone at the beginning and end of next week (before and after 1st chemo).   [place], [place], [facility] Pager [phone] Voicemail [phone]

## 2018-11-16 ENCOUNTER — Telehealth: Payer: Self-pay | Admitting: Rehabilitation

## 2018-11-16 ENCOUNTER — Telehealth: Payer: Self-pay | Admitting: Plastic Surgery

## 2018-11-16 NOTE — Progress Notes (Signed)
[place]  [phone] [phone]    ID: Lowella Kindley Lance DOB: Jul 02, 1971  MR#: [id number]  EOF#:[id number]  Patient Care Team: Orpah Melter, MD as PCP - General (Family Medicine) Mauro Kaufmann, RN as Oncology Nurse Navigator Rockwell Germany, RN as Oncology Nurse Navigator Jovita Kussmaul, MD as Consulting Physician (General Surgery) Menelik Mcfarren, Virgie Dad, MD as Consulting Physician (Oncology) Kyung Rudd, MD as Consulting Physician (Radiation Oncology) Aloha Gell, MD as Consulting Physician (Obstetrics and Gynecology) Chauncey Cruel, MD OTHER MD:   CHIEF COMPLAINT: Estrogen receptor positive breast cancer  CURRENT TREATMENT: Adjuvant chemotherapy   INTERVAL HISTORY: Andreyah was seen today for follow-up and treatment of her estrogen receptor positive breast cancer.   She had significant pain from her surgery initially, but that is now much better.  She does have her implant in place and she tells me she has about 4 more fails to go before she reaches the cup size that will be equivalent to the right breast.  She of course will not have the definitive implant placed until after the completion of radiation  She will begin chemotherapy consisting of doxorubicin and cyclophosphamide in dose dense fashion x4 starting 11/22/2018.   Since her last visit here, she underwent a port placement on 11/02/2018.    REVIEW OF SYSTEMS: Sidonie has been able to isolate herself fairly well amid the COVID-19 pandemic. Both her and her husband are working from home. Her husband is the one that does the grocery shopping, but he takes appropriate precautions against the spread. For exercise, she walks twice per day around her neighborhood. She is also currently doing physical therapy. [place] continues to heal from her surgery. She has plans to undergo reconstruction surgery after radiation.   The patient denies unusual headaches, visual changes,  nausea, vomiting, or dizziness. There has been no unusual cough, phlegm production, or pleurisy. This been no change in bowel or bladder habits. The patient denies unexplained fatigue or unexplained weight loss, bleeding, rash, or fever. A detailed review of systems was otherwise noncontributory.    HISTORY OF CURRENT ILLNESS: From the original intake note:  "Lochlyn" presented with left nipple retraction for 1 week with retroareolar mass in the upper outer quadrant. She underwent bilateral diagnostic mammography with CAD and left breast ultrasonography at [facility] on 09/25/2018 showing: 7 mm oval duct in the left breast; no significant abnormalities in the left axilla. She also underwent additional imaging with left breast digital diagnostic mammogram on 10/02/2018 showing: new 0.4 cm cluster of grouped heterogeneous calcifications in the left breast.   Accordingly on 10/02/2018 she proceeded to biopsy of the left breast area in question. The pathology [id number]) from this procedure showed: mammary carcinoma in situ at the 3 o'clock mass, with possible focal microinvasion; invasive and in situ mammary carcinoma at the 12 o'clock mass, immunostain for E-cadherin is negative in the tumor cells, consistent with a lobular phenotype. Prognostic indicators significant for: estrogen receptor, 90% positive, with moderate staining intensity and progesterone receptor, 100% positive, with strong staining intensity. Proliferation marker Ki67 at <1%. HER2 negative (1+).   The patient's subsequent history is as detailed above.   PAST MEDICAL HISTORY: Past Medical History:  Diagnosis Date  . Cancer ([place]) 10/18/2018   left breast cancer    PAST SURGICAL HISTORY: Past Surgical History:  Procedure Laterality Date  . BREAST RECONSTRUCTION WITH PLACEMENT OF TISSUE EXPANDER AND FLEX HD (ACELLULAR HYDRATED DERMIS) Left 10/18/2018   Procedure: IMMEDIATEVBREAST RECONSTRUCTION WITH  PLACEMENT OF TISSUE EXPANDER AND FLEX HD  (ACELLULAR HYDRATED DERMIS);  Surgeon: Wallace Going, DO;  Location: [place];  Service: Plastics;  Laterality: Left;  Marland Kitchen MASTECTOMY    . MASTECTOMY W/ SENTINEL NODE BIOPSY Left 10/18/2018   Procedure: LEFT MASTECTOMY WITH SENTINEL LYMPH NODE BIOPSY;  Surgeon: Jovita Kussmaul, MD;  Location: [place];  Service: General;  Laterality: Left;  . PORTACATH PLACEMENT Right 11/02/2018   Procedure: INSERTION PORT-A-CATH WITH ULTRASOUND;  Surgeon: Jovita Kussmaul, MD;  Location: [place];  Service: General;  Laterality: Right;    FAMILY HISTORY Family History  Problem Relation Age of Onset  . Breast cancer Neg Hx   . Ovarian cancer Neg Hx    As of March 2020, patient's father is living and healthy at age 48. Patient's mother is also living and healthy at age 48. The patient denies a family hx of breast or ovarian cancer. She has 1 sister.  GYNECOLOGIC HISTORY:  No LMP recorded. (Menstrual status: IUD). Menarche: 48 years old Age at first live birth: 48 years old [place] 2 LMP in 2010 Contraceptive: Mirena IUD has been replaced by a ParaGard IUD as of March 2020 , reports she used birth control pills between ages 48 to 48 HRT n/a  Hysterectomy? no BSO? 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  She has a PhD in Probation officer.  Husband [name] is an Art gallery manager. She lives at home with her husband and 3 children. She has two children, [name] age 48 and [name] age 27. Husband [name] has one daughter, Quinlan Mcfall age 26, who lives with them.    ADVANCED DIRECTIVES: In the absence of any documentation to the contrary her husband [name] is her HCPOA.   HEALTH MAINTENANCE: Social History   Tobacco Use  . Smoking status: Never Smoker  . Smokeless tobacco: Never Used  Substance Use Topics  . Alcohol use: Yes    Alcohol/week: 3.0 standard drinks    Types: 3 Standard drinks or  equivalent per week    Comment: social  . Drug use: Never     Colonoscopy: never done  PAP: 08/2017  Bone density: never done   No Known Allergies  Current Outpatient Medications  Medication Sig Dispense Refill  . dexamethasone (DECADRON) 4 MG tablet Take 2 tablets by mouth once a day on the day after chemotherapy and then take 2 tablets two times a day for 2 days. Take with food. 30 tablet 1  . diazepam (VALIUM) 2 MG tablet Take 1 tablet (2 mg total) by mouth every 6 (six) hours as needed for anxiety. 30 tablet 0  . HYDROcodone-acetaminophen (NORCO) 5-325 MG tablet Take 1-2 tablets by mouth every 6 (six) hours as needed for moderate pain. 10 tablet 0  . lidocaine-prilocaine (EMLA) cream Apply to affected area once 30 g 3  . loratadine (CLARITIN) 10 MG tablet Take 1 tablet (10 mg total) by mouth daily. 90 tablet 0  . LORazepam (ATIVAN) 0.5 MG tablet Take 1 tablet (0.5 mg total) by mouth at bedtime as needed (Nausea or vomiting). 30 tablet 0  . prochlorperazine (COMPAZINE) 10 MG tablet Take 1 tablet (10 mg total) by mouth every 6 (six) hours as needed (Nausea or vomiting). 30 tablet 1   No current facility-administered medications for this visit.     OBJECTIVE: Young white woman in no acute distress  Vitals:  11/17/18 0923  BP: 114/65  Pulse: 73  Resp: 18  Temp: 98.1 F (36.7 C)  SpO2: 100%     Body mass index is 22.29 kg/m.   Wt Readings from Last 3 Encounters:  11/17/18 138 lb 1.6 oz (62.6 kg)  11/03/18 140 lb 6.4 oz (63.7 kg)  11/02/18 141 lb 1.5 oz (64 kg)      ECOG FS:1 - Symptomatic but completely ambulatory  Sclerae unicteric, EOMs intact No cervical or supraclavicular adenopathy Lungs no rales or rhonchi Heart regular rate and rhythm Abd soft, nontender, positive bowel sounds MSK no focal spinal tenderness, no upper extremity lymphedema Neuro: nonfocal, well oriented, appropriate affect Breasts: The right breast is benign.  The left breast is status post  mastectomy and expander reconstruction.  The initial cosmetic result is very favorable.  There is no dehiscence, erythema, or swelling.  Both axillae are benign.   LAB RESULTS:  CMP     Component Value Date/Time   NA 141 10/11/2018 0846   K 4.4 10/11/2018 0846   CL 107 10/11/2018 0846   CO2 24 10/11/2018 0846   GLUCOSE 86 10/11/2018 0846   BUN 14 10/11/2018 0846   CREATININE 0.87 10/11/2018 0846   CALCIUM 9.1 10/11/2018 0846   PROT 7.1 10/11/2018 0846   ALBUMIN 4.1 10/11/2018 0846   AST 16 10/11/2018 0846   ALT 18 10/11/2018 0846   ALKPHOS 40 10/11/2018 0846   BILITOT 0.6 10/11/2018 0846   GFRNONAA >60 10/11/2018 0846   GFRAA >60 10/11/2018 0846    No results found for: TOTALPROTELP, ALBUMINELP, A1GS, A2GS, BETS, BETA2SER, GAMS, MSPIKE, SPEI  No results found for: KPAFRELGTCHN, LAMBDASER, [facility]  Lab Results  Component Value Date   WBC 5.9 10/11/2018   NEUTROABS 3.4 10/11/2018   HGB 14.4 10/11/2018   HCT 44.9 10/11/2018   MCV 95.9 10/11/2018   PLT 314 10/11/2018    '@LASTCHEMISTRY' @  No results found for: LABCA2  No components found for: FIEPPI951  No results for input(s): INR in the last 168 hours.  No results found for: LABCA2  No results found for: OAC166  No results found for: AYT016  No results found for: WFU932  No results found for: CA2729  No components found for: HGQUANT  No results found for: CEA1 / No results found for: CEA1   No results found for: AFPTUMOR  No results found for: CHROMOGRNA  No results found for: PSA1  No visits with results within 3 Day(s) from this visit.  Latest known visit with results is:  Appointment on 10/11/2018  Component Date Value Ref Range Status  . WBC Count 10/11/2018 5.9  4.0 - 10.5 K/uL Final  . RBC 10/11/2018 4.68  3.87 - 5.11 MIL/uL Final  . Hemoglobin 10/11/2018 14.4  12.0 - 15.0 g/dL Final  . HCT 10/11/2018 44.9  36.0 - 46.0 % Final  . MCV 10/11/2018 95.9  80.0 - 100.0 fL Final  . MCH  10/11/2018 30.8  26.0 - 34.0 pg Final  . MCHC 10/11/2018 32.1  30.0 - 36.0 g/dL Final  . RDW 10/11/2018 12.6  11.5 - 15.5 % Final  . Platelet Count 10/11/2018 314  150 - 400 K/uL Final  . nRBC 10/11/2018 0.0  0.0 - 0.2 % Final  . Neutrophils Relative % 10/11/2018 57  % Final  . Neutro Abs 10/11/2018 3.4  1.7 - 7.7 K/uL Final  . Lymphocytes Relative 10/11/2018 30  % Final  . Lymphs Abs 10/11/2018 1.8  0.7 -  4.0 K/uL Final  . Monocytes Relative 10/11/2018 8  % Final  . Monocytes Absolute 10/11/2018 0.5  0.1 - 1.0 K/uL Final  . Eosinophils Relative 10/11/2018 4  % Final  . Eosinophils Absolute 10/11/2018 0.3  0.0 - 0.5 K/uL Final  . Basophils Relative 10/11/2018 1  % Final  . Basophils Absolute 10/11/2018 0.0  0.0 - 0.1 K/uL Final  . Immature Granulocytes 10/11/2018 0  % Final  . Abs Immature Granulocytes 10/11/2018 0.01  0.00 - 0.07 K/uL Final   Performed at [facility], [place] [address] [place], [place] [address]  . Sodium 10/11/2018 141  135 - 145 mmol/L Final  . Potassium 10/11/2018 4.4  3.5 - 5.1 mmol/L Final  . Chloride 10/11/2018 107  98 - 111 mmol/L Final  . CO2 10/11/2018 24  22 - 32 mmol/L Final  . Glucose, Bld 10/11/2018 86  70 - 99 mg/dL Final  . BUN 10/11/2018 14  6 - 20 mg/dL Final  . Creatinine 10/11/2018 0.87  0.44 - 1.00 mg/dL Final  . Calcium 10/11/2018 9.1  8.9 - 10.3 mg/dL Final  . Total Protein 10/11/2018 7.1  6.5 - 8.1 g/dL Final  . Albumin 10/11/2018 4.1  3.5 - 5.0 g/dL Final  . AST 10/11/2018 16  15 - 41 U/L Final  . ALT 10/11/2018 18  0 - 44 U/L Final  . Alkaline Phosphatase 10/11/2018 40  38 - 126 U/L Final  . Total Bilirubin 10/11/2018 0.6  0.3 - 1.2 mg/dL Final  . GFR, Est Non Af Am 10/11/2018 >60  >60 mL/min Final  . GFR, Est AFR Am 10/11/2018 >60  >60 mL/min Final  . Anion gap 10/11/2018 10  5 - 15 Final   Performed at [facility], [address] [place], [place] [address]    (this displays the last  labs from the last 3 days)  No results found for: TOTALPROTELP, ALBUMINELP, A1GS, A2GS, BETS, BETA2SER, GAMS, MSPIKE, SPEI (this displays SPEP labs)  No results found for: KPAFRELGTCHN, LAMBDASER, KAPLAMBRATIO (kappa/lambda light chains)  No results found for: HGBA, HGBA2QUANT, HGBFQUANT, HGBSQUAN (Hemoglobinopathy evaluation)   No results found for: LDH  No results found for: IRON, TIBC, IRONPCTSAT (Iron and TIBC)  No results found for: FERRITIN  Urinalysis    Component Value Date/Time   COLORURINE YELLOW 01/27/2008 0235   APPEARANCEUR CLOUDY (A) 01/27/2008 0235   LABSPEC 1.027 01/27/2008 0235   PHURINE 7.0 01/27/2008 0235   GLUCOSEU NEGATIVE 01/27/2008 0235   HGBUR NEGATIVE 01/27/2008 0235   BILIRUBINUR NEGATIVE 01/27/2008 0235   KETONESUR NEGATIVE 01/27/2008 0235   PROTEINUR NEGATIVE 01/27/2008 0235   UROBILINOGEN 0.2 01/27/2008 0235   NITRITE NEGATIVE 01/27/2008 0235   LEUKOCYTESUR SMALL (A) 01/27/2008 0235     STUDIES: Dg Chest 1 View  Result Date: 11/02/2018 CLINICAL DATA:  Elective surgery. EXAM: CHEST  1 VIEW COMPARISON:  Radiographs of February 10, 2012. FINDINGS: The heart size and mediastinal contours are within normal limits. No pneumothorax or pleural effusion is noted. Right internal jugular Port-A-Cath is noted with distal tip in expected position of cavoatrial junction. Right lung is clear. Status post left breast surgery. Mild left basilar subsegmental atelectasis is noted. The visualized skeletal structures are unremarkable. IMPRESSION: Status post left breast surgery. Mild left basilar subsegmental atelectasis. Interval placement of right internal jugular Port-A-Cath. Electronically Signed   By: Marijo Conception M.D.   On: 11/02/2018 09:49   Dg Fluoro Guide Cv Line-no Report  Result Date: 11/02/2018 Fluoroscopy was utilized by the requesting physician.  No radiographic interpretation.     ELIGIBLE FOR AVAILABLE RESEARCH PROTOCOL: no   ASSESSMENT: 48 y.o.  [facility] woman status post left breast upper outer quadrant biopsy 10/02/2018 for a clinical T2N0, stage IB invasive lobular breast cancer, grade not stated, estrogen and progesterone receptor positive, HER-2 not amplified, with an MIB-1 of less than 1%  (1) status post left mastectomy and sentinel lymph node sampling 10/18/2018 for a pT3 pN2, stage IIA invasive lobular breast cancer, grade 2, with close but negative margins  (a) 5 of 9 lymph nodes removed had micrometastatic deposits of tumor  (b) immediate expander placement  (2) chemotherapy will consist of doxorubicin and cyclophosphamide in dose dense fashion x4, starting 11/22/2018, to be followed by weekly paclitaxel x12  (3) adjuvant radiation to follow  (4) antiestrogens to start at the completion of local treatment  (a) consider goserelin/anastrozole   PLAN: Corby met extensively with the chemotherapy teaching nurse today and she had a tumor of the treatment..  She knows how to take her supportive medications and is aware of the need to use the numbing cream 1 to 2 hours prior to coming for treatment.  She will have an echocardiogram later today which of course we will review before she gets treated.  She expressed an interest in having an echocardiogram repeated sometime after she completes all her radiation treatments.  That is not unreasonable although it is not of course standard of care  They are doing a good job of isolating themselves through the pandemic.  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  We will have to discuss that in more detail at that time  Her first treatment will be on a Wednesday and then we are switching back to Tuesdays as originally planned.  She will see me on day 8 of her first cycle to make sure her nadir is adequate and not excessive and to decide whether  she will need prophylactic antibiotics after each AC dose.  She has so far decided against cold cap and she would not mind if she became permanently menopausal  She knows to call for any other issue that may develop before the next visit.  Braxtyn Bojarski, Virgie Dad, MD  11/17/18 9:42 AM Medical Oncology and Hematology [facility] [address] [place], [place] [address] Tel. [phone]    Fax. [phone]   I, Jacqualyn Posey am acting as a Education administrator for Chauncey Cruel, MD.   I, Lurline Del MD, have reviewed the above documentation for accuracy and completeness, and I agree with the above.

## 2018-11-16 NOTE — Telephone Encounter (Signed)
LM for PT follow up visits

## 2018-11-16 NOTE — Telephone Encounter (Signed)
Called patient to confirm appointment scheduled for tomorrow. Patient answered the following questions: °1.Has the patient traveled outside of the state of [place] at all within the past 6 weeks? No °2.Does the patient have a fever or cough at all? No °3.Has the patient been tested for COVID? Had a positive COVID test? No °4. Has the patient been in contact with anyone who has tested positive? No ° °

## 2018-11-17 ENCOUNTER — Other Ambulatory Visit: Payer: Self-pay

## 2018-11-17 ENCOUNTER — Other Ambulatory Visit (HOSPITAL_COMMUNITY): Payer: 59

## 2018-11-17 ENCOUNTER — Encounter: Payer: Self-pay | Admitting: Plastic Surgery

## 2018-11-17 ENCOUNTER — Inpatient Hospital Stay: Payer: 59 | Attending: Oncology | Admitting: Oncology

## 2018-11-17 ENCOUNTER — Ambulatory Visit (HOSPITAL_COMMUNITY)
Admission: RE | Admit: 2018-11-17 | Discharge: 2018-11-17 | Disposition: A | Discharge status: Home / Self Care | Payer: 59 | Source: Ambulatory Visit | Attending: Oncology | Admitting: Oncology

## 2018-11-17 ENCOUNTER — Other Ambulatory Visit: Payer: 59

## 2018-11-17 ENCOUNTER — Inpatient Hospital Stay: Payer: 59

## 2018-11-17 ENCOUNTER — Ambulatory Visit (INDEPENDENT_AMBULATORY_CARE_PROVIDER_SITE_OTHER): Payer: 59 | Admitting: Plastic Surgery

## 2018-11-17 VITALS — BP 107/75 | HR 64 | Temp 97.8°F | Ht 66.0 in | Wt 138.0 lb

## 2018-11-17 VITALS — BP 114/65 | HR 73 | Temp 98.1°F | Resp 18 | Ht 66.0 in | Wt 138.1 lb

## 2018-11-17 DIAGNOSIS — Z5189 Encounter for other specified aftercare: Secondary | ICD-10-CM | POA: Insufficient documentation

## 2018-11-17 DIAGNOSIS — Z170 Estrogen receptor positive status [ER+]: Secondary | ICD-10-CM | POA: Insufficient documentation

## 2018-11-17 DIAGNOSIS — Z9012 Acquired absence of left breast and nipple: Secondary | ICD-10-CM

## 2018-11-17 DIAGNOSIS — Z5111 Encounter for antineoplastic chemotherapy: Secondary | ICD-10-CM | POA: Diagnosis not present

## 2018-11-17 DIAGNOSIS — C50412 Malignant neoplasm of upper-outer quadrant of left female breast: Secondary | ICD-10-CM

## 2018-11-17 LAB — ECHOCARDIOGRAM COMPLETE
Height: 66 in
Weight: 2208 oz

## 2018-11-17 NOTE — Progress Notes (Signed)
   Subjective:    Patient ID: [name], female    DOB: May 27, 1971, 48 y.o.   MRN: [id number]  The patient is a female here with her husband for follow-up on her left breast reconstruction.  She will be starting her chemo and getting it every other week.  After the chemo she will start her radiation.  We will try to get her exchange prior to radiation.  Incision is healing nicely.  She is expanding well.  There is no sign of seroma or hematoma.  There is no sign of infection   Review of Systems  Constitutional: Negative.   HENT: Negative.   Eyes: Negative.   Respiratory: Negative.   Cardiovascular: Negative.   Gastrointestinal: Negative.   Genitourinary: Negative.   Musculoskeletal: Negative.   Hematological: Negative.        Objective:   Physical Exam Vitals signs and nursing note reviewed.  Constitutional:      Appearance: Normal appearance.  HENT:     Head: Normocephalic.  Cardiovascular:     Rate and Rhythm: Normal rate.  Pulmonary:     Effort: Pulmonary effort is normal. No respiratory distress.  Neurological:     General: No focal deficit present.     Mental Status: She is alert.  Psychiatric:        Mood and Affect: Mood normal.        Behavior: Behavior normal.        Assessment & Plan:  Acquired absence of left breast  Malignant neoplasm of upper-outer quadrant of left breast in female, estrogen receptor positive (HCC)  We placed injectable saline in the Expander using a sterile technique: Left: 50 cc for a total of 100 / 300 cc Follow-up in 2 weeks.

## 2018-11-17 NOTE — Progress Notes (Signed)
Echocardiogram 2D Echocardiogram has been performed.  [name] 11/17/2018, 11:36 AM

## 2018-11-20 ENCOUNTER — Encounter: Payer: Self-pay | Admitting: General Practice

## 2018-11-20 NOTE — Progress Notes (Signed)
[place] Spiritual Care Note  Followed up with [name] by phone as planned. Per pt, this weekend she processed a lot of fear and anxiety related to chemo anticipation and is back to feeling steadier today. 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 We also discussed matters of faith and spiritual practices that might be helpful. We plan to speak again at the end of the week after her first tx.   [place], [place], [facility] Pager [phone] Voicemail [phone]

## 2018-11-21 ENCOUNTER — Encounter: Payer: Self-pay | Admitting: Oncology

## 2018-11-21 NOTE — Progress Notes (Signed)
Attempted to call pt to introduce myself as her Arboriculturist and to discuss copay assistance and the [employer].  I was unable to leave a msg because her mailbox was full.  I will try and meet her on 11/22/18.

## 2018-11-21 NOTE — Progress Notes (Signed)
[place]  [phone] [phone]) [phone]    ID: [name] DOB: [birth year]  MR#: [id number]  JQG#:[id number]  Patient Care Team: Orpah Melter, MD as PCP - General (Family Medicine) Mauro Kaufmann, RN as Oncology Nurse Navigator Rockwell Germany, RN as Oncology Nurse Navigator Jovita Kussmaul, MD as Consulting Physician (General Surgery) Kamaal Cast, Virgie Dad, MD as Consulting Physician (Oncology) Kyung Rudd, MD as Consulting Physician (Radiation Oncology) Aloha Gell, MD as Consulting Physician (Obstetrics and Gynecology) [name], Loel Lofty, DO as Attending Physician (Plastic Surgery) Chauncey Cruel, MD OTHER MD:   CHIEF COMPLAINT: Estrogen receptor positive breast cancer  CURRENT TREATMENT: Adjuvant chemotherapy   INTERVAL HISTORY: [name] was seen today for follow-up and treatment of her estrogen receptor positive breast cancer.   She will begin chemotherapy consisting of doxorubicin and cyclophosphamide in dose dense fashion x4 starting today, 11/22/2018.  Since her last visit, she underwent echocardiogram on 11/17/2018, showing an ejection fraction of 55-60%.  She continues to work with Dr. [name] on breast reconstruction, with her most recent follow up on 11/17/2018. She plans to get two fills during chemotherapy, and then a break.   REVIEW OF SYSTEMS: [name] reports doing well overall. She expresses nervousness about how chemotherapy will affect her. The patient denies unusual headaches, visual changes, nausea, vomiting, stiff neck, dizziness, or gait imbalance. There has been no cough, phlegm production, or pleurisy, no chest pain or pressure, and no change in bowel or bladder habits. The patient denies fever, rash, bleeding, unexplained fatigue or unexplained weight loss. A detailed review of systems was otherwise entirely negative.   HISTORY OF CURRENT ILLNESS: From the original intake note:  "[name]" presented  with left nipple retraction for 1 week with retroareolar mass in the upper outer quadrant. She underwent bilateral diagnostic mammography with CAD and left breast ultrasonography at [facility] on 09/25/2018 showing: 7 mm oval duct in the left breast; no significant abnormalities in the left axilla. She also underwent additional imaging with left breast digital diagnostic mammogram on 10/02/2018 showing: new 0.4 cm cluster of grouped heterogeneous calcifications in the left breast.   Accordingly on 10/02/2018 she proceeded to biopsy of the left breast area in question. 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 Prognostic indicators significant for: estrogen receptor, 90% positive, with moderate staining intensity and progesterone receptor, 100% positive, with strong staining intensity. Proliferation marker Ki67 at <1%. HER2 negative (1+).   The patient's subsequent history is as detailed above.   PAST MEDICAL HISTORY: Past Medical History:  Diagnosis Date  . Cancer ([place]) 10/18/2018   left breast cancer    PAST SURGICAL HISTORY: Past Surgical History:  Procedure Laterality Date  . BREAST RECONSTRUCTION WITH PLACEMENT OF TISSUE EXPANDER AND FLEX HD (ACELLULAR HYDRATED DERMIS) Left 10/18/2018   Procedure: IMMEDIATEVBREAST RECONSTRUCTION WITH PLACEMENT OF TISSUE EXPANDER AND FLEX HD (ACELLULAR HYDRATED DERMIS);  Surgeon: Wallace Going, DO;  Location: [place];  Service: Plastics;  Laterality: Left;  [name] Kitchen MASTECTOMY    . MASTECTOMY W/ SENTINEL NODE BIOPSY Left 10/18/2018   Procedure: LEFT MASTECTOMY WITH SENTINEL LYMPH NODE BIOPSY;  Surgeon: Jovita Kussmaul, MD;  Location: [place];  Service: General;  Laterality: Left;  .  PORTACATH PLACEMENT Right 11/02/2018  Procedure: INSERTION PORT-A-CATH WITH ULTRASOUND;  Surgeon: Jovita Kussmaul, MD;  Location: [place];  Service: General;  Laterality: Right;    FAMILY HISTORY Family History  Problem Relation Age of Onset  . Breast cancer Neg Hx   . Ovarian cancer Neg Hx    As of March 2020, patient's father is living and healthy at age 32. Patient's mother is also living and healthy at age 87. The patient denies a family hx of breast or ovarian cancer. She has 1 sister.  GYNECOLOGIC HISTORY:  No LMP recorded. (Menstrual status: IUD). Menarche: 48 years old Age at first live birth: 48 years old [place] Level 2 LMP in 2010 Contraceptive: Mirena IUD has been replaced by a ParaGard IUD as of March 2020 , reports she used birth control pills between ages 21 to 36 HRT n/a  Hysterectomy? no BSO? 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  She has a PhD in Probation officer.  Husband [name] is an Art gallery manager. She lives at home with her husband and 3 children. She has two children, [name] age 8 and [name] age 64. Husband [name] has one daughter, [name] age 30, who lives with them.    ADVANCED DIRECTIVES: In the absence of any documentation to the contrary her husband [name] is her HCPOA.   HEALTH MAINTENANCE: Social History   Tobacco Use  . Smoking status: Never Smoker  . Smokeless tobacco: Never Used  Substance Use Topics  . Alcohol use: Yes    Alcohol/week: 3.0 standard drinks    Types: 3 Standard drinks or equivalent per week    Comment: social  . Drug use: Never     Colonoscopy: never done  PAP: 08/2017  Bone density: never done   No Known Allergies  Current Outpatient Medications  Medication Sig Dispense Refill  . dexamethasone (DECADRON) 4 MG tablet Take 2 tablets by mouth once a day on the day after chemotherapy and then take 2 tablets two times a day for 2 days. Take with  food. 30 tablet 1  . diazepam (VALIUM) 2 MG tablet Take 1 tablet (2 mg total) by mouth every 6 (six) hours as needed for anxiety. 30 tablet 0  . HYDROcodone-acetaminophen (NORCO) 5-325 MG tablet Take 1-2 tablets by mouth every 6 (six) hours as needed for moderate pain. 10 tablet 0  . lidocaine-prilocaine (EMLA) cream Apply to affected area once 30 g 3  . loratadine (CLARITIN) 10 MG tablet Take 1 tablet (10 mg total) by mouth daily. 90 tablet 0  . LORazepam (ATIVAN) 0.5 MG tablet Take 1 tablet (0.5 mg total) by mouth at bedtime as needed (Nausea or vomiting). 30 tablet 0  . prochlorperazine (COMPAZINE) 10 MG tablet Take 1 tablet (10 mg total) by mouth every 6 (six) hours as needed (Nausea or vomiting). 30 tablet 1   No current facility-administered medications for this visit.     OBJECTIVE: [name] who appears well  Vitals:   11/22/18 1020  BP: (!) 104/52  Pulse: 89  Resp: 18  Temp: 98.3 F (36.8 C)  SpO2: 100%     Body mass index is 22.74 kg/m.   Wt Readings from Last 3 Encounters:  11/22/18 140 lb 14.4 oz (63.9 kg)  11/17/18 138 lb (62.6 kg)  11/17/18 138 lb 1.6 oz (62.6 kg)      ECOG FS:1 - Symptomatic but completely ambulatory  Sclerae unicteric, pupils round and equal  Wearing a mask No cervical or supraclavicular adenopathy Lungs no rales or rhonchi Heart regular rate and rhythm Abd soft, nontender, positive bowel sounds MSK no focal spinal tenderness, no upper extremity lymphedema Neuro: nonfocal, well oriented, appropriate affect Breasts: The right breast is unremarkable.  The left breast is status post mastectomy with expander in place.  The cosmetic result is good.  There is no dehiscence erythema or swelling.  Both axillae are benign.  LAB RESULTS:  CMP     Component Value Date/Time   NA 141 10/11/2018 0846   K 4.4 10/11/2018 0846   CL 107 10/11/2018 0846   CO2 24 10/11/2018 0846   GLUCOSE 86 10/11/2018 0846   BUN 14 10/11/2018 0846   CREATININE  0.87 10/11/2018 0846   CALCIUM 9.1 10/11/2018 0846   PROT 7.1 10/11/2018 0846   ALBUMIN 4.1 10/11/2018 0846   AST 16 10/11/2018 0846   ALT 18 10/11/2018 0846   ALKPHOS 40 10/11/2018 0846   BILITOT 0.6 10/11/2018 0846   GFRNONAA >60 10/11/2018 0846   GFRAA >60 10/11/2018 0846    No results found for: TOTALPROTELP, ALBUMINELP, A1GS, A2GS, BETS, BETA2SER, GAMS, MSPIKE, SPEI  No results found for: KPAFRELGTCHN, LAMBDASER, [facility]  Lab Results  Component Value Date   WBC 5.6 11/22/2018   NEUTROABS 3.3 11/22/2018   HGB 13.2 11/22/2018   HCT 41.2 11/22/2018   MCV 94.5 11/22/2018   PLT 242 11/22/2018    '@LASTCHEMISTRY' @  No results found for: LABCA2  No components found for: ZDGUYQ034  No results for input(s): INR in the last 168 hours.  No results found for: LABCA2  No results found for: VQQ595  No results found for: GLO756  No results found for: EPP295  No results found for: CA2729  No components found for: HGQUANT  No results found for: CEA1 / No results found for: CEA1   No results found for: AFPTUMOR  No results found for: CHROMOGRNA  No results found for: PSA1  Appointment on 11/22/2018  Component Date Value Ref Range Status  . WBC 11/22/2018 5.6  4.0 - 10.5 K/uL Final  . RBC 11/22/2018 4.36  3.87 - 5.11 MIL/uL Final  . Hemoglobin 11/22/2018 13.2  12.0 - 15.0 g/dL Final  . HCT 11/22/2018 41.2  36.0 - 46.0 % Final  . MCV 11/22/2018 94.5  80.0 - 100.0 fL Final  . MCH 11/22/2018 30.3  26.0 - 34.0 pg Final  . MCHC 11/22/2018 32.0  30.0 - 36.0 g/dL Final  . RDW 11/22/2018 12.7  11.5 - 15.5 % Final  . Platelets 11/22/2018 242  150 - 400 K/uL Final  . nRBC 11/22/2018 0.0  0.0 - 0.2 % Final  . Neutrophils Relative % 11/22/2018 58  % Final  . Neutro Abs 11/22/2018 3.3  1.7 - 7.7 K/uL Final  . Lymphocytes Relative 11/22/2018 30  % Final  . Lymphs Abs 11/22/2018 1.7  0.7 - 4.0 K/uL Final  . Monocytes Relative 11/22/2018 8  % Final  . Monocytes Absolute  11/22/2018 0.5  0.1 - 1.0 K/uL Final  . Eosinophils Relative 11/22/2018 3  % Final  . Eosinophils Absolute 11/22/2018 0.2  0.0 - 0.5 K/uL Final  . Basophils Relative 11/22/2018 1  % Final  . Basophils Absolute 11/22/2018 0.0  0.0 - 0.1 K/uL Final  . Immature Granulocytes 11/22/2018 0  % Final  . Abs Immature Granulocytes 11/22/2018 0.02  0.00 - 0.07 K/uL Final   Performed at [facility], [address] [place], [place] [address]    (this displays the last labs from the last 3 days)  No results found for: TOTALPROTELP, ALBUMINELP, A1GS, A2GS, BETS, BETA2SER, GAMS, MSPIKE, SPEI (this displays SPEP labs)  No results found for: KPAFRELGTCHN, LAMBDASER, KAPLAMBRATIO (kappa/lambda light chains)  No results found for: HGBA, HGBA2QUANT, HGBFQUANT, HGBSQUAN (Hemoglobinopathy evaluation)   No results found for: LDH  No results found for: IRON, TIBC, IRONPCTSAT (Iron and TIBC)  No results found for: FERRITIN  Urinalysis    Component Value Date/Time   COLORURINE YELLOW 01/27/2008 0235   APPEARANCEUR CLOUDY (A) 01/27/2008 0235   LABSPEC 1.027 01/27/2008 0235   PHURINE 7.0 01/27/2008 0235   GLUCOSEU NEGATIVE 01/27/2008 0235   HGBUR NEGATIVE 01/27/2008 0235   BILIRUBINUR NEGATIVE 01/27/2008 0235   KETONESUR NEGATIVE 01/27/2008 0235   PROTEINUR NEGATIVE 01/27/2008 0235   UROBILINOGEN 0.2 01/27/2008 0235   NITRITE NEGATIVE 01/27/2008 0235   LEUKOCYTESUR SMALL (A) 01/27/2008 0235     STUDIES: Dg Chest 1 View  Result Date: 11/02/2018 CLINICAL DATA:  Elective surgery. EXAM: CHEST  1 VIEW COMPARISON:  Radiographs of February 10, 2012. FINDINGS: The heart size and mediastinal contours are within normal limits. No pneumothorax or pleural effusion is noted. Right internal jugular Port-A-Cath is noted with distal tip in expected position of cavoatrial junction. Right lung is clear. Status post left breast surgery. Mild left basilar subsegmental atelectasis is noted. The  visualized skeletal structures are unremarkable. IMPRESSION: Status post left breast surgery. Mild left basilar subsegmental atelectasis. Interval placement of right internal jugular Port-A-Cath. Electronically Signed   By: Marijo Conception M.D.   On: 11/02/2018 09:49   Dg Fluoro Guide Cv Line-no Report  Result Date: 11/02/2018 Fluoroscopy was utilized by the requesting physician.  No radiographic interpretation.     ELIGIBLE FOR AVAILABLE RESEARCH PROTOCOL: no   ASSESSMENT: 48 y.o. [facility] Foster status post left breast upper outer quadrant biopsy 10/02/2018 for a clinical T2N0, stage IB invasive lobular breast cancer, grade not stated, estrogen and progesterone receptor positive, HER-2 not amplified, with an MIB-1 of less than 1%  (1) status post left mastectomy and sentinel lymph node sampling 10/18/2018 for a pT3 pN2, stage IIA invasive lobular breast cancer, grade 2, with close but negative margins  (a) 5 of 9 lymph nodes removed had micrometastatic deposits of tumor  (b) immediate expander placement  (2) chemotherapy will consist of doxorubicin and cyclophosphamide in dose dense fashion x4, starting 11/22/2018, to be followed by weekly paclitaxel x12  (3) adjuvant radiation to follow  (4) antiestrogens to start at the completion of local treatment  (a) consider goserelin/anastrozole   PLAN: [name] is ready to start her adjuvant chemotherapy today.  She has a good understanding of how to take her supportive medicines.  Prescriptions on hand.  We discussed that intimacy probably is best avoided for the first week.  On the other hand I am encouraging her to exercise as much as possible.  She is understandably anxious.  I have asked her to keep a diary of symptoms and again we reviewed the likely symptoms she may experience over the next.  She was scheduled to see me 11/28/2018 for nadir counts but that is a little bit early so I have moved that appointment to 12/01/2018.  She will  be having an expander injection a little later that day.  We reviewed her echocardiogram which is favorable  She knows to call for any other issues that may develop before  her next visit.   [name], Virgie Dad, MD  11/22/18 10:44 AM Medical Oncology and Hematology [facility] [address] [place], [place] [address] Tel. [phone]    Fax. [phone]    I, Wilburn Mylar, am acting as scribe for Dr. Virgie Dad. Elye Harmsen.

## 2018-11-22 ENCOUNTER — Inpatient Hospital Stay: Payer: 59

## 2018-11-22 ENCOUNTER — Encounter: Payer: Self-pay | Admitting: *Deleted

## 2018-11-22 ENCOUNTER — Encounter: Payer: Self-pay | Admitting: Oncology

## 2018-11-22 ENCOUNTER — Other Ambulatory Visit: Payer: Self-pay

## 2018-11-22 ENCOUNTER — Inpatient Hospital Stay (HOSPITAL_BASED_OUTPATIENT_CLINIC_OR_DEPARTMENT_OTHER): Payer: 59 | Admitting: Oncology

## 2018-11-22 VITALS — BP 104/52 | HR 89 | Temp 98.3°F | Resp 18 | Ht 66.0 in | Wt 140.9 lb

## 2018-11-22 DIAGNOSIS — C50412 Malignant neoplasm of upper-outer quadrant of left female breast: Secondary | ICD-10-CM

## 2018-11-22 DIAGNOSIS — Z170 Estrogen receptor positive status [ER+]: Secondary | ICD-10-CM

## 2018-11-22 DIAGNOSIS — Z5111 Encounter for antineoplastic chemotherapy: Secondary | ICD-10-CM | POA: Diagnosis not present

## 2018-11-22 DIAGNOSIS — Z95828 Presence of other vascular implants and grafts: Secondary | ICD-10-CM | POA: Insufficient documentation

## 2018-11-22 LAB — COMPREHENSIVE METABOLIC PANEL
ALT: 14 U/L (ref 0–44)
AST: 14 U/L — ABNORMAL LOW (ref 15–41)
Albumin: 3.6 g/dL (ref 3.5–5.0)
Alkaline Phosphatase: 45 U/L (ref 38–126)
Anion gap: 8 (ref 5–15)
BUN: 12 mg/dL (ref 6–20)
CO2: 24 mmol/L (ref 22–32)
Calcium: 8.6 mg/dL — ABNORMAL LOW (ref 8.9–10.3)
Chloride: 109 mmol/L (ref 98–111)
Creatinine, Ser: 0.8 mg/dL (ref 0.44–1.00)
GFR calc Af Amer: >60 mL/min (ref >60)
GFR calc non Af Amer: >60 mL/min (ref >60)
Glucose, Bld: 124 mg/dL — ABNORMAL HIGH (ref 70–99)
Potassium: 4.1 mmol/L (ref 3.5–5.1)
Sodium: 141 mmol/L (ref 135–145)
Total Bilirubin: 0.3 mg/dL (ref 0.3–1.2)
Total Protein: 6.4 g/dL — ABNORMAL LOW (ref 6.5–8.1)

## 2018-11-22 LAB — CBC WITH DIFFERENTIAL/PLATELET
Abs Immature Granulocytes: 0.02 10*3/uL (ref 0.00–0.07)
Basophils Absolute: 0 10*3/uL (ref 0.0–0.1)
Basophils Relative: 1 %
Eosinophils Absolute: 0.2 10*3/uL (ref 0.0–0.5)
Eosinophils Relative: 3 %
HCT: 41.2 % (ref 36.0–46.0)
Hemoglobin: 13.2 g/dL (ref 12.0–15.0)
Immature Granulocytes: 0 %
Lymphocytes Relative: 30 %
Lymphs Abs: 1.7 10*3/uL (ref 0.7–4.0)
MCH: 30.3 pg (ref 26.0–34.0)
MCHC: 32 g/dL (ref 30.0–36.0)
MCV: 94.5 fL (ref 80.0–100.0)
Monocytes Absolute: 0.5 10*3/uL (ref 0.1–1.0)
Monocytes Relative: 8 %
Neutro Abs: 3.3 10*3/uL (ref 1.7–7.7)
Neutrophils Relative %: 58 %
Platelets: 242 10*3/uL (ref 150–400)
RBC: 4.36 MIL/uL (ref 3.87–5.11)
RDW: 12.7 % (ref 11.5–15.5)
WBC: 5.6 10*3/uL (ref 4.0–10.5)
nRBC: 0 % (ref 0.0–0.2)

## 2018-11-22 MED ORDER — DOXORUBICIN HCL CHEMO IV INJECTION 2 MG/ML
60.0000 mg/m2 | Freq: Once | INTRAVENOUS | Status: AC
  Administered 2018-11-22: 104 mg via INTRAVENOUS
  Filled 2018-11-22: qty 52

## 2018-11-22 MED ORDER — PALONOSETRON HCL INJECTION 0.25 MG/5ML
0.2500 mg | Freq: Once | INTRAVENOUS | Status: AC
  Administered 2018-11-22: 11:00:00 0.25 mg via INTRAVENOUS

## 2018-11-22 MED ORDER — SODIUM CHLORIDE 0.9% FLUSH
10.0000 mL | Freq: Once | INTRAVENOUS | Status: AC
  Administered 2018-11-22: 10 mL
  Filled 2018-11-22: qty 10

## 2018-11-22 MED ORDER — SODIUM CHLORIDE 0.9 % IV SOLN
Freq: Once | INTRAVENOUS | Status: AC
  Administered 2018-11-22: 11:00:00 via INTRAVENOUS
  Filled 2018-11-22: qty 250

## 2018-11-22 MED ORDER — PEGFILGRASTIM 6 MG/0.6ML ~~LOC~~ PSKT
PREFILLED_SYRINGE | SUBCUTANEOUS | Status: AC
  Filled 2018-11-22: qty 0.6

## 2018-11-22 MED ORDER — PALONOSETRON HCL INJECTION 0.25 MG/5ML
INTRAVENOUS | Status: AC
  Filled 2018-11-22: qty 5

## 2018-11-22 MED ORDER — SODIUM CHLORIDE 0.9 % IV SOLN
600.0000 mg/m2 | Freq: Once | INTRAVENOUS | Status: AC
  Administered 2018-11-22: 13:00:00 1040 mg via INTRAVENOUS
  Filled 2018-11-22: qty 52

## 2018-11-22 MED ORDER — PEGFILGRASTIM 6 MG/0.6ML ~~LOC~~ PSKT
6.0000 mg | PREFILLED_SYRINGE | Freq: Once | SUBCUTANEOUS | Status: AC
  Administered 2018-11-22: 13:00:00 6 mg via SUBCUTANEOUS

## 2018-11-22 MED ORDER — SODIUM CHLORIDE 0.9% FLUSH
10.0000 mL | INTRAVENOUS | Status: DC | PRN
  Administered 2018-11-22: 14:00:00 10 mL
  Filled 2018-11-22: qty 10

## 2018-11-22 MED ORDER — SODIUM CHLORIDE 0.9 % IV SOLN
Freq: Once | INTRAVENOUS | Status: AC
  Administered 2018-11-22: 11:00:00 via INTRAVENOUS
  Filled 2018-11-22: qty 5

## 2018-11-22 MED ORDER — HEPARIN SOD (PORK) LOCK FLUSH 100 UNIT/ML IV SOLN
500.0000 [IU] | Freq: Once | INTRAVENOUS | Status: AC | PRN
  Administered 2018-11-22: 14:00:00 500 [IU]
  Filled 2018-11-22: qty 5

## 2018-11-22 NOTE — Patient Instructions (Signed)
[facility] Discharge Instructions for Patients Receiving Chemotherapy  Today you received the following chemotherapy agents Adriamycin, Cytoxan  To help prevent nausea and vomiting after your treatment, we encourage you to take your nausea medication as directed by MD   If you develop nausea and vomiting that is not controlled by your nausea medication, call the clinic.   BELOW ARE SYMPTOMS THAT SHOULD BE REPORTED IMMEDIATELY:  *FEVER GREATER THAN 100.5 F  *CHILLS WITH OR WITHOUT FEVER  NAUSEA AND VOMITING THAT IS NOT CONTROLLED WITH YOUR NAUSEA MEDICATION  *UNUSUAL SHORTNESS OF BREATH  *UNUSUAL BRUISING OR BLEEDING  TENDERNESS IN MOUTH AND THROAT WITH OR WITHOUT PRESENCE OF ULCERS  *URINARY PROBLEMS  *BOWEL PROBLEMS  UNUSUAL RASH Items with * indicate a potential emergency and should be followed up as soon as possible.  Feel free to call the clinic should you have any questions or concerns. The clinic phone number is [phone].  Please show the [place] at check-in to the Emergency Department and triage nurse.  Doxorubicin injection What is this medicine? DOXORUBICIN (dox oh ROO bi sin) is a chemotherapy drug. It is used to treat many kinds of cancer like leukemia, lymphoma, neuroblastoma, sarcoma, and Wilms' tumor. It is also used to treat bladder cancer, breast cancer, lung cancer, ovarian cancer, stomach cancer, and thyroid cancer. This medicine may be used for other purposes; ask your health care provider or pharmacist if you have questions. COMMON BRAND NAME(S): Adriamycin, Adriamycin PFS, Adriamycin RDF, Rubex What should I tell my health care provider before I take this medicine? They need to know if you have any of these conditions: -heart disease -history of low blood counts caused by a medicine -liver disease -recent or ongoing radiation therapy -an unusual or allergic reaction to doxorubicin, other chemotherapy agents, other  medicines, foods, dyes, or preservatives -pregnant or trying to get pregnant -breast-feeding How should I use this medicine? This drug is given as an infusion into a vein. It is administered in a hospital or clinic by a specially trained health care professional. If you have pain, swelling, burning or any unusual feeling around the site of your injection, tell your health care professional right away. Talk to your pediatrician regarding the use of this medicine in children. Special care may be needed. Overdosage: If you think you have taken too much of this medicine contact a poison control center or emergency room at once. NOTE: This medicine is only for you. Do not share this medicine with others. What if I miss a dose? It is important not to miss your dose. Call your doctor or health care professional if you are unable to keep an appointment. What may interact with this medicine? This medicine may interact with the following medications: -6-mercaptopurine -paclitaxel -phenytoin -St. John's Wort -trastuzumab -verapamil This list may not describe all possible interactions. Give your health care provider a list of all the medicines, herbs, non-prescription drugs, or dietary supplements you use. Also tell them if you smoke, drink alcohol, or use illegal drugs. Some items may interact with your medicine. What should I watch for while using this medicine? This drug may make you feel generally unwell. This is not uncommon, as chemotherapy can affect healthy cells as well as cancer cells. Report any side effects. Continue your course of treatment even though you feel ill unless your doctor tells you to stop. There is a maximum amount of this medicine you should receive throughout your life. The amount depends  on the medical condition being treated and your overall health. Your doctor will watch how much of this medicine you receive in your lifetime. Tell your doctor if you have taken this medicine  before. You may need blood work done while you are taking this medicine. Your urine may turn red for a few days after your dose. This is not blood. If your urine is dark or brown, call your doctor. In some cases, you may be given additional medicines to help with side effects. Follow all directions for their use. Call your doctor or health care professional for advice if you get a fever, chills or sore throat, or other symptoms of a cold or flu. Do not treat yourself. This drug decreases your body's ability to fight infections. Try to avoid being around people who are sick. This medicine may increase your risk to bruise or bleed. Call your doctor or health care professional if you notice any unusual bleeding. Talk to your doctor about your risk of cancer. You may be more at risk for certain types of cancers if you take this medicine. Do not become pregnant while taking this medicine or for 6 months after stopping it. Women should inform their doctor if they wish to become pregnant or think they might be pregnant. Men should not father a child while taking this medicine and for 6 months after stopping it. There is a potential for serious side effects to an unborn child. Talk to your health care professional or pharmacist for more information. Do not breast-feed an infant while taking this medicine. This medicine has caused ovarian failure in some women and reduced sperm counts in some men This medicine may interfere with the ability to have a child. Talk with your doctor or health care professional if you are concerned about your fertility. This medicine may cause a decrease in Co-Enzyme Q-10. You should make sure that you get enough Co-Enzyme Q-10 while you are taking this medicine. Discuss the foods you eat and the vitamins you take with your health care professional. What side effects may I notice from receiving this medicine? Side effects that you should report to your doctor or health care  professional as soon as possible: -allergic reactions like skin rash, itching or hives, swelling of the face, lips, or tongue -breathing problems -chest pain -fast or irregular heartbeat -low blood counts - this medicine may decrease the number of white blood cells, red blood cells and platelets. You may be at increased risk for infections and bleeding. -pain, redness, or irritation at site where injected -signs of infection - fever or chills, cough, sore throat, pain or difficulty passing urine -signs of decreased platelets or bleeding - bruising, pinpoint red spots on the skin, black, tarry stools, blood in the urine -swelling of the ankles, feet, hands -tiredness -weakness Side effects that usually do not require medical attention (report to your doctor or health care professional if they continue or are bothersome): -diarrhea -hair loss -mouth sores -nail discoloration or damage -nausea -red colored urine -vomiting This list may not describe all possible side effects. Call your doctor for medical advice about side effects. You may report side effects to FDA at 1-800-FDA-1088. Where should I keep my medicine? This drug is given in a hospital or clinic and will not be stored at home. NOTE: This sheet is a summary. It may not cover all possible information. If you have questions about this medicine, talk to your doctor, pharmacist, or health care provider.  2019 Elsevier/Gold Standard (2017-02-16 11:01:26)  Cyclophosphamide injection What is this medicine? CYCLOPHOSPHAMIDE (sye kloe FOSS fa mide) is a chemotherapy drug. It slows the growth of cancer cells. This medicine is used to treat many types of cancer like lymphoma, myeloma, leukemia, breast cancer, and ovarian cancer, to name a few. This medicine may be used for other purposes; ask your health care provider or pharmacist if you have questions. COMMON BRAND NAME(S): Cytoxan, Neosar What should I tell my health care provider  before I take this medicine? They need to know if you have any of these conditions: -blood disorders -history of other chemotherapy -infection -kidney disease -liver disease -recent or ongoing radiation therapy -tumors in the bone marrow -an unusual or allergic reaction to cyclophosphamide, other chemotherapy, other medicines, foods, dyes, or preservatives -pregnant or trying to get pregnant -breast-feeding How should I use this medicine? This drug is usually given as an injection into a vein or muscle or by infusion into a vein. It is administered in a hospital or clinic by a specially trained health care professional. Talk to your pediatrician regarding the use of this medicine in children. Special care may be needed. Overdosage: If you think you have taken too much of this medicine contact a poison control center or emergency room at once. NOTE: This medicine is only for you. Do not share this medicine with others. What if I miss a dose? It is important not to miss your dose. Call your doctor or health care professional if you are unable to keep an appointment. What may interact with this medicine? This medicine may interact with the following medications: -amiodarone -amphotericin B -azathioprine -certain antiviral medicines for HIV or AIDS such as protease inhibitors (e.g., indinavir, ritonavir) and zidovudine -certain blood pressure medications such as benazepril, captopril, enalapril, fosinopril, lisinopril, moexipril, monopril, perindopril, quinapril, ramipril, trandolapril -certain cancer medications such as anthracyclines (e.g., daunorubicin, doxorubicin), busulfan, cytarabine, paclitaxel, pentostatin, tamoxifen, trastuzumab -certain diuretics such as chlorothiazide, chlorthalidone, hydrochlorothiazide, indapamide, metolazone -certain medicines that treat or prevent blood clots like warfarin -certain muscle relaxants such as  succinylcholine -cyclosporine -etanercept -indomethacin -medicines to increase blood counts like filgrastim, pegfilgrastim, sargramostim -medicines used as general anesthesia -metronidazole -natalizumab This list may not describe all possible interactions. Give your health care provider a list of all the medicines, herbs, non-prescription drugs, or dietary supplements you use. Also tell them if you smoke, drink alcohol, or use illegal drugs. Some items may interact with your medicine. What should I watch for while using this medicine? Visit your doctor for checks on your progress. This drug may make you feel generally unwell. This is not uncommon, as chemotherapy can affect healthy cells as well as cancer cells. Report any side effects. Continue your course of treatment even though you feel ill unless your doctor tells you to stop. Drink water or other fluids as directed. Urinate often, even at night. In some cases, you may be given additional medicines to help with side effects. Follow all directions for their use. Call your doctor or health care professional for advice if you get a fever, chills or sore throat, or other symptoms of a cold or flu. Do not treat yourself. This drug decreases your body's ability to fight infections. Try to avoid being around people who are sick. This medicine may increase your risk to bruise or bleed. Call your doctor or health care professional if you notice any unusual bleeding. Be careful brushing and flossing your teeth or using a toothpick because you  may get an infection or bleed more easily. If you have any dental work done, tell your dentist you are receiving this medicine. You may get drowsy or dizzy. Do not drive, use machinery, or do anything that needs mental alertness until you know how this medicine affects you. Do not become pregnant while taking this medicine or for 1 year after stopping it. Women should inform their doctor if they wish to become  pregnant or think they might be pregnant. Men should not father a child while taking this medicine and for 4 months after stopping it. There is a potential for serious side effects to an unborn child. Talk to your health care professional or pharmacist for more information. Do not breast-feed an infant while taking this medicine. This medicine may interfere with the ability to have a child. This medicine has caused ovarian failure in some women. This medicine has caused reduced sperm counts in some men. You should talk with your doctor or health care professional if you are concerned about your fertility. If you are going to have surgery, tell your doctor or health care professional that you have taken this medicine. What side effects may I notice from receiving this medicine? Side effects that you should report to your doctor or health care professional as soon as possible: -allergic reactions like skin rash, itching or hives, swelling of the face, lips, or tongue -low blood counts - this medicine may decrease the number of white blood cells, red blood cells and platelets. You may be at increased risk for infections and bleeding. -signs of infection - fever or chills, cough, sore throat, pain or difficulty passing urine -signs of decreased platelets or bleeding - bruising, pinpoint red spots on the skin, black, tarry stools, blood in the urine -signs of decreased red blood cells - unusually weak or tired, fainting spells, lightheadedness -breathing problems -dark urine -dizziness -palpitations -swelling of the ankles, feet, hands -trouble passing urine or change in the amount of urine -weight gain -yellowing of the eyes or skin Side effects that usually do not require medical attention (report to your doctor or health care professional if they continue or are bothersome): -changes in nail or skin color -hair loss -missed menstrual periods -mouth sores -nausea, vomiting This list may not  describe all possible side effects. Call your doctor for medical advice about side effects. You may report side effects to FDA at 1-800-FDA-1088. Where should I keep my medicine? This drug is given in a hospital or clinic and will not be stored at home. NOTE: This sheet is a summary. It may not cover all possible information. If you have questions about this medicine, talk to your doctor, pharmacist, or health care provider.  2019 Elsevier/Gold Standard (2012-05-19 16:22:58)

## 2018-11-22 NOTE — Progress Notes (Signed)
Patient experienced nasal burning during Cytoxan. Consulted pharmacy and rate was reduced to 302 over 45 minutes. Patient stated burning resolved about 5 minutes after reduction. Message sent to provider and collab nurse.

## 2018-11-22 NOTE — Progress Notes (Signed)
Met w/ pt and introduced myself as her Arboriculturist and to discuss copay assistance.  Pt informed me she has met her deductible so her ins should pay for her treatment at 100% so copay assistance shouldn't be needed.  I informed her of the [employer] and went over what it covers.  She stated her and her husband are still working so they are in a pretty good state right now.  I gave her my card in case she changes her mind and for any questions or concerns she may have in the future.

## 2018-11-23 ENCOUNTER — Telehealth: Payer: Self-pay | Admitting: *Deleted

## 2018-11-23 ENCOUNTER — Encounter: Payer: Self-pay | Admitting: General Practice

## 2018-11-23 NOTE — Telephone Encounter (Signed)
-----   Message from Belva Chimes, RN sent at 11/22/2018  1:45 PM EDT ----- Regarding: [facility] First Time First time Adriamycin and Cytoxan. Patient had some sinus/nasal burning during cytoxan. Rate was reduced and she did well remainder of infusion. Onpro applied.

## 2018-11-23 NOTE — Progress Notes (Signed)
[facility] Note  Supporting [name] closely through emotional distress and faith questions as she adjusts to dx and begins tx. Mailing handwritten note of encouragement with [employer] online programming information to stay connected.   [place], [place], [facility] Pager [phone] Voicemail [phone]

## 2018-11-23 NOTE — Telephone Encounter (Signed)
[name] states she " feeling really good and any side effects are manageable"  [name] states she had mild nausea pm post therapy relieved with compazine.  She was tired but restless and not able to sleep - used lorazepam with good benefit ( slept 8 hours- woke feeling " refreshed ")  She currently has a mild headache " but I stopped caffeine yesterday ".  Pt states no further questions " everything was explained very well ".  This RN reiterated for pt to call if any concerns occur.

## 2018-11-24 ENCOUNTER — Encounter: Payer: Self-pay | Admitting: General Practice

## 2018-11-24 NOTE — Progress Notes (Signed)
[facility]ual Care Note  Followed up with [name] after first chemo as planned. She is both grateful that she's so far having fewer/less severe side effects than she feared and aware that her brain is on overdrive to assess whether new ones may be cropping up. Walks are helping calm her, and work is a helpful distraction. Her family is planning Mother's Day treats for her, which gives her something to look forward to in the window during which she may feel worst. Overall [name] appears to be coping well and maintaining a positive attitude, which is in turn keeping her morale up. Will continue to follow, but please also page if needs arise or circumstances change. Thank you.   [place], [place], [facility] Pager [phone] Voicemail [phone]

## 2018-11-28 ENCOUNTER — Other Ambulatory Visit: Payer: 59

## 2018-11-28 ENCOUNTER — Ambulatory Visit: Payer: 59 | Admitting: Oncology

## 2018-11-28 ENCOUNTER — Ambulatory Visit: Payer: 59 | Admitting: Adult Health

## 2018-11-30 ENCOUNTER — Telehealth: Payer: Self-pay | Admitting: Plastic Surgery

## 2018-11-30 NOTE — Telephone Encounter (Signed)
Called patient to confirm appointment scheduled for tomorrow. Patient answered the following questions: °1.Has the patient traveled outside of the state of [place] at all within the past 6 weeks? No °2.Does the patient have a fever or cough at all? No °3.Has the patient been tested for COVID? Had a positive COVID test? No °4. Has the patient been in contact with anyone who has tested positive? No ° °

## 2018-11-30 NOTE — Progress Notes (Signed)
[name]  [phone] [phone]    ID: [name] DOB: Apr 03, 1971  MR#: [id number]  OIT#:[id number]  Patient Care Team: Orpah Melter, MD as PCP - General (Family Medicine) Mauro Kaufmann, RN as Oncology Nurse Navigator Rockwell Germany, RN as Oncology Nurse Navigator Jovita Kussmaul, MD as Consulting Physician (General Surgery) Micalah Cabezas, Virgie Dad, MD as Consulting Physician (Oncology) Kyung Rudd, MD as Consulting Physician (Radiation Oncology) Aloha Gell, MD as Consulting Physician (Obstetrics and Gynecology) [name], Loel Lofty, DO as Attending Physician (Plastic Surgery) Chauncey Cruel, MD OTHER MD:   CHIEF COMPLAINT: Estrogen receptor positive breast cancer  CURRENT TREATMENT: Adjuvant chemotherapy   INTERVAL HISTORY: [name] was seen today for follow-up and treatment of her estrogen receptor positive breast cancer.   She continues on adjuvant chemotherapy consisting of doxorubicin and cyclophosphamide in dose dense fashion x4, to be followed by weekly paclitaxel x12. Today is day 10 cycle 1. She did have nausea, which she treated with compazine; she said that the symptoms were there, but well controlled. She had fatigue the first day, and then for days two through five she felt the need to take naps in the afternoon. She has had a mild headache for the last several days. She did have some myalgia, but it did not interfere with her daily routine. She notes some difficulty with gums, where her teeth felt gummy, but she did not have any mouth sores or thrush. She had some vision changes for the first two days, but that quickly resolved. By day 9, she feels well. Her taste remains unchanged.   Since her last visit here, she has not undergone any additional studies.     REVIEW OF SYSTEMS: Anaston's last menstrual period was the week before chemotherapy. She has plans to cut her hair once it starts falling out, and then  she has plans to wear a wig once it completely falls out. The patient denies unusual vomiting or dizziness. There has been no unusual cough, phlegm production, or pleurisy. This been no change in bowel or bladder habits. The patient denies unexplained weight loss, bleeding, rash, or fever. A detailed review of systems was otherwise noncontributory.    HISTORY OF CURRENT ILLNESS: From the original intake note:  "[name]" presented with left nipple retraction for 1 week with retroareolar mass in the upper outer quadrant. She underwent bilateral diagnostic mammography with CAD and left breast ultrason[facility] on 09/25/2018 showing: 7 mm oval duct in the left breast; no significant abnormalities in the left axilla. She also underwent additional imaging with left breast digital diagnostic mammogram on 10/02/2018 showing: new 0.4 cm cluster of grouped heterogeneous calcifications in the left breast.   Accordingly on 10/02/2018 she proceeded to biopsy of the left breast area in question. The pathology [id number]) from this procedure showed: mammary carcinoma in situ at the 3 o'clock mass, with possible focal microinvasion; invasive and in situ mammary carcinoma at the 12 o'clock mass, immunostain for E-cadherin is negative in the tumor cells, consistent with a lobular phenotype. Prognostic indicators significant for: estrogen receptor, 90% positive, with moderate staining intensity and progesterone receptor, 100% positive, with strong staining intensity. Proliferation marker Ki67 at <1%. HER2 negative (1+).   The patient's subsequent history is as detailed above.   PAST MEDICAL HISTORY: Past Medical History:  Diagnosis Date  . Cancer ([place]) 10/18/2018   left breast cancer    PAST SURGICAL HISTORY: Past Surgical History:  Procedure Laterality Date  .  BREAST RECONSTRUCTION WITH PLACEMENT OF TISSUE EXPANDER AND FLEX HD (ACELLULAR HYDRATED DERMIS) Left 10/18/2018   Procedure: IMMEDIATEVBREAST  RECONSTRUCTION WITH PLACEMENT OF TISSUE EXPANDER AND FLEX HD (ACELLULAR HYDRATED DERMIS);  Surgeon: Wallace Going, DO;  Location: [place];  Service: Plastics;  Laterality: Left;  Marland Kitchen MASTECTOMY    . MASTECTOMY W/ SENTINEL NODE BIOPSY Left 10/18/2018   Procedure: LEFT MASTECTOMY WITH SENTINEL LYMPH NODE BIOPSY;  Surgeon: Jovita Kussmaul, MD;  Location: [place];  Service: General;  Laterality: Left;  . PORTACATH PLACEMENT Right 11/02/2018   Procedure: INSERTION PORT-A-CATH WITH ULTRASOUND;  Surgeon: Jovita Kussmaul, MD;  Location: [place];  Service: General;  Laterality: Right;    FAMILY HISTORY Family History  Problem Relation Age of Onset  . Breast cancer Neg Hx   . Ovarian cancer Neg Hx    As of March 2020, patient's father is living and healthy at age 63. Patient's mother is also living and healthy at age 11. The patient denies a family hx of breast or ovarian cancer. She has 1 sister.  GYNECOLOGIC HISTORY:  No LMP recorded. (Menstrual status: IUD). Menarche: 48 years old Age at first live birth: 48 years old [place] 2 LMP in 2010 Contraceptive: Mirena IUD has been replaced by a ParaGard IUD as of March 2020 She used birth control pills between ages 31 to 48 w/o event HRT n/a  Hysterectomy? no BSO? 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  She has a PhD in Probation officer.  Husband [name] is an Art gallery manager. She lives at home with her husband and 3 children. She has two children, [name] age 72 and [name] age 27. Husband [name] has one daughter, [name] age 55, who lives with them.    ADVANCED DIRECTIVES: In the absence of any documentation to the contrary her husband [name] is her HCPOA.   HEALTH MAINTENANCE: Social History   Tobacco Use  . Smoking status: Never Smoker  . Smokeless tobacco: Never Used  Substance Use Topics  . Alcohol use: Yes     Alcohol/week: 3.0 standard drinks    Types: 3 Standard drinks or equivalent per week    Comment: social  . Drug use: Never     Colonoscopy: never done  PAP: 08/2017  Bone density: never done   No Known Allergies  Current Outpatient Medications  Medication Sig Dispense Refill  . dexamethasone (DECADRON) 4 MG tablet Take 2 tablets by mouth once a day on the day after chemotherapy and then take 2 tablets two times a day for 2 days. Take with food. 30 tablet 1  . diazepam (VALIUM) 2 MG tablet Take 1 tablet (2 mg total) by mouth every 6 (six) hours as needed for anxiety. 30 tablet 0  . HYDROcodone-acetaminophen (NORCO) 5-325 MG tablet Take 1-2 tablets by mouth every 6 (six) hours as needed for moderate pain. 10 tablet 0  . lidocaine-prilocaine (EMLA) cream Apply to affected area once 30 g 3  . loratadine (CLARITIN) 10 MG tablet Take 1 tablet (10 mg total) by mouth daily. 90 tablet 0  . LORazepam (ATIVAN) 0.5 MG tablet Take 1 tablet (0.5 mg total) by mouth at bedtime as needed (Nausea or vomiting). 30 tablet 0  . prochlorperazine (COMPAZINE) 10 MG tablet Take 1 tablet (10 mg total) by mouth every 6 (six) hours as needed (Nausea or vomiting). 30 tablet 1   No current  facility-administered medications for this visit.     OBJECTIVE: Young white woman in no acute distress Vitals:   12/01/18 0836  BP: (!) 106/59  Pulse: 75  Temp: 98.7 F (37.1 C)  SpO2: 100%     Body mass index is 22.45 kg/m.   Wt Readings from Last 3 Encounters:  12/01/18 139 lb 1.6 oz (63.1 kg)  11/22/18 140 lb 14.4 oz (63.9 kg)  11/17/18 138 lb (62.6 kg)      ECOG FS:1 - Symptomatic but completely ambulatory  Sclerae unicteric, EOMs intact Wearing a mask No cervical or supraclavicular adenopathy Lungs no rales or rhonchi Heart regular rate and rhythm Abd soft, nontender, positive bowel sounds MSK no focal spinal tenderness, no upper extremity lymphedema Neuro: nonfocal, well oriented, appropriate affect  Breasts: The right breast is unremarkable.  The left breast is status post mastectomy with expander in place.  The incision is healing well and there is a promise of good symmetry.  LAB RESULTS:  CMP     Component Value Date/Time   NA 139 12/01/2018 0808   K 4.4 12/01/2018 0808   CL 107 12/01/2018 0808   CO2 26 12/01/2018 0808   GLUCOSE 93 12/01/2018 0808   BUN 16 12/01/2018 0808   CREATININE 0.75 12/01/2018 0808   CALCIUM 8.7 (L) 12/01/2018 0808   PROT 6.2 (L) 12/01/2018 0808   ALBUMIN 3.5 12/01/2018 0808   AST 13 (L) 12/01/2018 0808   ALT 21 12/01/2018 0808   ALKPHOS 53 12/01/2018 0808   BILITOT <0.2 (L) 12/01/2018 0808   GFRNONAA >60 12/01/2018 0808   GFRAA >60 12/01/2018 0808    No results found for: TOTALPROTELP, ALBUMINELP, A1GS, A2GS, BETS, BETA2SER, GAMS, MSPIKE, SPEI  No results found for: KPAFRELGTCHN, LAMBDASER, KAPLAMBRATIO  Lab Results  Component Value Date   WBC 6.4 12/01/2018   NEUTROABS PENDING 12/01/2018   HGB 12.6 12/01/2018   HCT 38.9 12/01/2018   MCV 94.2 12/01/2018   PLT 144 (L) 12/01/2018    '@LASTCHEMISTRY' @  No results found for: LABCA2  No components found for: ZOXWRU045  No results for input(s): INR in the last 168 hours.  No results found for: LABCA2  No results found for: WUJ811  No results found for: BJY782  No results found for: NFA213  No results found for: CA2729  No components found for: HGQUANT  No results found for: CEA1 / No results found for: CEA1   No results found for: AFPTUMOR  No results found for: CHROMOGRNA  No results found for: PSA1  Appointment on 12/01/2018  Component Date Value Ref Range Status  . WBC 12/01/2018 6.4  4.0 - 10.5 K/uL Final  . RBC 12/01/2018 4.13  3.87 - 5.11 MIL/uL Final  . Hemoglobin 12/01/2018 12.6  12.0 - 15.0 g/dL Final  . HCT 12/01/2018 38.9  36.0 - 46.0 % Final  . MCV 12/01/2018 94.2  80.0 - 100.0 fL Final  . MCH 12/01/2018 30.5  26.0 - 34.0 pg Final  . MCHC 12/01/2018 32.4   30.0 - 36.0 g/dL Final  . RDW 12/01/2018 12.5  11.5 - 15.5 % Final  . Platelets 12/01/2018 144* 150 - 400 K/uL Final  . nRBC 12/01/2018 0.0  0.0 - 0.2 % Final   Performed at [facility], [place] [address], [place], [place] [address]  . Neutrophils Relative % 12/01/2018 PENDING  % Incomplete  . Neutro Abs 12/01/2018 PENDING  1.7 - 7.7 K/uL Incomplete  . Band Neutrophils 12/01/2018 PENDING  %  Incomplete  . Lymphocytes Relative 12/01/2018 PENDING  % Incomplete  . Lymphs Abs 12/01/2018 PENDING  0.7 - 4.0 K/uL Incomplete  . Monocytes Relative 12/01/2018 PENDING  % Incomplete  . Monocytes Absolute 12/01/2018 PENDING  0.1 - 1.0 K/uL Incomplete  . Eosinophils Relative 12/01/2018 PENDING  % Incomplete  . Eosinophils Absolute 12/01/2018 PENDING  0.0 - 0.5 K/uL Incomplete  . Basophils Relative 12/01/2018 PENDING  % Incomplete  . Basophils Absolute 12/01/2018 PENDING  0.0 - 0.1 K/uL Incomplete  . WBC Morphology 12/01/2018 PENDING   Incomplete  . RBC Morphology 12/01/2018 PENDING   Incomplete  . Smear Review 12/01/2018 PENDING   Incomplete  . Other 12/01/2018 PENDING  % Incomplete  . nRBC 12/01/2018 PENDING  0 /100 WBC Incomplete  . Metamyelocytes Relative 12/01/2018 PENDING  % Incomplete  . Myelocytes 12/01/2018 PENDING  % Incomplete  . Promyelocytes Relative 12/01/2018 PENDING  % Incomplete  . Blasts 12/01/2018 PENDING  % Incomplete  . Sodium 12/01/2018 139  135 - 145 mmol/L Final  . Potassium 12/01/2018 4.4  3.5 - 5.1 mmol/L Final  . Chloride 12/01/2018 107  98 - 111 mmol/L Final  . CO2 12/01/2018 26  22 - 32 mmol/L Final  . Glucose, Bld 12/01/2018 93  70 - 99 mg/dL Final  . BUN 12/01/2018 16  6 - 20 mg/dL Final  . Creatinine 12/01/2018 0.75  0.44 - 1.00 mg/dL Final  . Calcium 12/01/2018 8.7* 8.9 - 10.3 mg/dL Final  . Total Protein 12/01/2018 6.2* 6.5 - 8.1 g/dL Final  . Albumin 12/01/2018 3.5  3.5 - 5.0 g/dL Final  . AST 12/01/2018 13* 15 - 41 U/L Final  . ALT  12/01/2018 21  0 - 44 U/L Final  . Alkaline Phosphatase 12/01/2018 53  38 - 126 U/L Final  . Total Bilirubin 12/01/2018 <0.2* 0.3 - 1.2 mg/dL Final  . GFR, Est Non Af Am 12/01/2018 >60  >60 mL/min Final  . GFR, Est AFR Am 12/01/2018 >60  >60 mL/min Final  . Anion gap 12/01/2018 6  5 - 15 Final   Performed at [facility], [address] [place], [place] [address]    (this displays the last labs from the last 3 days)  No results found for: TOTALPROTELP, ALBUMINELP, A1GS, A2GS, BETS, BETA2SER, GAMS, MSPIKE, SPEI (this displays SPEP labs)  No results found for: KPAFRELGTCHN, LAMBDASER, KAPLAMBRATIO (kappa/lambda light chains)  No results found for: HGBA, HGBA2QUANT, HGBFQUANT, HGBSQUAN (Hemoglobinopathy evaluation)   No results found for: LDH  No results found for: IRON, TIBC, IRONPCTSAT (Iron and TIBC)  No results found for: FERRITIN  Urinalysis    Component Value Date/Time   COLORURINE YELLOW 01/27/2008 0235   APPEARANCEUR CLOUDY (A) 01/27/2008 0235   LABSPEC 1.027 01/27/2008 0235   PHURINE 7.0 01/27/2008 0235   GLUCOSEU NEGATIVE 01/27/2008 0235   HGBUR NEGATIVE 01/27/2008 0235   BILIRUBINUR NEGATIVE 01/27/2008 0235   KETONESUR NEGATIVE 01/27/2008 0235   PROTEINUR NEGATIVE 01/27/2008 0235   UROBILINOGEN 0.2 01/27/2008 0235   NITRITE NEGATIVE 01/27/2008 0235   LEUKOCYTESUR SMALL (A) 01/27/2008 0235     STUDIES: Dg Chest 1 View  Result Date: 11/02/2018 CLINICAL DATA:  Elective surgery. EXAM: CHEST  1 VIEW COMPARISON:  Radiographs of February 10, 2012. FINDINGS: The heart size and mediastinal contours are within normal limits. No pneumothorax or pleural effusion is noted. Right internal jugular Port-A-Cath is noted with distal tip in expected position of cavoatrial junction. Right lung is clear. Status post left  breast surgery. Mild left basilar subsegmental atelectasis is noted. The visualized skeletal structures are unremarkable. IMPRESSION: Status  post left breast surgery. Mild left basilar subsegmental atelectasis. Interval placement of right internal jugular Port-A-Cath. Electronically Signed   By: Marijo Conception M.D.   On: 11/02/2018 09:49   Dg Fluoro Guide Cv Line-no Report  Result Date: 11/02/2018 Fluoroscopy was utilized by the requesting physician.  No radiographic interpretation.     ELIGIBLE FOR AVAILABLE RESEARCH PROTOCOL: no   ASSESSMENT: 48 y.o. [facility] woman status post left breast upper outer quadrant biopsy 10/02/2018 for a clinical T2N0, stage IB invasive lobular breast cancer, grade not stated, estrogen and progesterone receptor positive, HER-2 not amplified, with an MIB-1 of less than 1%  (1) status post left mastectomy and sentinel lymph node sampling 10/18/2018 for a pT3 pN2, stage IIA invasive lobular breast cancer, grade 2, with close but negative margins  (a) 5 of 9 lymph nodes removed had micrometastatic deposits of tumor  (b) immediate expander placement  (2) chemotherapy will consist of doxorubicin and cyclophosphamide in dose dense fashion x4, starting 11/22/2018, to be followed by weekly paclitaxel x12  (3) adjuvant radiation to follow  (4) antiestrogens to start at the completion of local treatment  (a) consider goserelin/anastrozole   PLAN: Batsheva did very well with her first cycle of chemotherapy.  Her interval counts are excellent and she will not need prophylactic antibiotics.  She is having 3 symptoms which are I think all due to her premeds.  She had a mild headache, she had some constipation and she has some blurred vision.  The headache and constipation probably are due to the palonosetron.  She will take Tylenol as needed for the headache.  She will start stool softeners 2 tablets twice daily on day 1 until bowel movements normalize to take care of the constipation, which will occur with each cycle.  The blurred vision is due to accommodation issues from the steroids.  This is going to  persist.  We discussed ways to manage this.  Overall however she did remarkably well.  We will not need to see her in between treatments for the next 3 cycles.  Next treatment will be 12/05/2018.  She knows to call for any other issue that may develop before that visit.   Belem Hintze, Virgie Dad, MD  12/01/18 8:52 AM Medical Oncology and Hematology [facility] [address] [place], [place] [address] Tel. [phone]    Fax. [phone]   I, Jacqualyn Posey am acting as a Education administrator for Chauncey Cruel, MD.   I, Lurline Del MD, have reviewed the above documentation for accuracy and completeness, and I agree with the above.

## 2018-12-01 ENCOUNTER — Ambulatory Visit (INDEPENDENT_AMBULATORY_CARE_PROVIDER_SITE_OTHER): Payer: 59 | Admitting: Plastic Surgery

## 2018-12-01 ENCOUNTER — Inpatient Hospital Stay: Payer: 59

## 2018-12-01 ENCOUNTER — Inpatient Hospital Stay (HOSPITAL_BASED_OUTPATIENT_CLINIC_OR_DEPARTMENT_OTHER): Payer: 59 | Admitting: Oncology

## 2018-12-01 ENCOUNTER — Encounter: Payer: Self-pay | Admitting: Plastic Surgery

## 2018-12-01 ENCOUNTER — Other Ambulatory Visit: Payer: Self-pay

## 2018-12-01 VITALS — BP 106/59 | HR 75 | Temp 98.7°F | Ht 66.0 in | Wt 139.1 lb

## 2018-12-01 VITALS — BP 108/70 | HR 77 | Temp 98.3°F | Ht 66.0 in | Wt 141.0 lb

## 2018-12-01 DIAGNOSIS — K5900 Constipation, unspecified: Secondary | ICD-10-CM

## 2018-12-01 DIAGNOSIS — Z170 Estrogen receptor positive status [ER+]: Secondary | ICD-10-CM

## 2018-12-01 DIAGNOSIS — Z9012 Acquired absence of left breast and nipple: Secondary | ICD-10-CM

## 2018-12-01 DIAGNOSIS — C50412 Malignant neoplasm of upper-outer quadrant of left female breast: Secondary | ICD-10-CM

## 2018-12-01 DIAGNOSIS — Z5111 Encounter for antineoplastic chemotherapy: Secondary | ICD-10-CM | POA: Diagnosis not present

## 2018-12-01 DIAGNOSIS — R51 Headache: Secondary | ICD-10-CM

## 2018-12-01 DIAGNOSIS — H538 Other visual disturbances: Secondary | ICD-10-CM

## 2018-12-01 DIAGNOSIS — Z95828 Presence of other vascular implants and grafts: Secondary | ICD-10-CM

## 2018-12-01 LAB — CMP (CANCER CENTER ONLY)
ALT: 21 U/L (ref 0–44)
AST: 13 U/L — ABNORMAL LOW (ref 15–41)
Albumin: 3.5 g/dL (ref 3.5–5.0)
Alkaline Phosphatase: 53 U/L (ref 38–126)
Anion gap: 6 (ref 5–15)
BUN: 16 mg/dL (ref 6–20)
CO2: 26 mmol/L (ref 22–32)
Calcium: 8.7 mg/dL — ABNORMAL LOW (ref 8.9–10.3)
Chloride: 107 mmol/L (ref 98–111)
Creatinine: 0.75 mg/dL (ref 0.44–1.00)
GFR, Est AFR Am: >60 mL/min (ref >60)
GFR, Estimated: >60 mL/min (ref >60)
Glucose, Bld: 93 mg/dL (ref 70–99)
Potassium: 4.4 mmol/L (ref 3.5–5.1)
Sodium: 139 mmol/L (ref 135–145)
Total Bilirubin: <0.2 mg/dL — ABNORMAL LOW (ref 0.3–1.2)
Total Protein: 6.2 g/dL — ABNORMAL LOW (ref 6.5–8.1)

## 2018-12-01 LAB — CBC WITH DIFFERENTIAL/PLATELET
Abs Immature Granulocytes: 0.3 10*3/uL — ABNORMAL HIGH (ref 0.00–0.07)
Band Neutrophils: 8 %
Basophils Absolute: 0.1 10*3/uL (ref 0.0–0.1)
Basophils Relative: 2 %
Eosinophils Absolute: 0.4 10*3/uL (ref 0.0–0.5)
Eosinophils Relative: 6 %
HCT: 38.9 % (ref 36.0–46.0)
Hemoglobin: 12.6 g/dL (ref 12.0–15.0)
Lymphocytes Relative: 45 %
Lymphs Abs: 2.9 10*3/uL (ref 0.7–4.0)
MCH: 30.5 pg (ref 26.0–34.0)
MCHC: 32.4 g/dL (ref 30.0–36.0)
MCV: 94.2 fL (ref 80.0–100.0)
Metamyelocytes Relative: 3 %
Monocytes Absolute: 0.1 10*3/uL (ref 0.1–1.0)
Monocytes Relative: 2 %
Myelocytes: 1 %
Neutro Abs: 2.6 10*3/uL (ref 1.7–17.7)
Neutrophils Relative %: 33 %
Platelets: 144 10*3/uL — ABNORMAL LOW (ref 150–400)
RBC: 4.13 MIL/uL (ref 3.87–5.11)
RDW: 12.5 % (ref 11.5–15.5)
WBC: 6.4 10*3/uL (ref 4.0–10.5)
nRBC: 0 % (ref 0.0–0.2)

## 2018-12-01 MED ORDER — HEPARIN SOD (PORK) LOCK FLUSH 100 UNIT/ML IV SOLN
500.0000 [IU] | Freq: Once | INTRAVENOUS | Status: AC
  Administered 2018-12-01: 500 [IU]
  Filled 2018-12-01: qty 5

## 2018-12-01 MED ORDER — SODIUM CHLORIDE 0.9% FLUSH
10.0000 mL | Freq: Once | INTRAVENOUS | Status: AC
  Administered 2018-12-01: 10 mL
  Filled 2018-12-01: qty 10

## 2018-12-01 NOTE — Progress Notes (Signed)
   Subjective:    Patient ID: [name], female    DOB: 1970-11-08, 48 y.o.   MRN: [id number]  The patient is a 48 year old female here with her husband for follow-up on her left breast reconstruction.  She is tolerating expansion well.  Her skin is tight so we are moving slowly.  The incision is healing nicely there is no sign of seroma or infection. She has started her first round of chemo.  She is planning to finish chemo in September.    Review of Systems  Constitutional: Negative.  Negative for activity change and appetite change.  HENT: Negative.   Eyes: Negative.   Respiratory: Negative.  Negative for shortness of breath.   Cardiovascular: Negative.   Gastrointestinal: Negative.   Musculoskeletal: Negative.        Objective:   Physical Exam Vitals signs and nursing note reviewed.  Constitutional:      Appearance: Normal appearance.  HENT:     Head: Normocephalic and atraumatic.  Cardiovascular:     Rate and Rhythm: Normal rate.  Pulmonary:     Effort: Pulmonary effort is normal.  Neurological:     General: No focal deficit present.     Mental Status: She is alert.  Psychiatric:        Mood and Affect: Mood normal.        Assessment & Plan:  Acquired absence of left breast  Malignant neoplasm of upper-outer quadrant of left breast in female, estrogen receptor positive ([place])  We placed injectable saline in the Expander using a sterile technique: Left: 50 cc for a total of 150 / 300 cc  Follow up in 2-3 weeks.

## 2018-12-05 ENCOUNTER — Inpatient Hospital Stay: Payer: 59

## 2018-12-05 ENCOUNTER — Encounter: Payer: Self-pay | Admitting: Adult Health

## 2018-12-05 ENCOUNTER — Inpatient Hospital Stay (HOSPITAL_BASED_OUTPATIENT_CLINIC_OR_DEPARTMENT_OTHER): Payer: 59 | Admitting: Adult Health

## 2018-12-05 ENCOUNTER — Telehealth: Payer: Self-pay | Admitting: *Deleted

## 2018-12-05 ENCOUNTER — Other Ambulatory Visit: Payer: Self-pay

## 2018-12-05 VITALS — BP 112/46 | HR 74 | Temp 98.2°F | Resp 20 | Ht 66.0 in | Wt 139.7 lb

## 2018-12-05 DIAGNOSIS — C50412 Malignant neoplasm of upper-outer quadrant of left female breast: Secondary | ICD-10-CM

## 2018-12-05 DIAGNOSIS — Z5111 Encounter for antineoplastic chemotherapy: Secondary | ICD-10-CM | POA: Diagnosis not present

## 2018-12-05 DIAGNOSIS — Z170 Estrogen receptor positive status [ER+]: Secondary | ICD-10-CM

## 2018-12-05 DIAGNOSIS — Z95828 Presence of other vascular implants and grafts: Secondary | ICD-10-CM

## 2018-12-05 LAB — COMPREHENSIVE METABOLIC PANEL
ALT: 21 U/L (ref 0–44)
AST: 13 U/L — ABNORMAL LOW (ref 15–41)
Albumin: 3.6 g/dL (ref 3.5–5.0)
Alkaline Phosphatase: 50 U/L (ref 38–126)
Anion gap: 10 (ref 5–15)
BUN: 17 mg/dL (ref 6–20)
CO2: 23 mmol/L (ref 22–32)
Calcium: 8.7 mg/dL — ABNORMAL LOW (ref 8.9–10.3)
Chloride: 109 mmol/L (ref 98–111)
Creatinine, Ser: 0.88 mg/dL (ref 0.44–1.00)
GFR calc Af Amer: >60 mL/min (ref >60)
GFR calc non Af Amer: >60 mL/min (ref >60)
Glucose, Bld: 139 mg/dL — ABNORMAL HIGH (ref 70–99)
Potassium: 4 mmol/L (ref 3.5–5.1)
Sodium: 142 mmol/L (ref 135–145)
Total Bilirubin: <0.2 mg/dL — ABNORMAL LOW (ref 0.3–1.2)
Total Protein: 6.5 g/dL (ref 6.5–8.1)

## 2018-12-05 LAB — CBC WITH DIFFERENTIAL/PLATELET
Abs Immature Granulocytes: 0.53 10*3/uL — ABNORMAL HIGH (ref 0.00–0.07)
Basophils Absolute: 0.1 10*3/uL (ref 0.0–0.1)
Basophils Relative: 1 %
Eosinophils Absolute: 0 10*3/uL (ref 0.0–0.5)
Eosinophils Relative: 1 %
HCT: 38.9 % (ref 36.0–46.0)
Hemoglobin: 12.6 g/dL (ref 12.0–15.0)
Immature Granulocytes: 7 %
Lymphocytes Relative: 23 %
Lymphs Abs: 1.8 10*3/uL (ref 0.7–4.0)
MCH: 30.6 pg (ref 26.0–34.0)
MCHC: 32.4 g/dL (ref 30.0–36.0)
MCV: 94.4 fL (ref 80.0–100.0)
Monocytes Absolute: 0.6 10*3/uL (ref 0.1–1.0)
Monocytes Relative: 7 %
Neutro Abs: 4.9 10*3/uL (ref 1.7–7.7)
Neutrophils Relative %: 61 %
Platelets: 271 10*3/uL (ref 150–400)
RBC: 4.12 MIL/uL (ref 3.87–5.11)
RDW: 13.1 % (ref 11.5–15.5)
WBC: 7.9 10*3/uL (ref 4.0–10.5)
nRBC: 0 % (ref 0.0–0.2)

## 2018-12-05 MED ORDER — PEGFILGRASTIM 6 MG/0.6ML ~~LOC~~ PSKT
PREFILLED_SYRINGE | SUBCUTANEOUS | Status: AC
  Filled 2018-12-05: qty 0.6

## 2018-12-05 MED ORDER — PALONOSETRON HCL INJECTION 0.25 MG/5ML
0.2500 mg | Freq: Once | INTRAVENOUS | Status: AC
  Administered 2018-12-05: 15:00:00 0.25 mg via INTRAVENOUS

## 2018-12-05 MED ORDER — SODIUM CHLORIDE 0.9% FLUSH
10.0000 mL | Freq: Once | INTRAVENOUS | Status: AC
  Administered 2018-12-05: 10 mL
  Filled 2018-12-05: qty 10

## 2018-12-05 MED ORDER — SODIUM CHLORIDE 0.9 % IV SOLN
Freq: Once | INTRAVENOUS | Status: AC
  Administered 2018-12-05: 15:00:00 via INTRAVENOUS
  Filled 2018-12-05: qty 250

## 2018-12-05 MED ORDER — PEGFILGRASTIM 6 MG/0.6ML ~~LOC~~ PSKT
6.0000 mg | PREFILLED_SYRINGE | Freq: Once | SUBCUTANEOUS | Status: AC
  Administered 2018-12-05: 6 mg via SUBCUTANEOUS

## 2018-12-05 MED ORDER — PALONOSETRON HCL INJECTION 0.25 MG/5ML
INTRAVENOUS | Status: AC
  Filled 2018-12-05: qty 5

## 2018-12-05 MED ORDER — SODIUM CHLORIDE 0.9 % IV SOLN
Freq: Once | INTRAVENOUS | Status: AC
  Administered 2018-12-05: 15:00:00 via INTRAVENOUS
  Filled 2018-12-05: qty 5

## 2018-12-05 MED ORDER — HEPARIN SOD (PORK) LOCK FLUSH 100 UNIT/ML IV SOLN
500.0000 [IU] | Freq: Once | INTRAVENOUS | Status: AC | PRN
  Administered 2018-12-05: 18:00:00 500 [IU]
  Filled 2018-12-05: qty 5

## 2018-12-05 MED ORDER — DOXORUBICIN HCL CHEMO IV INJECTION 2 MG/ML
60.0000 mg/m2 | Freq: Once | INTRAVENOUS | Status: AC
  Administered 2018-12-05: 16:00:00 104 mg via INTRAVENOUS
  Filled 2018-12-05: qty 52

## 2018-12-05 MED ORDER — SODIUM CHLORIDE 0.9 % IV SOLN
600.0000 mg/m2 | Freq: Once | INTRAVENOUS | Status: AC
  Administered 2018-12-05: 1040 mg via INTRAVENOUS
  Filled 2018-12-05: qty 52

## 2018-12-05 MED ORDER — SODIUM CHLORIDE 0.9% FLUSH
10.0000 mL | INTRAVENOUS | Status: DC | PRN
  Administered 2018-12-05: 18:00:00 10 mL
  Filled 2018-12-05: qty 10

## 2018-12-05 NOTE — Telephone Encounter (Signed)
Left vm to assess needs during chemo. Contact information provided for questions or needs.

## 2018-12-05 NOTE — Progress Notes (Signed)
[place]  [phone] [phone]    ID: [name] DOB: 12/19/1970  MR#: [id number]  YSA#:[id number]  Patient Care Team: Orpah Melter, MD as PCP - General (Family Medicine) Mauro Kaufmann, RN as Oncology Nurse Navigator Rockwell Germany, RN as Oncology Nurse Navigator Jovita Kussmaul, MD as Consulting Physician (General Surgery) Magrinat, Virgie Dad, MD as Consulting Physician (Oncology) Kyung Rudd, MD as Consulting Physician (Radiation Oncology) Aloha Gell, MD as Consulting Physician (Obstetrics and Gynecology) Dillingham, Loel Lofty, DO as Attending Physician (Plastic Surgery) Scot Dock, NP OTHER MD:   CHIEF COMPLAINT: Estrogen receptor positive breast cancer  CURRENT TREATMENT: Adjuvant chemotherapy   INTERVAL HISTORY: [name] was seen today for follow-up and treatment of her estrogen receptor positive breast cancer.   She continues on adjuvant chemotherapy consisting of doxorubicin and cyclophosphamide in dose dense fashion x4, to be followed by weekly paclitaxel x12. Today is day 1 cycle 2 of doxorubicin and cyclophosphamide.    REVIEW OF SYSTEMS: [name] is doing well today.  Her constipation has improved.  She is tolerating her treatment well.  She has no issues.  She denies fever, chills, chest pain, palpitations, cough, shortness of breath, nausea, vomiting, bowel/bladder changes.  A detailed ROS was non contributory today.     HISTORY OF CURRENT ILLNESS: From the original intake note:  "[name]" presented with left nipple retraction for 1 week with retroareolar mass in the upper outer quadrant. She underwent bilateral diagnostic mammography with CAD and left breast ultrasonography at [facility] on 09/25/2018 showing: 7 mm oval duct in the left breast; no significant abnormalities in the left axilla. She also underwent additional imaging with left breast digital diagnostic mammogram on 10/02/2018 showing: new 0.4 cm  cluster of grouped heterogeneous calcifications in the left breast.   Accordingly on 10/02/2018 she proceeded to biopsy of the left breast area in question. 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 Prognostic indicators significant for: estrogen receptor, 90% positive, with moderate staining intensity and progesterone receptor, 100% positive, with strong staining intensity. Proliferation marker Ki67 at <1%. HER2 negative (1+).   The patient's subsequent history is as detailed above.   PAST MEDICAL HISTORY: Past Medical History:  Diagnosis Date  . Cancer ([place]) 10/18/2018   left breast cancer    PAST SURGICAL HISTORY: Past Surgical History:  Procedure Laterality Date  . BREAST RECONSTRUCTION WITH PLACEMENT OF TISSUE EXPANDER AND FLEX HD (ACELLULAR HYDRATED DERMIS) Left 10/18/2018   Procedure: IMMEDIATEVBREAST RECONSTRUCTION WITH PLACEMENT OF TISSUE EXPANDER AND FLEX HD (ACELLULAR HYDRATED DERMIS);  Surgeon: Wallace Going, DO;  Location: [facility];  Service: Plastics;  Laterality: Left;  Marland Kitchen MASTECTOMY    . MASTECTOMY W/ SENTINEL NODE BIOPSY Left 10/18/2018   Procedure: LEFT MASTECTOMY WITH SENTINEL LYMPH NODE BIOPSY;  Surgeon: Jovita Kussmaul, MD;  Location: [facility];  Service: General;  Laterality: Left;  . PORTACATH PLACEMENT Right 11/02/2018   Procedure: INSERTION PORT-A-CATH WITH ULTRASOUND;  Surgeon: Jovita Kussmaul, MD;  Location: [facility];  Service: General;  Laterality: Right;    FAMILY HISTORY Family History  Problem Relation Age of Onset  . Breast cancer Neg Hx   . Ovarian cancer Neg Hx    As of March 2020,  patient's father is living and healthy at age 68. Patient's mother is  also living and healthy at age 65. The patient denies a family hx of breast or ovarian cancer. She has 1 sister.  GYNECOLOGIC HISTORY:  No LMP recorded. (Menstrual status: IUD). Menarche: 48 years old Age at first live birth: 48 years old Gap 2 LMP in 2010 Contraceptive: Mirena IUD has been replaced by a ParaGard IUD as of March 2020 She used birth control pills between ages 25 to 20 w/o event HRT n/a  Hysterectomy? no BSO? 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  She has a PhD in Probation officer.  Husband [name] is an Art gallery manager. She lives at home with her husband and 3 children. She has two children, [name] age 37 and [name] age 30. Husband [name] has one daughter, [name] age 27, who lives with them.    ADVANCED DIRECTIVES: In the absence of any documentation to the contrary her husband [name] is her HCPOA.   HEALTH MAINTENANCE: Social History   Tobacco Use  . Smoking status: Never Smoker  . Smokeless tobacco: Never Used  Substance Use Topics  . Alcohol use: Yes    Alcohol/week: 3.0 standard drinks    Types: 3 Standard drinks or equivalent per week    Comment: social  . Drug use: Never     Colonoscopy: never done  PAP: 08/2017  Bone density: never done   No Known Allergies  Current Outpatient Medications  Medication Sig Dispense Refill  . dexamethasone (DECADRON) 4 MG tablet Take 2 tablets by mouth once a day on the day after chemotherapy and then take 2 tablets two times a day for 2 days. Take with food. 30 tablet 1  . diazepam (VALIUM) 2 MG tablet Take 1 tablet (2 mg total) by mouth every 6 (six) hours as needed for anxiety. 30 tablet 0  . HYDROcodone-acetaminophen (NORCO) 5-325 MG tablet Take 1-2 tablets by mouth every 6 (six) hours as needed for moderate pain. 10 tablet 0  . lidocaine-prilocaine (EMLA) cream Apply to affected area once 30 g 3  . loratadine (CLARITIN) 10 MG tablet Take 1 tablet  (10 mg total) by mouth daily. 90 tablet 0  . LORazepam (ATIVAN) 0.5 MG tablet Take 1 tablet (0.5 mg total) by mouth at bedtime as needed (Nausea or vomiting). 30 tablet 0  . prochlorperazine (COMPAZINE) 10 MG tablet Take 1 tablet (10 mg total) by mouth every 6 (six) hours as needed (Nausea or vomiting). 30 tablet 1   No current facility-administered medications for this visit.     OBJECTIVE: Young white woman in no acute distress Vitals:   12/05/18 1324  BP: (!) 112/46  Pulse: 74  Resp: 20  Temp: 98.2 F (36.8 C)  SpO2: 100%     Body mass index is 22.55 kg/m.   Wt Readings from Last 3 Encounters:  12/05/18 139 lb 11.2 oz (63.4 kg)  12/01/18 141 lb (64 kg)  12/01/18 139 lb 1.6 oz (63.1 kg)      ECOG FS:1 - Symptomatic but completely ambulatory GENERAL: Patient is a well appearing female in no acute distress HEENT:  Sclerae anicteric.  Oropharynx clear and moist. No ulcerations or evidence of oropharyngeal candidiasis. Neck is supple.  NODES:  No cervical, supraclavicular, or axillary lymphadenopathy palpated.  BREAST EXAM:  Deferred. LUNGS:  Clear to auscultation bilaterally.  No wheezes or rhonchi. HEART:  Regular rate and rhythm.  No murmur appreciated. ABDOMEN:  Soft, nontender.  Positive, normoactive bowel sounds. No organomegaly palpated. MSK:  No focal spinal tenderness to palpation. Full range of motion bilaterally in the upper extremities. EXTREMITIES:  No peripheral edema.   SKIN:  Clear with no obvious rashes or skin changes. No nail dyscrasia. NEURO:  Nonfocal. Well oriented.  Appropriate affect.    LAB RESULTS:  CMP     Component Value Date/Time   NA 142 12/05/2018 1305   K 4.0 12/05/2018 1305   CL 109 12/05/2018 1305   CO2 23 12/05/2018 1305   GLUCOSE 139 (H) 12/05/2018 1305   BUN 17 12/05/2018 1305   CREATININE 0.88 12/05/2018 1305   CREATININE 0.75 12/01/2018 0808   CALCIUM 8.7 (L) 12/05/2018 1305   PROT 6.5 12/05/2018 1305   ALBUMIN 3.6 12/05/2018  1305   AST 13 (L) 12/05/2018 1305   AST 13 (L) 12/01/2018 0808   ALT 21 12/05/2018 1305   ALT 21 12/01/2018 0808   ALKPHOS 50 12/05/2018 1305   BILITOT <0.2 (L) 12/05/2018 1305   BILITOT <0.2 (L) 12/01/2018 0808   GFRNONAA >60 12/05/2018 1305   GFRNONAA >60 12/01/2018 0808   GFRAA >60 12/05/2018 1305   GFRAA >60 12/01/2018 0808    No results found for: TOTALPROTELP, ALBUMINELP, A1GS, A2GS, BETS, BETA2SER, GAMS, MSPIKE, SPEI  No results found for: KPAFRELGTCHN, LAMBDASER, KAPLAMBRATIO  Lab Results  Component Value Date   WBC 7.9 12/05/2018   NEUTROABS 4.9 12/05/2018   HGB 12.6 12/05/2018   HCT 38.9 12/05/2018   MCV 94.4 12/05/2018   PLT 271 12/05/2018    _0 @  No results found for: LABCA2  No components found for: HYWVPX106  No results for input(s): INR in the last 168 hours.  No results found for: LABCA2  No results found for: YIR485  No results found for: IOE703  No results found for: JKK938  No results found for: CA2729  No components found for: HGQUANT  No results found for: CEA1 / No results found for: CEA1   No results found for: AFPTUMOR  No results found for: CHROMOGRNA  No results found for: PSA1  Appointment on 12/05/2018  Component Date Value Ref Range Status  . Sodium 12/05/2018 142  135 - 145 mmol/L Final  . Potassium 12/05/2018 4.0  3.5 - 5.1 mmol/L Final  . Chloride 12/05/2018 109  98 - 111 mmol/L Final  . CO2 12/05/2018 23  22 - 32 mmol/L Final  . Glucose, Bld 12/05/2018 139* 70 - 99 mg/dL Final  . BUN 12/05/2018 17  6 - 20 mg/dL Final  . Creatinine, Ser 12/05/2018 0.88  0.44 - 1.00 mg/dL Final  . Calcium 12/05/2018 8.7* 8.9 - 10.3 mg/dL Final  . Total Protein 12/05/2018 6.5  6.5 - 8.1 g/dL Final  . Albumin 12/05/2018 3.6  3.5 - 5.0 g/dL Final  . AST 12/05/2018 13* 15 - 41 U/L Final  . ALT 12/05/2018 21  0 - 44 U/L Final  . Alkaline Phosphatase 12/05/2018 50  38 - 126 U/L Final  . Total Bilirubin 12/05/2018 <0.2* 0.3 -  1.2 mg/dL Final  . GFR calc non Af Amer 12/05/2018 >60  >60 mL/min Final  . GFR calc Af Amer 12/05/2018 >60  >60 mL/min Final  . Anion gap 12/05/2018 10  5 - 15 Final   Performed at [facility], [place] [address] [place], [place] [address]  . WBC 12/05/2018 7.9  4.0 - 10.5 K/uL Final  .  RBC 12/05/2018 4.12  3.87 - 5.11 MIL/uL Final  . Hemoglobin 12/05/2018 12.6  12.0 - 15.0 g/dL Final  . HCT 12/05/2018 38.9  36.0 - 46.0 % Final  . MCV 12/05/2018 94.4  80.0 - 100.0 fL Final  . MCH 12/05/2018 30.6  26.0 - 34.0 pg Final  . MCHC 12/05/2018 32.4  30.0 - 36.0 g/dL Final  . RDW 12/05/2018 13.1  11.5 - 15.5 % Final  . Platelets 12/05/2018 271  150 - 400 K/uL Final  . nRBC 12/05/2018 0.0  0.0 - 0.2 % Final  . Neutrophils Relative % 12/05/2018 61  % Final  . Neutro Abs 12/05/2018 4.9  1.7 - 7.7 K/uL Final  . Lymphocytes Relative 12/05/2018 23  % Final  . Lymphs Abs 12/05/2018 1.8  0.7 - 4.0 K/uL Final  . Monocytes Relative 12/05/2018 7  % Final  . Monocytes Absolute 12/05/2018 0.6  0.1 - 1.0 K/uL Final  . Eosinophils Relative 12/05/2018 1  % Final  . Eosinophils Absolute 12/05/2018 0.0  0.0 - 0.5 K/uL Final  . Basophils Relative 12/05/2018 1  % Final  . Basophils Absolute 12/05/2018 0.1  0.0 - 0.1 K/uL Final  . Immature Granulocytes 12/05/2018 7  % Final   Increased IG's, likely caused by Bone Marrow Colony Stimulating Factor received within 30 days.  . Abs Immature Granulocytes 12/05/2018 0.53* 0.00 - 0.07 K/uL Final   Performed at [facility], [address] [place], [place] [address]    (this displays the last labs from the last 3 days)  No results found for: TOTALPROTELP, ALBUMINELP, A1GS, A2GS, BETS, BETA2SER, GAMS, MSPIKE, SPEI (this displays SPEP labs)  No results found for: KPAFRELGTCHN, LAMBDASER, KAPLAMBRATIO (kappa/lambda light chains)  No results found for: HGBA, HGBA2QUANT, HGBFQUANT, HGBSQUAN (Hemoglobinopathy evaluation)    No results found for: LDH  No results found for: IRON, TIBC, IRONPCTSAT (Iron and TIBC)  No results found for: FERRITIN  Urinalysis    Component Value Date/Time   COLORURINE YELLOW 01/27/2008 0235   APPEARANCEUR CLOUDY (A) 01/27/2008 0235   LABSPEC 1.027 01/27/2008 0235   PHURINE 7.0 01/27/2008 0235   GLUCOSEU NEGATIVE 01/27/2008 0235   HGBUR NEGATIVE 01/27/2008 0235   BILIRUBINUR NEGATIVE 01/27/2008 0235   KETONESUR NEGATIVE 01/27/2008 0235   PROTEINUR NEGATIVE 01/27/2008 0235   UROBILINOGEN 0.2 01/27/2008 0235   NITRITE NEGATIVE 01/27/2008 0235   LEUKOCYTESUR SMALL (A) 01/27/2008 0235     STUDIES: No results found.   ELIGIBLE FOR AVAILABLE RESEARCH PROTOCOL: no   ASSESSMENT: 48 y.o. [facility] woman status post left breast upper outer quadrant biopsy 10/02/2018 for a clinical T2N0, stage IB invasive lobular breast cancer, grade not stated, estrogen and progesterone receptor positive, HER-2 not amplified, with an MIB-1 of less than 1%  (1) status post left mastectomy and sentinel lymph node sampling 10/18/2018 for a pT3 pN2, stage IIA invasive lobular breast cancer, grade 2, with close but negative margins  (a) 5 of 9 lymph nodes removed had micrometastatic deposits of tumor  (b) immediate expander placement  (2) chemotherapy will consist of doxorubicin and cyclophosphamide in dose dense fashion x4, starting 11/22/2018, to be followed by weekly paclitaxel x12  (3) adjuvant radiation to follow  (4) antiestrogens to start at the completion of local treatment  (a) consider goserelin/anastrozole   PLAN: [name] is doing well today. Her labs are stable.  She is feeling well.  She will proceed with her second cycle of adjuvant Doxorubicin and Cyclophosphamide.  She  tolerated it well.  She understands that she needs to take colace bid on day 1 until bowel movements are normalized.  Her constipation since her last visit has resolved.    Per Dr. Virgie Dad last note,  since she did remarkably well with treatment, we will see her on treatment day for labs and evaluation, and will not see her in between treatments.  This has been scheduled.  She will return in 2 weeks for labs, f/u and cycle 3 of her adjuvant chemotherapy.  She knows to call for any other issue that may develop before that visit.  A total of (20) minutes of face-to-face time was spent with this patient with greater than 50% of that time in counseling and care-coordination.  Wilber Bihari, NP  12/05/18 1:50 PM Medical Oncology and Hematology [facility] [address] [place], [place] [address] Tel. [phone]    Fax. [phone]

## 2018-12-05 NOTE — Patient Instructions (Signed)
[facility] Discharge Instructions for Patients Receiving Chemotherapy  Today you received the following chemotherapy agents Adriamycin, Cytoxan  To help prevent nausea and vomiting after your treatment, we encourage you to take your nausea medication as directed by MD   If you develop nausea and vomiting that is not controlled by your nausea medication, call the clinic.   BELOW ARE SYMPTOMS THAT SHOULD BE REPORTED IMMEDIATELY:  *FEVER GREATER THAN 100.5 F  *CHILLS WITH OR WITHOUT FEVER  NAUSEA AND VOMITING THAT IS NOT CONTROLLED WITH YOUR NAUSEA MEDICATION  *UNUSUAL SHORTNESS OF BREATH  *UNUSUAL BRUISING OR BLEEDING  TENDERNESS IN MOUTH AND THROAT WITH OR WITHOUT PRESENCE OF ULCERS  *URINARY PROBLEMS  *BOWEL PROBLEMS  UNUSUAL RASH Items with * indicate a potential emergency and should be followed up as soon as possible.  Feel free to call the clinic should you have any questions or concerns. The clinic phone number is [phone].  Please show the [facility] at check-in to the Emergency Department and triage nurse.  Doxorubicin injection What is this medicine? DOXORUBICIN (dox oh ROO bi sin) is a chemotherapy drug. It is used to treat many kinds of cancer like leukemia, lymphoma, neuroblastoma, sarcoma, and Wilms' tumor. It is also used to treat bladder cancer, breast cancer, lung cancer, ovarian cancer, stomach cancer, and thyroid cancer. This medicine may be used for other purposes; ask your health care provider or pharmacist if you have questions. COMMON BRAND NAME(S): Adriamycin, Adriamycin PFS, Adriamycin RDF, Rubex What should I tell my health care provider before I take this medicine? They need to know if you have any of these conditions: -heart disease -history of low blood counts caused by a medicine -liver disease -recent or ongoing radiation therapy -an unusual or allergic reaction to doxorubicin, other chemotherapy agents, other  medicines, foods, dyes, or preservatives -pregnant or trying to get pregnant -breast-feeding How should I use this medicine? This drug is given as an infusion into a vein. It is administered in a hospital or clinic by a specially trained health care professional. If you have pain, swelling, burning or any unusual feeling around the site of your injection, tell your health care professional right away. Talk to your pediatrician regarding the use of this medicine in children. Special care may be needed. Overdosage: If you think you have taken too much of this medicine contact a poison control center or emergency room at once. NOTE: This medicine is only for you. Do not share this medicine with others. What if I miss a dose? It is important not to miss your dose. Call your doctor or health care professional if you are unable to keep an appointment. What may interact with this medicine? This medicine may interact with the following medications: -6-mercaptopurine -paclitaxel -phenytoin -St. John's Wort -trastuzumab -verapamil This list may not describe all possible interactions. Give your health care provider a list of all the medicines, herbs, non-prescription drugs, or dietary supplements you use. Also tell them if you smoke, drink alcohol, or use illegal drugs. Some items may interact with your medicine. What should I watch for while using this medicine? This drug may make you feel generally unwell. This is not uncommon, as chemotherapy can affect healthy cells as well as cancer cells. Report any side effects. Continue your course of treatment even though you feel ill unless your doctor tells you to stop. There is a maximum amount of this medicine you should receive throughout your life. The amount depends  on the medical condition being treated and your overall health. Your doctor will watch how much of this medicine you receive in your lifetime. Tell your doctor if you have taken this medicine  before. You may need blood work done while you are taking this medicine. Your urine may turn red for a few days after your dose. This is not blood. If your urine is dark or brown, call your doctor. In some cases, you may be given additional medicines to help with side effects. Follow all directions for their use. Call your doctor or health care professional for advice if you get a fever, chills or sore throat, or other symptoms of a cold or flu. Do not treat yourself. This drug decreases your body's ability to fight infections. Try to avoid being around people who are sick. This medicine may increase your risk to bruise or bleed. Call your doctor or health care professional if you notice any unusual bleeding. Talk to your doctor about your risk of cancer. You may be more at risk for certain types of cancers if you take this medicine. Do not become pregnant while taking this medicine or for 6 months after stopping it. Women should inform their doctor if they wish to become pregnant or think they might be pregnant. Men should not father a child while taking this medicine and for 6 months after stopping it. There is a potential for serious side effects to an unborn child. Talk to your health care professional or pharmacist for more information. Do not breast-feed an infant while taking this medicine. This medicine has caused ovarian failure in some women and reduced sperm counts in some men This medicine may interfere with the ability to have a child. Talk with your doctor or health care professional if you are concerned about your fertility. This medicine may cause a decrease in Co-Enzyme Q-10. You should make sure that you get enough Co-Enzyme Q-10 while you are taking this medicine. Discuss the foods you eat and the vitamins you take with your health care professional. What side effects may I notice from receiving this medicine? Side effects that you should report to your doctor or health care  professional as soon as possible: -allergic reactions like skin rash, itching or hives, swelling of the face, lips, or tongue -breathing problems -chest pain -fast or irregular heartbeat -low blood counts - this medicine may decrease the number of white blood cells, red blood cells and platelets. You may be at increased risk for infections and bleeding. -pain, redness, or irritation at site where injected -signs of infection - fever or chills, cough, sore throat, pain or difficulty passing urine -signs of decreased platelets or bleeding - bruising, pinpoint red spots on the skin, black, tarry stools, blood in the urine -swelling of the ankles, feet, hands -tiredness -weakness Side effects that usually do not require medical attention (report to your doctor or health care professional if they continue or are bothersome): -diarrhea -hair loss -mouth sores -nail discoloration or damage -nausea -red colored urine -vomiting This list may not describe all possible side effects. Call your doctor for medical advice about side effects. You may report side effects to FDA at 1-800-FDA-1088. Where should I keep my medicine? This drug is given in a hospital or clinic and will not be stored at home. NOTE: This sheet is a summary. It may not cover all possible information. If you have questions about this medicine, talk to your doctor, pharmacist, or health care provider.  2019 [employer]/Gold Standard (2017-02-16 11:01:26)  Cyclophosphamide injection What is this medicine? CYCLOPHOSPHAMIDE (sye kloe FOSS fa mide) is a chemotherapy drug. It slows the growth of cancer cells. This medicine is used to treat many types of cancer like lymphoma, myeloma, leukemia, breast cancer, and ovarian cancer, to name a few. This medicine may be used for other purposes; ask your health care provider or pharmacist if you have questions. COMMON BRAND NAME(S): Cytoxan, Neosar What should I tell my health care provider  before I take this medicine? They need to know if you have any of these conditions: -blood disorders -history of other chemotherapy -infection -kidney disease -liver disease -recent or ongoing radiation therapy -tumors in the bone marrow -an unusual or allergic reaction to cyclophosphamide, other chemotherapy, other medicines, foods, dyes, or preservatives -pregnant or trying to get pregnant -breast-feeding How should I use this medicine? This drug is usually given as an injection into a vein or muscle or by infusion into a vein. It is administered in a hospital or clinic by a specially trained health care professional. Talk to your pediatrician regarding the use of this medicine in children. Special care may be needed. Overdosage: If you think you have taken too much of this medicine contact a poison control center or emergency room at once. NOTE: This medicine is only for you. Do not share this medicine with others. What if I miss a dose? It is important not to miss your dose. Call your doctor or health care professional if you are unable to keep an appointment. What may interact with this medicine? This medicine may interact with the following medications: -amiodarone -amphotericin B -azathioprine -certain antiviral medicines for HIV or AIDS such as protease inhibitors (e.g., indinavir, ritonavir) and zidovudine -certain blood pressure medications such as benazepril, captopril, enalapril, fosinopril, lisinopril, moexipril, monopril, perindopril, quinapril, ramipril, trandolapril -certain cancer medications such as anthracyclines (e.g., daunorubicin, doxorubicin), busulfan, cytarabine, paclitaxel, pentostatin, tamoxifen, trastuzumab -certain diuretics such as chlorothiazide, chlorthalidone, hydrochlorothiazide, indapamide, metolazone -certain medicines that treat or prevent blood clots like warfarin -certain muscle relaxants such as  succinylcholine -cyclosporine -etanercept -indomethacin -medicines to increase blood counts like filgrastim, pegfilgrastim, sargramostim -medicines used as general anesthesia -metronidazole -natalizumab This list may not describe all possible interactions. Give your health care provider a list of all the medicines, herbs, non-prescription drugs, or dietary supplements you use. Also tell them if you smoke, drink alcohol, or use illegal drugs. Some items may interact with your medicine. What should I watch for while using this medicine? Visit your doctor for checks on your progress. This drug may make you feel generally unwell. This is not uncommon, as chemotherapy can affect healthy cells as well as cancer cells. Report any side effects. Continue your course of treatment even though you feel ill unless your doctor tells you to stop. Drink water or other fluids as directed. Urinate often, even at night. In some cases, you may be given additional medicines to help with side effects. Follow all directions for their use. Call your doctor or health care professional for advice if you get a fever, chills or sore throat, or other symptoms of a cold or flu. Do not treat yourself. This drug decreases your body's ability to fight infections. Try to avoid being around people who are sick. This medicine may increase your risk to bruise or bleed. Call your doctor or health care professional if you notice any unusual bleeding. Be careful brushing and flossing your teeth or using a toothpick because you  may get an infection or bleed more easily. If you have any dental work done, tell your dentist you are receiving this medicine. You may get drowsy or dizzy. Do not drive, use machinery, or do anything that needs mental alertness until you know how this medicine affects you. Do not become pregnant while taking this medicine or for 1 year after stopping it. Women should inform their doctor if they wish to become  pregnant or think they might be pregnant. Men should not father a child while taking this medicine and for 4 months after stopping it. There is a potential for serious side effects to an unborn child. Talk to your health care professional or pharmacist for more information. Do not breast-feed an infant while taking this medicine. This medicine may interfere with the ability to have a child. This medicine has caused ovarian failure in some women. This medicine has caused reduced sperm counts in some men. You should talk with your doctor or health care professional if you are concerned about your fertility. If you are going to have surgery, tell your doctor or health care professional that you have taken this medicine. What side effects may I notice from receiving this medicine? Side effects that you should report to your doctor or health care professional as soon as possible: -allergic reactions like skin rash, itching or hives, swelling of the face, lips, or tongue -low blood counts - this medicine may decrease the number of white blood cells, red blood cells and platelets. You may be at increased risk for infections and bleeding. -signs of infection - fever or chills, cough, sore throat, pain or difficulty passing urine -signs of decreased platelets or bleeding - bruising, pinpoint red spots on the skin, black, tarry stools, blood in the urine -signs of decreased red blood cells - unusually weak or tired, fainting spells, lightheadedness -breathing problems -dark urine -dizziness -palpitations -swelling of the ankles, feet, hands -trouble passing urine or change in the amount of urine -weight gain -yellowing of the eyes or skin Side effects that usually do not require medical attention (report to your doctor or health care professional if they continue or are bothersome): -changes in nail or skin color -hair loss -missed menstrual periods -mouth sores -nausea, vomiting This list may not  describe all possible side effects. Call your doctor for medical advice about side effects. You may report side effects to FDA at 1-800-FDA-1088. Where should I keep my medicine? This drug is given in a hospital or clinic and will not be stored at home. NOTE: This sheet is a summary. It may not cover all possible information. If you have questions about this medicine, talk to your doctor, pharmacist, or health care provider.  2019 [employer]/Gold Standard (2012-05-19 16:22:58)

## 2018-12-18 NOTE — Progress Notes (Signed)
[place]  Telephone:([phone] Fax:(336) [phone]    ID: [name] DOB: 06-04-1971  MR#: [id number]  XFG#:[id number]  Patient Care Team: Orpah Melter, MD as PCP - General (Family Medicine) Mauro Kaufmann, RN as Oncology Nurse Navigator Rockwell Germany, RN as Oncology Nurse Navigator Jovita Kussmaul, MD as Consulting Physician (General Surgery) Ellina Sivertsen, Virgie Dad, MD as Consulting Physician (Oncology) Kyung Rudd, MD as Consulting Physician (Radiation Oncology) Aloha Gell, MD as Consulting Physician (Obstetrics and Gynecology) [name], Loel Lofty, DO as Attending Physician (Plastic Surgery) Chauncey Cruel, MD OTHER MD:   CHIEF COMPLAINT: Estrogen receptor positive breast cancer  CURRENT TREATMENT: Adjuvant chemotherapy   INTERVAL HISTORY: [name] was seen today for follow-up and treatment of her estrogen receptor positive breast cancer.   She continues on adjuvant chemotherapy consisting of doxorubicin and cyclophosphamide in dose dense fashion x4, to be followed by weekly paclitaxel x12. Today is day 1 cycle 3 of doxorubicin and cyclophosphamide. She reports unchanged energy levels, a single mouth sore.  She last saw Dr. [name] on 12/01/2018 for continued reconstruction care.   REVIEW OF SYSTEMS: [name] reports doing well overall. She lost her hair since her last visit. Her daughter cut it, and then her husband shaved it. She states she walks around her neighborhood for exercise. 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  The patient denies unusual headaches, visual changes, nausea, vomiting, stiff neck, dizziness, or gait imbalance. There has been no cough, phlegm production, or pleurisy, no chest pain or pressure, and no change in bowel or bladder habits. The patient denies fever, rash, bleeding, unexplained fatigue or unexplained weight loss. A detailed review of systems was otherwise entirely negative.    HISTORY OF CURRENT ILLNESS: From the original intake note:  "[name]" presented with left nipple retraction for 1 week with retroareolar mass in the upper outer quadrant. She underwent bilateral diagnostic mammography with CAD and left breast ultrasonography at [facility] on 09/25/2018 showing: 7 mm oval duct in the left breast; no significant abnormalities in the left axilla. She also underwent additional imaging with left breast digital diagnostic mammogram on 10/02/2018 showing: new 0.4 cm cluster of grouped heterogeneous calcifications in the left breast.   Accordingly on 10/02/2018 she proceeded to biopsy of the left breast area in question. 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 Prognostic indicators significant for: estrogen receptor, 90% positive, with moderate staining intensity and progesterone receptor, 100% positive, with strong staining intensity. Proliferation marker Ki67 at <1%. HER2 negative (1+).   The patient's subsequent history is as detailed above.   PAST MEDICAL HISTORY: Past Medical History:  Diagnosis Date  . Cancer ([place]) 10/18/2018   left breast cancer    PAST SURGICAL HISTORY: Past Surgical History:  Procedure Laterality Date  . BREAST RECONSTRUCTION WITH PLACEMENT OF TISSUE EXPANDER AND FLEX HD (ACELLULAR HYDRATED DERMIS) Left 10/18/2018   Procedure: IMMEDIATEVBREAST RECONSTRUCTION WITH PLACEMENT OF TISSUE EXPANDER AND FLEX HD (ACELLULAR HYDRATED DERMIS);  Surgeon: Wallace Going, DO;  Location: [place];  Service: Plastics;  Laterality: Left;  Marland Kitchen MASTECTOMY    . MASTECTOMY W/ SENTINEL NODE BIOPSY Left 10/18/2018   Procedure: LEFT MASTECTOMY WITH SENTINEL LYMPH NODE BIOPSY;  Surgeon: Jovita Kussmaul,  MD;  Location: [facility];  Service: General;  Laterality: Left;  . PORTACATH PLACEMENT Right 11/02/2018   Procedure: INSERTION PORT-A-CATH WITH ULTRASOUND;  Surgeon: Jovita Kussmaul, MD;  Location: [facility];  Service: General;  Laterality: Right;    FAMILY HISTORY Family History  Problem Relation Age of Onset  . Breast cancer Neg Hx   . Ovarian cancer Neg Hx    As of March 2020, patient's father is living and healthy at age 22. Patient's mother is also living and healthy at age 37. The patient denies a family hx of breast or ovarian cancer. She has 1 sister.  GYNECOLOGIC HISTORY:  No LMP recorded. (Menstrual status: IUD). Menarche: 48 years old Age at first live birth: 48 years old [place] 2 LMP in 2010 Contraceptive: Mirena IUD has been replaced by a ParaGard IUD as of March 2020 She used birth control pills between ages 80 to 67 w/o event HRT n/a  Hysterectomy? no BSO? 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  She has a PhD in Probation officer.  Husband [name] is an Art gallery manager. She lives at home with her husband and 3 children. She has two children, [name] age 63 and [name] age 8. Husband [name] has one daughter, [name] age 70, who lives with them.    ADVANCED DIRECTIVES: In the absence of any documentation to the contrary her husband [name] is her HCPOA.   HEALTH MAINTENANCE: Social History   Tobacco Use  . Smoking status: Never Smoker  . Smokeless tobacco: Never Used  Substance Use Topics  . Alcohol use: Yes    Alcohol/week: 3.0 standard drinks    Types: 3 Standard drinks or equivalent per week    Comment: social  . Drug use: Never     Colonoscopy: never done  PAP: 08/2017  Bone density: never done   No Known Allergies  Current Outpatient Medications  Medication Sig Dispense Refill  . dexamethasone (DECADRON) 4 MG tablet Take 2 tablets by mouth once a day on the  day after chemotherapy and then take 2 tablets two times a day for 2 days. Take with food. 30 tablet 1  . diazepam (VALIUM) 2 MG tablet Take 1 tablet (2 mg total) by mouth every 6 (six) hours as needed for anxiety. 30 tablet 0  . HYDROcodone-acetaminophen (NORCO) 5-325 MG tablet Take 1-2 tablets by mouth every 6 (six) hours as needed for moderate pain. 10 tablet 0  . lidocaine-prilocaine (EMLA) cream Apply to affected area once 30 g 3  . loratadine (CLARITIN) 10 MG tablet Take 1 tablet (10 mg total) by mouth daily. 90 tablet 0  . LORazepam (ATIVAN) 0.5 MG tablet Take 1 tablet (0.5 mg total) by mouth at bedtime as needed (Nausea or vomiting). 30 tablet 0  . prochlorperazine (COMPAZINE) 10 MG tablet Take 1 tablet (10 mg total) by mouth every 6 (six) hours as needed (Nausea or vomiting). 30 tablet 1  . valACYclovir (VALTREX) 1000 MG tablet Take 1 tablet (1,000 mg total) by mouth daily. 10 tablet 0   No current facility-administered medications for this visit.     OBJECTIVE: Young white woman who appears well Vitals:   12/19/18 1238  BP: 106/60  Pulse: 72  Resp: 18  Temp: 99.1 F (37.3 C)  SpO2: 100%     Body mass index is 22.63 kg/m.   Wt Readings from Last 3 Encounters:  12/19/18 140 lb 3.2  oz (63.6 kg)  12/05/18 139 lb 11.2 oz (63.4 kg)  12/01/18 141 lb (64 kg)      ECOG FS:1 - Symptomatic but completely ambulatory  Sclerae unicteric, pupils round and equal Wearing a mask No cervical or supraclavicular adenopathy Lungs no rales or rhonchi Heart regular rate and rhythm Abd soft, nontender, positive bowel sounds MSK no focal spinal tenderness, no upper extremity lymphedema Neuro: nonfocal, well oriented, appropriate affect Breasts: Deferred    LAB RESULTS:  CMP     Component Value Date/Time   NA 142 12/05/2018 1305   K 4.0 12/05/2018 1305   CL 109 12/05/2018 1305   CO2 23 12/05/2018 1305   GLUCOSE 139 (H) 12/05/2018 1305   BUN 17 12/05/2018 1305   CREATININE 0.88  12/05/2018 1305   CREATININE 0.75 12/01/2018 0808   CALCIUM 8.7 (L) 12/05/2018 1305   PROT 6.5 12/05/2018 1305   ALBUMIN 3.6 12/05/2018 1305   AST 13 (L) 12/05/2018 1305   AST 13 (L) 12/01/2018 0808   ALT 21 12/05/2018 1305   ALT 21 12/01/2018 0808   ALKPHOS 50 12/05/2018 1305   BILITOT <0.2 (L) 12/05/2018 1305   BILITOT <0.2 (L) 12/01/2018 0808   GFRNONAA >60 12/05/2018 1305   GFRNONAA >60 12/01/2018 0808   GFRAA >60 12/05/2018 1305   GFRAA >60 12/01/2018 0808    No results found for: TOTALPROTELP, ALBUMINELP, A1GS, A2GS, BETS, BETA2SER, GAMS, MSPIKE, SPEI  No results found for: KPAFRELGTCHN, LAMBDASER, [facility]  Lab Results  Component Value Date   WBC 7.8 12/19/2018   NEUTROABS PENDING 12/19/2018   HGB 11.5 (L) 12/19/2018   HCT 35.2 (L) 12/19/2018   MCV 94.9 12/19/2018   PLT 260 12/19/2018    '@LASTCHEMISTRY' @  No results found for: LABCA2  No components found for: CVELFY101  No results for input(s): INR in the last 168 hours.  No results found for: LABCA2  No results found for: BPZ025  No results found for: ENI778  No results found for: EUM353  No results found for: CA2729  No components found for: HGQUANT  No results found for: CEA1 / No results found for: CEA1   No results found for: AFPTUMOR  No results found for: CHROMOGRNA  No results found for: PSA1  Appointment on 12/19/2018  Component Date Value Ref Range Status  . WBC 12/19/2018 7.8  4.0 - 10.5 K/uL Final  . RBC 12/19/2018 3.71* 3.87 - 5.11 MIL/uL Final  . Hemoglobin 12/19/2018 11.5* 12.0 - 15.0 g/dL Final  . HCT 12/19/2018 35.2* 36.0 - 46.0 % Final  . MCV 12/19/2018 94.9  80.0 - 100.0 fL Final  . MCH 12/19/2018 31.0  26.0 - 34.0 pg Final  . MCHC 12/19/2018 32.7  30.0 - 36.0 g/dL Final  . RDW 12/19/2018 13.3  11.5 - 15.5 % Final  . Platelets 12/19/2018 260  150 - 400 K/uL Final  . nRBC 12/19/2018 0.0  0.0 - 0.2 % Final   Performed at [facility], [place]  [address], [place], [place] [address]  . Neutrophils Relative % 12/19/2018 PENDING  % Incomplete  . Neutro Abs 12/19/2018 PENDING  1.7 - 7.7 K/uL Incomplete  . Band Neutrophils 12/19/2018 PENDING  % Incomplete  . Lymphocytes Relative 12/19/2018 PENDING  % Incomplete  . Lymphs Abs 12/19/2018 PENDING  0.7 - 4.0 K/uL Incomplete  . Monocytes Relative 12/19/2018 PENDING  % Incomplete  . Monocytes Absolute 12/19/2018 PENDING  0.1 - 1.0 K/uL Incomplete  . Eosinophils Relative 12/19/2018  PENDING  % Incomplete  . Eosinophils Absolute 12/19/2018 PENDING  0.0 - 0.5 K/uL Incomplete  . Basophils Relative 12/19/2018 PENDING  % Incomplete  . Basophils Absolute 12/19/2018 PENDING  0.0 - 0.1 K/uL Incomplete  . WBC Morphology 12/19/2018 PENDING   Incomplete  . RBC Morphology 12/19/2018 PENDING   Incomplete  . Smear Review 12/19/2018 PENDING   Incomplete  . Other 12/19/2018 PENDING  % Incomplete  . nRBC 12/19/2018 PENDING  0 /100 WBC Incomplete  . Metamyelocytes Relative 12/19/2018 PENDING  % Incomplete  . Myelocytes 12/19/2018 PENDING  % Incomplete  . Promyelocytes Relative 12/19/2018 PENDING  % Incomplete  . Blasts 12/19/2018 PENDING  % Incomplete    (this displays the last labs from the last 3 days)  No results found for: TOTALPROTELP, ALBUMINELP, A1GS, A2GS, BETS, BETA2SER, GAMS, MSPIKE, SPEI (this displays SPEP labs)  No results found for: KPAFRELGTCHN, LAMBDASER, KAPLAMBRATIO (kappa/lambda light chains)  No results found for: HGBA, HGBA2QUANT, HGBFQUANT, HGBSQUAN (Hemoglobinopathy evaluation)   No results found for: LDH  No results found for: IRON, TIBC, IRONPCTSAT (Iron and TIBC)  No results found for: FERRITIN  Urinalysis    Component Value Date/Time   COLORURINE YELLOW 01/27/2008 0235   APPEARANCEUR CLOUDY (A) 01/27/2008 0235   LABSPEC 1.027 01/27/2008 0235   PHURINE 7.0 01/27/2008 0235   GLUCOSEU NEGATIVE 01/27/2008 0235   HGBUR NEGATIVE 01/27/2008 0235   BILIRUBINUR NEGATIVE  01/27/2008 0235   KETONESUR NEGATIVE 01/27/2008 0235   PROTEINUR NEGATIVE 01/27/2008 0235   UROBILINOGEN 0.2 01/27/2008 0235   NITRITE NEGATIVE 01/27/2008 0235   LEUKOCYTESUR SMALL (A) 01/27/2008 0235     STUDIES: No results found.   ELIGIBLE FOR AVAILABLE RESEARCH PROTOCOL: no   ASSESSMENT: 48 y.o. [facility] woman status post left breast upper outer quadrant biopsy 10/02/2018 for a clinical T2N0, stage IB invasive lobular breast cancer, grade not stated, estrogen and progesterone receptor positive, HER-2 not amplified, with an MIB-1 of less than 1%  (1) status post left mastectomy and sentinel lymph node sampling 10/18/2018 for a pT3 pN2, stage IIA invasive lobular breast cancer, grade 2, with close but negative margins  (a) 5 of 9 lymph nodes removed had micrometastatic deposits of tumor  (b) immediate expander placement  (2) chemotherapy will consist of doxorubicin and cyclophosphamide in dose dense fashion x4, starting 11/22/2018, to be followed by weekly paclitaxel x12  (3) adjuvant radiation to follow  (4) antiestrogens to start at the completion of local treatment  (a) consider goserelin/anastrozole   PLAN: Misa is tolerating her chemotherapy remarkably well and she will proceed to third of 4 cycles of AC today.  She did develop a mild sore this time.  This is likely to recur.  I am putting her on Valtrex daily for the next 5 days.  She will take Valtrex again beginning day 1 of cycle 4 again for 5 days.  I believe that will take care of the problem.  She of course is upset about losing her hair but she has talked herself into accepting it and generally is doing a good job at working through the many physical changes that she is undergoing  She will start her paclitaxel on June 29 and will receive that weekly for 12 weeks.  I believe her final reconstruction will not be completed until she is done with chemo  She knows to call for any other issue that may develop  before the next visit.  Virgie Dad. Keyonta Madrid, MD  12/19/18 1:00 PM Medical  Oncology and Hematology [facility] [address] [place], [place] [address] Tel. [phone]    Fax. [phone]    I, Wilburn Mylar, am acting as scribe for Dr. Virgie Dad. Jetaime Pinnix.  I, Lurline Del MD, have reviewed the above documentation for accuracy and completeness, and I agree with the above.

## 2018-12-19 ENCOUNTER — Inpatient Hospital Stay: Payer: 59 | Attending: Oncology | Admitting: Oncology

## 2018-12-19 ENCOUNTER — Inpatient Hospital Stay: Payer: 59

## 2018-12-19 ENCOUNTER — Other Ambulatory Visit: Payer: Self-pay

## 2018-12-19 ENCOUNTER — Telehealth: Payer: Self-pay | Admitting: *Deleted

## 2018-12-19 VITALS — BP 106/60 | HR 72 | Temp 99.1°F | Resp 18 | Ht 66.0 in | Wt 140.2 lb

## 2018-12-19 DIAGNOSIS — C50412 Malignant neoplasm of upper-outer quadrant of left female breast: Secondary | ICD-10-CM

## 2018-12-19 DIAGNOSIS — Z170 Estrogen receptor positive status [ER+]: Secondary | ICD-10-CM

## 2018-12-19 DIAGNOSIS — Z5189 Encounter for other specified aftercare: Secondary | ICD-10-CM | POA: Diagnosis not present

## 2018-12-19 DIAGNOSIS — L658 Other specified nonscarring hair loss: Secondary | ICD-10-CM

## 2018-12-19 DIAGNOSIS — Z5111 Encounter for antineoplastic chemotherapy: Secondary | ICD-10-CM | POA: Diagnosis not present

## 2018-12-19 DIAGNOSIS — C773 Secondary and unspecified malignant neoplasm of axilla and upper limb lymph nodes: Secondary | ICD-10-CM | POA: Insufficient documentation

## 2018-12-19 DIAGNOSIS — Z95828 Presence of other vascular implants and grafts: Secondary | ICD-10-CM

## 2018-12-19 LAB — CBC WITH DIFFERENTIAL/PLATELET
Abs Immature Granulocytes: 0.8 10*3/uL — ABNORMAL HIGH (ref 0.00–0.07)
Basophils Absolute: 0.1 10*3/uL (ref 0.0–0.1)
Basophils Relative: 1 %
Eosinophils Absolute: 0 10*3/uL (ref 0.0–0.5)
Eosinophils Relative: 0 %
HCT: 35.2 % — ABNORMAL LOW (ref 36.0–46.0)
Hemoglobin: 11.5 g/dL — ABNORMAL LOW (ref 12.0–15.0)
Immature Granulocytes: 10 %
Lymphocytes Relative: 16 %
Lymphs Abs: 1.3 10*3/uL (ref 0.7–4.0)
MCH: 31 pg (ref 26.0–34.0)
MCHC: 32.7 g/dL (ref 30.0–36.0)
MCV: 94.9 fL (ref 80.0–100.0)
Monocytes Absolute: 0.8 10*3/uL (ref 0.1–1.0)
Monocytes Relative: 10 %
Neutro Abs: 4.9 10*3/uL (ref 1.7–7.7)
Neutrophils Relative %: 63 %
Platelets: 260 10*3/uL (ref 150–400)
RBC: 3.71 MIL/uL — ABNORMAL LOW (ref 3.87–5.11)
RDW: 13.3 % (ref 11.5–15.5)
WBC: 7.8 10*3/uL (ref 4.0–10.5)
nRBC: 0 % (ref 0.0–0.2)

## 2018-12-19 LAB — COMPREHENSIVE METABOLIC PANEL
ALT: 23 U/L (ref 0–44)
AST: 15 U/L (ref 15–41)
Albumin: 3.5 g/dL (ref 3.5–5.0)
Alkaline Phosphatase: 46 U/L (ref 38–126)
Anion gap: 7 (ref 5–15)
BUN: 17 mg/dL (ref 6–20)
CO2: 26 mmol/L (ref 22–32)
Calcium: 8.6 mg/dL — ABNORMAL LOW (ref 8.9–10.3)
Chloride: 108 mmol/L (ref 98–111)
Creatinine, Ser: 0.77 mg/dL (ref 0.44–1.00)
GFR calc Af Amer: >60 mL/min (ref >60)
GFR calc non Af Amer: >60 mL/min (ref >60)
Glucose, Bld: 112 mg/dL — ABNORMAL HIGH (ref 70–99)
Potassium: 4.2 mmol/L (ref 3.5–5.1)
Sodium: 141 mmol/L (ref 135–145)
Total Bilirubin: <0.2 mg/dL — ABNORMAL LOW (ref 0.3–1.2)
Total Protein: 6.2 g/dL — ABNORMAL LOW (ref 6.5–8.1)

## 2018-12-19 MED ORDER — PALONOSETRON HCL INJECTION 0.25 MG/5ML
INTRAVENOUS | Status: AC
  Filled 2018-12-19: qty 5

## 2018-12-19 MED ORDER — SODIUM CHLORIDE 0.9 % IV SOLN
600.0000 mg/m2 | Freq: Once | INTRAVENOUS | Status: AC
  Administered 2018-12-19: 1040 mg via INTRAVENOUS
  Filled 2018-12-19: qty 52

## 2018-12-19 MED ORDER — SODIUM CHLORIDE 0.9% FLUSH
10.0000 mL | INTRAVENOUS | Status: DC | PRN
  Administered 2018-12-19: 17:00:00 10 mL
  Filled 2018-12-19: qty 10

## 2018-12-19 MED ORDER — PEGFILGRASTIM 6 MG/0.6ML ~~LOC~~ PSKT
6.0000 mg | PREFILLED_SYRINGE | Freq: Once | SUBCUTANEOUS | Status: AC
  Administered 2018-12-19: 6 mg via SUBCUTANEOUS

## 2018-12-19 MED ORDER — ACETAMINOPHEN 325 MG PO TABS
ORAL_TABLET | ORAL | Status: AC
  Filled 2018-12-19: qty 2

## 2018-12-19 MED ORDER — HEPARIN SOD (PORK) LOCK FLUSH 100 UNIT/ML IV SOLN
500.0000 [IU] | Freq: Once | INTRAVENOUS | Status: AC | PRN
  Administered 2018-12-19: 500 [IU]
  Filled 2018-12-19: qty 5

## 2018-12-19 MED ORDER — PALONOSETRON HCL INJECTION 0.25 MG/5ML
0.2500 mg | Freq: Once | INTRAVENOUS | Status: AC
  Administered 2018-12-19: 0.25 mg via INTRAVENOUS

## 2018-12-19 MED ORDER — PEGFILGRASTIM 6 MG/0.6ML ~~LOC~~ PSKT
PREFILLED_SYRINGE | SUBCUTANEOUS | Status: AC
  Filled 2018-12-19: qty 0.6

## 2018-12-19 MED ORDER — VALACYCLOVIR HCL 1 G PO TABS: 1000.0000 mg | ORAL_TABLET | Freq: Every day | ORAL | 0 refills | Status: DC

## 2018-12-19 MED ORDER — SODIUM CHLORIDE 0.9 % IV SOLN
Freq: Once | INTRAVENOUS | Status: AC
  Administered 2018-12-19: 14:00:00 via INTRAVENOUS
  Filled 2018-12-19: qty 250

## 2018-12-19 MED ORDER — SODIUM CHLORIDE 0.9% FLUSH
10.0000 mL | Freq: Once | INTRAVENOUS | Status: AC
  Administered 2018-12-19: 10 mL
  Filled 2018-12-19: qty 10

## 2018-12-19 MED ORDER — DOXORUBICIN HCL CHEMO IV INJECTION 2 MG/ML
60.0000 mg/m2 | Freq: Once | INTRAVENOUS | Status: AC
  Administered 2018-12-19: 104 mg via INTRAVENOUS
  Filled 2018-12-19: qty 52

## 2018-12-19 MED ORDER — DIPHENHYDRAMINE HCL 25 MG PO CAPS
ORAL_CAPSULE | ORAL | Status: AC
  Filled 2018-12-19: qty 1

## 2018-12-19 MED ORDER — SODIUM CHLORIDE 0.9 % IV SOLN
Freq: Once | INTRAVENOUS | Status: AC
  Administered 2018-12-19: 14:00:00 via INTRAVENOUS
  Filled 2018-12-19: qty 5

## 2018-12-19 NOTE — Telephone Encounter (Signed)
Spoke to pt prior to [facility] cycle 3. Relate doing well. Informed she feels like she's in a fog the week of chemo with no c/o n/v. Feels more like herself on her week off. Pt did lose her hair which was traumatic for her. She's doing better now with the loss. Gave emotional support and encouragement. Denies further needs at this time.

## 2018-12-19 NOTE — Patient Instructions (Signed)
[facility] Discharge Instructions for Patients Receiving Chemotherapy  Today you received the following chemotherapy agents Adriamycin, Cytoxan  To help prevent nausea and vomiting after your treatment, we encourage you to take your nausea medication as directed by MD   If you develop nausea and vomiting that is not controlled by your nausea medication, call the clinic.   BELOW ARE SYMPTOMS THAT SHOULD BE REPORTED IMMEDIATELY:  *FEVER GREATER THAN 100.5 F  *CHILLS WITH OR WITHOUT FEVER  NAUSEA AND VOMITING THAT IS NOT CONTROLLED WITH YOUR NAUSEA MEDICATION  *UNUSUAL SHORTNESS OF BREATH  *UNUSUAL BRUISING OR BLEEDING  TENDERNESS IN MOUTH AND THROAT WITH OR WITHOUT PRESENCE OF ULCERS  *URINARY PROBLEMS  *BOWEL PROBLEMS  UNUSUAL RASH Items with * indicate a potential emergency and should be followed up as soon as possible.  Feel free to call the clinic should you have any questions or concerns. The clinic phone number is [phone].  Please show the [place] at check-in to the Emergency Department and triage nurse.  Doxorubicin injection What is this medicine? DOXORUBICIN (dox oh ROO bi sin) is a chemotherapy drug. It is used to treat many kinds of cancer like leukemia, lymphoma, neuroblastoma, sarcoma, and Wilms' tumor. It is also used to treat bladder cancer, breast cancer, lung cancer, ovarian cancer, stomach cancer, and thyroid cancer. This medicine may be used for other purposes; ask your health care provider or pharmacist if you have questions. COMMON BRAND NAME(S): Adriamycin, Adriamycin PFS, Adriamycin RDF, Rubex What should I tell my health care provider before I take this medicine? They need to know if you have any of these conditions: -heart disease -history of low blood counts caused by a medicine -liver disease -recent or ongoing radiation therapy -an unusual or allergic reaction to doxorubicin, other chemotherapy agents, other  medicines, foods, dyes, or preservatives -pregnant or trying to get pregnant -breast-feeding How should I use this medicine? This drug is given as an infusion into a vein. It is administered in a hospital or clinic by a specially trained health care professional. If you have pain, swelling, burning or any unusual feeling around the site of your injection, tell your health care professional right away. Talk to your pediatrician regarding the use of this medicine in children. Special care may be needed. Overdosage: If you think you have taken too much of this medicine contact a poison control center or emergency room at once. NOTE: This medicine is only for you. Do not share this medicine with others. What if I miss a dose? It is important not to miss your dose. Call your doctor or health care professional if you are unable to keep an appointment. What may interact with this medicine? This medicine may interact with the following medications: -6-mercaptopurine -paclitaxel -phenytoin -St. John's Wort -trastuzumab -verapamil This list may not describe all possible interactions. Give your health care provider a list of all the medicines, herbs, non-prescription drugs, or dietary supplements you use. Also tell them if you smoke, drink alcohol, or use illegal drugs. Some items may interact with your medicine. What should I watch for while using this medicine? This drug may make you feel generally unwell. This is not uncommon, as chemotherapy can affect healthy cells as well as cancer cells. Report any side effects. Continue your course of treatment even though you feel ill unless your doctor tells you to stop. There is a maximum amount of this medicine you should receive throughout your life. The amount depends  on the medical condition being treated and your overall health. Your doctor will watch how much of this medicine you receive in your lifetime. Tell your doctor if you have taken this medicine  before. You may need blood work done while you are taking this medicine. Your urine may turn red for a few days after your dose. This is not blood. If your urine is dark or brown, call your doctor. In some cases, you may be given additional medicines to help with side effects. Follow all directions for their use. Call your doctor or health care professional for advice if you get a fever, chills or sore throat, or other symptoms of a cold or flu. Do not treat yourself. This drug decreases your body's ability to fight infections. Try to avoid being around people who are sick. This medicine may increase your risk to bruise or bleed. Call your doctor or health care professional if you notice any unusual bleeding. Talk to your doctor about your risk of cancer. You may be more at risk for certain types of cancers if you take this medicine. Do not become pregnant while taking this medicine or for 6 months after stopping it. Women should inform their doctor if they wish to become pregnant or think they might be pregnant. Men should not father a child while taking this medicine and for 6 months after stopping it. There is a potential for serious side effects to an unborn child. Talk to your health care professional or pharmacist for more information. Do not breast-feed an infant while taking this medicine. This medicine has caused ovarian failure in some women and reduced sperm counts in some men This medicine may interfere with the ability to have a child. Talk with your doctor or health care professional if you are concerned about your fertility. This medicine may cause a decrease in Co-Enzyme Q-10. You should make sure that you get enough Co-Enzyme Q-10 while you are taking this medicine. Discuss the foods you eat and the vitamins you take with your health care professional. What side effects may I notice from receiving this medicine? Side effects that you should report to your doctor or health care  professional as soon as possible: -allergic reactions like skin rash, itching or hives, swelling of the face, lips, or tongue -breathing problems -chest pain -fast or irregular heartbeat -low blood counts - this medicine may decrease the number of white blood cells, red blood cells and platelets. You may be at increased risk for infections and bleeding. -pain, redness, or irritation at site where injected -signs of infection - fever or chills, cough, sore throat, pain or difficulty passing urine -signs of decreased platelets or bleeding - bruising, pinpoint red spots on the skin, black, tarry stools, blood in the urine -swelling of the ankles, feet, hands -tiredness -weakness Side effects that usually do not require medical attention (report to your doctor or health care professional if they continue or are bothersome): -diarrhea -hair loss -mouth sores -nail discoloration or damage -nausea -red colored urine -vomiting This list may not describe all possible side effects. Call your doctor for medical advice about side effects. You may report side effects to FDA at 1-800-FDA-1088. Where should I keep my medicine? This drug is given in a hospital or clinic and will not be stored at home. NOTE: This sheet is a summary. It may not cover all possible information. If you have questions about this medicine, talk to your doctor, pharmacist, or health care provider.  2019 Elsevier/Gold Standard (2017-02-16 11:01:26)  Cyclophosphamide injection What is this medicine? CYCLOPHOSPHAMIDE (sye kloe FOSS fa mide) is a chemotherapy drug. It slows the growth of cancer cells. This medicine is used to treat many types of cancer like lymphoma, myeloma, leukemia, breast cancer, and ovarian cancer, to name a few. This medicine may be used for other purposes; ask your health care provider or pharmacist if you have questions. COMMON BRAND NAME(S): Cytoxan, Neosar What should I tell my health care provider  before I take this medicine? They need to know if you have any of these conditions: -blood disorders -history of other chemotherapy -infection -kidney disease -liver disease -recent or ongoing radiation therapy -tumors in the bone marrow -an unusual or allergic reaction to cyclophosphamide, other chemotherapy, other medicines, foods, dyes, or preservatives -pregnant or trying to get pregnant -breast-feeding How should I use this medicine? This drug is usually given as an injection into a vein or muscle or by infusion into a vein. It is administered in a hospital or clinic by a specially trained health care professional. Talk to your pediatrician regarding the use of this medicine in children. Special care may be needed. Overdosage: If you think you have taken too much of this medicine contact a poison control center or emergency room at once. NOTE: This medicine is only for you. Do not share this medicine with others. What if I miss a dose? It is important not to miss your dose. Call your doctor or health care professional if you are unable to keep an appointment. What may interact with this medicine? This medicine may interact with the following medications: -amiodarone -amphotericin B -azathioprine -certain antiviral medicines for HIV or AIDS such as protease inhibitors (e.g., indinavir, ritonavir) and zidovudine -certain blood pressure medications such as benazepril, captopril, enalapril, fosinopril, lisinopril, moexipril, monopril, perindopril, quinapril, ramipril, trandolapril -certain cancer medications such as anthracyclines (e.g., daunorubicin, doxorubicin), busulfan, cytarabine, paclitaxel, pentostatin, tamoxifen, trastuzumab -certain diuretics such as chlorothiazide, chlorthalidone, hydrochlorothiazide, indapamide, metolazone -certain medicines that treat or prevent blood clots like warfarin -certain muscle relaxants such as  succinylcholine -cyclosporine -etanercept -indomethacin -medicines to increase blood counts like filgrastim, pegfilgrastim, sargramostim -medicines used as general anesthesia -metronidazole -natalizumab This list may not describe all possible interactions. Give your health care provider a list of all the medicines, herbs, non-prescription drugs, or dietary supplements you use. Also tell them if you smoke, drink alcohol, or use illegal drugs. Some items may interact with your medicine. What should I watch for while using this medicine? Visit your doctor for checks on your progress. This drug may make you feel generally unwell. This is not uncommon, as chemotherapy can affect healthy cells as well as cancer cells. Report any side effects. Continue your course of treatment even though you feel ill unless your doctor tells you to stop. Drink water or other fluids as directed. Urinate often, even at night. In some cases, you may be given additional medicines to help with side effects. Follow all directions for their use. Call your doctor or health care professional for advice if you get a fever, chills or sore throat, or other symptoms of a cold or flu. Do not treat yourself. This drug decreases your body's ability to fight infections. Try to avoid being around people who are sick. This medicine may increase your risk to bruise or bleed. Call your doctor or health care professional if you notice any unusual bleeding. Be careful brushing and flossing your teeth or using a toothpick because you  may get an infection or bleed more easily. If you have any dental work done, tell your dentist you are receiving this medicine. You may get drowsy or dizzy. Do not drive, use machinery, or do anything that needs mental alertness until you know how this medicine affects you. Do not become pregnant while taking this medicine or for 1 year after stopping it. Women should inform their doctor if they wish to become  pregnant or think they might be pregnant. Men should not father a child while taking this medicine and for 4 months after stopping it. There is a potential for serious side effects to an unborn child. Talk to your health care professional or pharmacist for more information. Do not breast-feed an infant while taking this medicine. This medicine may interfere with the ability to have a child. This medicine has caused ovarian failure in some women. This medicine has caused reduced sperm counts in some men. You should talk with your doctor or health care professional if you are concerned about your fertility. If you are going to have surgery, tell your doctor or health care professional that you have taken this medicine. What side effects may I notice from receiving this medicine? Side effects that you should report to your doctor or health care professional as soon as possible: -allergic reactions like skin rash, itching or hives, swelling of the face, lips, or tongue -low blood counts - this medicine may decrease the number of white blood cells, red blood cells and platelets. You may be at increased risk for infections and bleeding. -signs of infection - fever or chills, cough, sore throat, pain or difficulty passing urine -signs of decreased platelets or bleeding - bruising, pinpoint red spots on the skin, black, tarry stools, blood in the urine -signs of decreased red blood cells - unusually weak or tired, fainting spells, lightheadedness -breathing problems -dark urine -dizziness -palpitations -swelling of the ankles, feet, hands -trouble passing urine or change in the amount of urine -weight gain -yellowing of the eyes or skin Side effects that usually do not require medical attention (report to your doctor or health care professional if they continue or are bothersome): -changes in nail or skin color -hair loss -missed menstrual periods -mouth sores -nausea, vomiting This list may not  describe all possible side effects. Call your doctor for medical advice about side effects. You may report side effects to FDA at 1-800-FDA-1088. Where should I keep my medicine? This drug is given in a hospital or clinic and will not be stored at home. NOTE: This sheet is a summary. It may not cover all possible information. If you have questions about this medicine, talk to your doctor, pharmacist, or health care provider.  2019 Elsevier/Gold Standard (2012-05-19 16:22:58)

## 2018-12-20 ENCOUNTER — Telehealth: Payer: Self-pay | Admitting: Oncology

## 2018-12-20 NOTE — Telephone Encounter (Signed)
Talk with patient regarding schedule °

## 2018-12-22 ENCOUNTER — Telehealth: Payer: Self-pay | Admitting: Plastic Surgery

## 2018-12-22 ENCOUNTER — Ambulatory Visit: Payer: 59 | Admitting: Plastic Surgery

## 2018-12-22 NOTE — Telephone Encounter (Signed)

## 2018-12-25 ENCOUNTER — Encounter: Payer: Self-pay | Admitting: Plastic Surgery

## 2018-12-25 ENCOUNTER — Other Ambulatory Visit: Payer: Self-pay

## 2018-12-25 ENCOUNTER — Ambulatory Visit (INDEPENDENT_AMBULATORY_CARE_PROVIDER_SITE_OTHER): Payer: 59 | Admitting: Plastic Surgery

## 2018-12-25 VITALS — BP 101/65 | HR 100 | Temp 98.5°F | Ht 66.0 in | Wt 138.4 lb

## 2018-12-25 DIAGNOSIS — Z170 Estrogen receptor positive status [ER+]: Secondary | ICD-10-CM

## 2018-12-25 DIAGNOSIS — C50412 Malignant neoplasm of upper-outer quadrant of left female breast: Secondary | ICD-10-CM

## 2018-12-25 DIAGNOSIS — Z9012 Acquired absence of left breast and nipple: Secondary | ICD-10-CM

## 2018-12-25 NOTE — Progress Notes (Signed)
   Subjective:    Patient ID: [name], female    DOB: 09-07-1970, 48 y.o.   MRN: [id number]  The patient is a 48 year old female here for follow-up on her left breast reconstruction.  She has been doing extremely well.  She still getting her chemotherapy and tolerating it well.  Her incision from the left mastectomy is healing nicely.  There is no sign of seroma or hematoma.  She is about even at this point in time.  We will do slight still more just for ease of closure.   Review of Systems  Constitutional: Negative.   HENT: Negative.   Eyes: Negative.   Respiratory: Negative.   Cardiovascular: Negative.   Gastrointestinal: Negative.   Genitourinary: Negative.   Musculoskeletal: Negative.   Hematological: Negative.   Psychiatric/Behavioral: Negative.        Objective:   Physical Exam Vitals signs and nursing note reviewed.  Constitutional:      Appearance: Normal appearance.  HENT:     Head: Normocephalic and atraumatic.  Cardiovascular:     Rate and Rhythm: Normal rate.     Pulses: Normal pulses.  Pulmonary:     Effort: Pulmonary effort is normal. No respiratory distress.  Neurological:     General: No focal deficit present.     Mental Status: She is alert and oriented to person, place, and time.  Psychiatric:        Mood and Affect: Mood normal.        Behavior: Behavior normal.        Thought Content: Thought content normal.        Assessment & Plan:  Malignant neoplasm of upper-outer quadrant of left breast in female, estrogen receptor positive ([place])  Acquired absence of left breast  The patient is considering whether or not she wants to have an implant placed on the right side.  She still has to finish up chemo.  She has 14 more weeks for that.  Then we will try to do her exchange on the left side prior to her starting radiation.  She may want to wait until she is finished with radiation before she does anything to the right side.  I expressed to her  that there may be some shrinkage of the left side after the radiation. Pictures were obtained of the patient and placed in the chart with the patient's or guardian's permission.  We placed injectable saline in the Expander using a sterile technique: Left: 50 cc for a total of 200 / 300 cc

## 2019-01-02 ENCOUNTER — Inpatient Hospital Stay: Payer: 59

## 2019-01-02 ENCOUNTER — Encounter: Payer: Self-pay | Admitting: Adult Health

## 2019-01-02 ENCOUNTER — Other Ambulatory Visit: Payer: Self-pay

## 2019-01-02 ENCOUNTER — Inpatient Hospital Stay (HOSPITAL_BASED_OUTPATIENT_CLINIC_OR_DEPARTMENT_OTHER): Payer: 59 | Admitting: Adult Health

## 2019-01-02 VITALS — BP 96/58 | HR 91 | Temp 98.3°F | Resp 18 | Ht 66.0 in | Wt 140.8 lb

## 2019-01-02 DIAGNOSIS — C50412 Malignant neoplasm of upper-outer quadrant of left female breast: Secondary | ICD-10-CM

## 2019-01-02 DIAGNOSIS — Z170 Estrogen receptor positive status [ER+]: Secondary | ICD-10-CM | POA: Diagnosis not present

## 2019-01-02 DIAGNOSIS — Z95828 Presence of other vascular implants and grafts: Secondary | ICD-10-CM

## 2019-01-02 DIAGNOSIS — R5383 Other fatigue: Secondary | ICD-10-CM | POA: Diagnosis not present

## 2019-01-02 DIAGNOSIS — Z5111 Encounter for antineoplastic chemotherapy: Secondary | ICD-10-CM | POA: Diagnosis not present

## 2019-01-02 DIAGNOSIS — N939 Abnormal uterine and vaginal bleeding, unspecified: Secondary | ICD-10-CM

## 2019-01-02 LAB — COMPREHENSIVE METABOLIC PANEL
ALT: 16 U/L (ref 0–44)
AST: 14 U/L — ABNORMAL LOW (ref 15–41)
Albumin: 3.7 g/dL (ref 3.5–5.0)
Alkaline Phosphatase: 57 U/L (ref 38–126)
Anion gap: 11 (ref 5–15)
BUN: 11 mg/dL (ref 6–20)
CO2: 23 mmol/L (ref 22–32)
Calcium: 8.7 mg/dL — ABNORMAL LOW (ref 8.9–10.3)
Chloride: 108 mmol/L (ref 98–111)
Creatinine, Ser: 0.72 mg/dL (ref 0.44–1.00)
GFR calc Af Amer: >60 mL/min (ref >60)
GFR calc non Af Amer: >60 mL/min (ref >60)
Glucose, Bld: 100 mg/dL — ABNORMAL HIGH (ref 70–99)
Potassium: 3.8 mmol/L (ref 3.5–5.1)
Sodium: 142 mmol/L (ref 135–145)
Total Bilirubin: <0.2 mg/dL — ABNORMAL LOW (ref 0.3–1.2)
Total Protein: 6.5 g/dL (ref 6.5–8.1)

## 2019-01-02 LAB — CBC WITH DIFFERENTIAL/PLATELET
Abs Immature Granulocytes: 0.59 10*3/uL — ABNORMAL HIGH (ref 0.00–0.07)
Basophils Absolute: 0.1 10*3/uL (ref 0.0–0.1)
Basophils Relative: 1 %
Eosinophils Absolute: 0 10*3/uL (ref 0.0–0.5)
Eosinophils Relative: 0 %
HCT: 34.4 % — ABNORMAL LOW (ref 36.0–46.0)
Hemoglobin: 11.4 g/dL — ABNORMAL LOW (ref 12.0–15.0)
Immature Granulocytes: 8 %
Lymphocytes Relative: 14 %
Lymphs Abs: 1.1 10*3/uL (ref 0.7–4.0)
MCH: 31.2 pg (ref 26.0–34.0)
MCHC: 33.1 g/dL (ref 30.0–36.0)
MCV: 94.2 fL (ref 80.0–100.0)
Monocytes Absolute: 0.7 10*3/uL (ref 0.1–1.0)
Monocytes Relative: 10 %
Neutro Abs: 4.9 10*3/uL (ref 1.7–7.7)
Neutrophils Relative %: 67 %
Platelets: 240 10*3/uL (ref 150–400)
RBC: 3.65 MIL/uL — ABNORMAL LOW (ref 3.87–5.11)
RDW: 14.8 % (ref 11.5–15.5)
WBC: 7.4 10*3/uL (ref 4.0–10.5)
nRBC: 0 % (ref 0.0–0.2)

## 2019-01-02 MED ORDER — PALONOSETRON HCL INJECTION 0.25 MG/5ML
0.2500 mg | Freq: Once | INTRAVENOUS | Status: AC
  Administered 2019-01-02: 0.25 mg via INTRAVENOUS

## 2019-01-02 MED ORDER — SODIUM CHLORIDE 0.9 % IV SOLN
600.0000 mg/m2 | Freq: Once | INTRAVENOUS | Status: AC
  Administered 2019-01-02: 1040 mg via INTRAVENOUS
  Filled 2019-01-02: qty 52

## 2019-01-02 MED ORDER — PEGFILGRASTIM 6 MG/0.6ML ~~LOC~~ PSKT
PREFILLED_SYRINGE | SUBCUTANEOUS | Status: AC
  Filled 2019-01-02: qty 0.6

## 2019-01-02 MED ORDER — SODIUM CHLORIDE 0.9% FLUSH
10.0000 mL | INTRAVENOUS | Status: DC | PRN
  Administered 2019-01-02: 10 mL
  Filled 2019-01-02: qty 10

## 2019-01-02 MED ORDER — PEGFILGRASTIM 6 MG/0.6ML ~~LOC~~ PSKT
6.0000 mg | PREFILLED_SYRINGE | Freq: Once | SUBCUTANEOUS | Status: AC
  Administered 2019-01-02: 6 mg via SUBCUTANEOUS

## 2019-01-02 MED ORDER — PALONOSETRON HCL INJECTION 0.25 MG/5ML
INTRAVENOUS | Status: AC
  Filled 2019-01-02: qty 5

## 2019-01-02 MED ORDER — DOXORUBICIN HCL CHEMO IV INJECTION 2 MG/ML
60.0000 mg/m2 | Freq: Once | INTRAVENOUS | Status: AC
  Administered 2019-01-02: 104 mg via INTRAVENOUS
  Filled 2019-01-02: qty 52

## 2019-01-02 MED ORDER — SODIUM CHLORIDE 0.9% FLUSH
10.0000 mL | Freq: Once | INTRAVENOUS | Status: AC
  Administered 2019-01-02: 10 mL
  Filled 2019-01-02: qty 10

## 2019-01-02 MED ORDER — SODIUM CHLORIDE 0.9 % IV SOLN
Freq: Once | INTRAVENOUS | Status: AC
  Administered 2019-01-02: 11:00:00 via INTRAVENOUS
  Filled 2019-01-02: qty 250

## 2019-01-02 MED ORDER — SODIUM CHLORIDE 0.9 % IV SOLN
Freq: Once | INTRAVENOUS | Status: AC
  Administered 2019-01-02: 12:00:00 via INTRAVENOUS
  Filled 2019-01-02: qty 5

## 2019-01-02 MED ORDER — HEPARIN SOD (PORK) LOCK FLUSH 100 UNIT/ML IV SOLN
500.0000 [IU] | Freq: Once | INTRAVENOUS | Status: AC | PRN
  Administered 2019-01-02: 500 [IU]
  Filled 2019-01-02: qty 5

## 2019-01-02 NOTE — Patient Instructions (Signed)
Goserelin injection What is this medicine? GOSERELIN (GOE se rel in) is similar to a hormone found in the body. It lowers the amount of sex hormones that the body makes. Men will have lower testosterone levels and women will have lower estrogen levels while taking this medicine. In men, this medicine is used to treat prostate cancer; the injection is either given once per month or once every 12 weeks. A once per month injection (only) is used to treat women with endometriosis, dysfunctional uterine bleeding, or advanced breast cancer. This medicine may be used for other purposes; ask your health care provider or pharmacist if you have questions. COMMON BRAND NAME(S): Zoladex What should I tell my health care provider before I take this medicine? They need to know if you have any of these conditions (some only apply to women): -diabetes -heart disease or previous heart attack -high blood pressure -high cholesterol -kidney disease -osteoporosis or low bone density -problems passing urine -spinal cord injury -stroke -tobacco smoker -an unusual or allergic reaction to goserelin, hormone therapy, other medicines, foods, dyes, or preservatives -pregnant or trying to get pregnant -breast-feeding How should I use this medicine? This medicine is for injection under the skin. It is given by a health care professional in a hospital or clinic setting. Men receive this injection once every 4 weeks or once every 12 weeks. Women will only receive the once every 4 weeks injection. Talk to your pediatrician regarding the use of this medicine in children. Special care may be needed. Overdosage: If you think you have taken too much of this medicine contact a poison control center or emergency room at once. NOTE: This medicine is only for you. Do not share this medicine with others. What if I miss a dose? It is important not to miss your dose. Call your doctor or health care professional if you are unable to  keep an appointment. What may interact with this medicine? -female hormones like estrogen -herbal or dietary supplements like black cohosh, chasteberry, or DHEA -female hormones like testosterone -prasterone This list may not describe all possible interactions. Give your health care provider a list of all the medicines, herbs, non-prescription drugs, or dietary supplements you use. Also tell them if you smoke, drink alcohol, or use illegal drugs. Some items may interact with your medicine. What should I watch for while using this medicine? Visit your doctor or health care professional for regular checks on your progress. Your symptoms may appear to get worse during the first weeks of this therapy. Tell your doctor or healthcare professional if your symptoms do not start to get better or if they get worse after this time. Your bones may get weaker if you take this medicine for a long time. If you smoke or frequently drink alcohol you may increase your risk of bone loss. A family history of osteoporosis, chronic use of drugs for seizures (convulsions), or corticosteroids can also increase your risk of bone loss. Talk to your doctor about how to keep your bones strong. This medicine should stop regular monthly menstration in women. Tell your doctor if you continue to menstrate. Women should not become pregnant while taking this medicine or for 12 weeks after stopping this medicine. Women should inform their doctor if they wish to become pregnant or think they might be pregnant. There is a potential for serious side effects to an unborn child. Talk to your health care professional or pharmacist for more information. Do not breast-feed an infant while taking   this medicine. Men should inform their doctors if they wish to father a child. This medicine may lower sperm counts. Talk to your health care professional or pharmacist for more information. What side effects may I notice from receiving this  medicine? Side effects that you should report to your doctor or health care professional as soon as possible: -allergic reactions like skin rash, itching or hives, swelling of the face, lips, or tongue -bone pain -breathing problems -changes in vision -chest pain -feeling faint or lightheaded, falls -fever, chills -pain, swelling, warmth in the leg -pain, tingling, numbness in the hands or feet -signs and symptoms of low blood pressure like dizziness; feeling faint or lightheaded, falls; unusually weak or tired -stomach pain -swelling of the ankles, feet, hands -trouble passing urine or change in the amount of urine -unusually high or low blood pressure -unusually weak or tired Side effects that usually do not require medical attention (report to your doctor or health care professional if they continue or are bothersome): -change in sex drive or performance -changes in breast size in both males and females -changes in emotions or moods -headache -hot flashes -irritation at site where injected -loss of appetite -skin problems like acne, dry skin -vaginal dryness This list may not describe all possible side effects. Call your doctor for medical advice about side effects. You may report side effects to FDA at 1-800-FDA-1088. Where should I keep my medicine? This drug is given in a hospital or clinic and will not be stored at home. NOTE: This sheet is a summary. It may not cover all possible information. If you have questions about this medicine, talk to your doctor, pharmacist, or health care provider.  2019 Elsevier/Gold Standard (2013-09-11 11:10:35)  

## 2019-01-02 NOTE — Progress Notes (Signed)
[place]  [phone] [phone]    ID: [name] DOB: [birth year]  MR#: [id number]  WLN#:[id number]  Patient Care Team: Orpah Melter, MD as PCP - General (Family Medicine) Mauro Kaufmann, RN as Oncology Nurse Navigator Rockwell Germany, RN as Oncology Nurse Navigator Jovita Kussmaul, MD as Consulting Physician (General Surgery) Magrinat, Virgie Dad, MD as Consulting Physician (Oncology) Kyung Rudd, MD as Consulting Physician (Radiation Oncology) Aloha Gell, MD as Consulting Physician (Obstetrics and Gynecology) Dillingham, Loel Lofty, DO as Attending Physician (Plastic Surgery) Scot Dock, NP OTHER MD:   CHIEF COMPLAINT: Estrogen receptor positive breast cancer  CURRENT TREATMENT: Adjuvant chemotherapy   INTERVAL HISTORY: [name] was seen today for follow-up and treatment of her estrogen receptor positive breast cancer.   She continues on adjuvant chemotherapy consisting of doxorubicin and cyclophosphamide in dose dense fashion x4, to be followed by weekly paclitaxel x12. Today is day 1 cycle 4 of doxorubicin and cyclophosphamide.   REVIEW OF SYSTEMS: [name] was more fatigued with cycle 3.  She wonders if it is related to the valtrex that she started with cycle 3.  She did not get any oral ulcers. She did have her IUD changed prior to starting chemotherapy.  She has noted some spotting over the past week, in addition to her regular menstrual cycle a couple of weeks ago.  She wants to make sure her hemoglobin isn't lower due to her menses.    [name] is otherwise doing well.  She denies any fever or chills.  She is without cough, shortness of breath, chest pain or palpitations.  She hasn't noted dysphagia, nausea/vomiting, bowel/bladder changes.  A detailed ROS was otherwise non contributory.    HISTORY OF CURRENT ILLNESS: From the original intake note:  "[name]" presented with left nipple retraction for 1 week with  retroareolar mass in the upper outer quadrant. She underwent bilateral diagnostic mammography with CAD and left breast ultrasonography at [facility] on 09/25/2018 showing: 7 mm oval duct in the left breast; no significant abnormalities in the left axilla. She also underwent additional imaging with left breast digital diagnostic mammogram on 10/02/2018 showing: new 0.4 cm cluster of grouped heterogeneous calcifications in the left breast.   Accordingly on 10/02/2018 she proceeded to biopsy of the left breast area in question. 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 Prognostic indicators significant for: estrogen receptor, 90% positive, with moderate staining intensity and progesterone receptor, 100% positive, with strong staining intensity. Proliferation marker Ki67 at <1%. HER2 negative (1+).   The patient's subsequent history is as detailed above.   PAST MEDICAL HISTORY: Past Medical History:  Diagnosis Date  . Cancer ([place]) 10/18/2018   left breast cancer    PAST SURGICAL HISTORY: Past Surgical History:  Procedure Laterality Date  . BREAST RECONSTRUCTION WITH PLACEMENT OF TISSUE EXPANDER AND FLEX HD (ACELLULAR HYDRATED DERMIS) Left 10/18/2018   Procedure: IMMEDIATEVBREAST RECONSTRUCTION WITH PLACEMENT OF TISSUE EXPANDER AND FLEX HD (ACELLULAR HYDRATED DERMIS);  Surgeon: Wallace Going, DO;  Location: [facility];  Service: Plastics;  Laterality: Left;  Marland Kitchen MASTECTOMY    . MASTECTOMY W/ SENTINEL NODE BIOPSY Left 10/18/2018   Procedure: LEFT MASTECTOMY WITH SENTINEL LYMPH NODE BIOPSY;  Surgeon: Jovita Kussmaul, MD;  Location: [facility];  Service: General;  Laterality: Left;  . PORTACATH PLACEMENT Right 11/02/2018    Procedure: INSERTION PORT-A-CATH WITH ULTRASOUND;  Surgeon: Jovita Kussmaul, MD;  Location: [facility];  Service: General;  Laterality: Right;    FAMILY HISTORY Family History  Problem Relation Age of Onset  . Breast cancer Neg Hx   . Ovarian cancer Neg Hx    As of March 2020, patient's father is living and healthy at age 24. Patient's mother is also living and healthy at age 48 The patient denies a family hx of breast or ovarian cancer. She has 1 sister.  GYNECOLOGIC HISTORY:  No LMP recorded. (Menstrual status: IUD). Menarche: 48 years old Age at first live birth: 48 years old [place] 2 LMP in 2010 Contraceptive: Mirena IUD has been replaced by a ParaGard IUD as of March 2020 She used birth control pills between ages 17 to 48 w/o event HRT n/a  Hysterectomy? no BSO? 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  She has a PhD in Probation officer.  Husband [name] is an Art gallery manager. She lives at home with her husband and 3 children. She has two children, [name] age 48 and [name] age 55. Husband [name] has one daughter, [name] age 48, who lives with them.    ADVANCED DIRECTIVES: In the absence of any documentation to the contrary her husband [name] is her HCPOA.   HEALTH MAINTENANCE: Social History   Tobacco Use  . Smoking status: Never Smoker  . Smokeless tobacco: Never Used  Substance Use Topics  . Alcohol use: Yes    Alcohol/week: 3.0 standard drinks    Types: 3 Standard drinks or equivalent per week    Comment: social  . Drug use: Never     Colonoscopy: never done  PAP: 08/2017  Bone density: never done   No Known Allergies  Current Outpatient Medications  Medication Sig Dispense Refill  . dexamethasone (DECADRON) 4 MG tablet Take 2 tablets by mouth once a day on the day after chemotherapy and then take 2 tablets two times a day for 2 days. Take with food. 30 tablet 1  . diazepam (VALIUM)  2 MG tablet Take 1 tablet (2 mg total) by mouth every 6 (six) hours as needed for anxiety. 30 tablet 0  . HYDROcodone-acetaminophen (NORCO) 5-325 MG tablet Take 1-2 tablets by mouth every 6 (six) hours as needed for moderate pain. 10 tablet 0  . lidocaine-prilocaine (EMLA) cream Apply to affected area once 30 g 3  . loratadine (CLARITIN) 10 MG tablet Take 1 tablet (10 mg total) by mouth daily. 90 tablet 0  . LORazepam (ATIVAN) 0.5 MG tablet Take 1 tablet (0.5 mg total) by mouth at bedtime as needed (Nausea or vomiting). 30 tablet 0  . prochlorperazine (COMPAZINE) 10 MG tablet Take 1 tablet (10 mg total) by mouth every 6 (six) hours as needed (Nausea or vomiting). 30 tablet 1  . valACYclovir (VALTREX) 1000 MG tablet Take 1 tablet (1,000 mg total) by mouth daily. 10 tablet 0   No current facility-administered medications for this visit.     OBJECTIVE:  Vitals:   01/02/19 1038  BP: (!) 96/58  Pulse: 91  Resp: 18  Temp: 98.3 F (36.8 C)  SpO2: 100%     Body mass index is 22.73 kg/m.   Wt Readings from Last 3 Encounters:  01/02/19 140 lb 12.8 oz (63.9 kg)  12/25/18 138  lb 6.4 oz (62.8 kg)  12/19/18 140 lb 3.2 oz (63.6 kg)      ECOG FS:1 - Symptomatic but completely ambulatory GENERAL: Patient is a well appearing female in no acute distress HEENT:  Sclerae anicteric.  Oropharynx clear and moist. No ulcerations or evidence of oropharyngeal candidiasis. Neck is supple.  NODES:  No cervical, supraclavicular, or axillary lymphadenopathy palpated.  BREAST EXAM:  Deferred. LUNGS:  Clear to auscultation bilaterally.  No wheezes or rhonchi. HEART:  Regular rate and rhythm. No murmur appreciated. ABDOMEN:  Soft, nontender.  Positive, normoactive bowel sounds. No organomegaly palpated. MSK:  No focal spinal tenderness to palpation. Full range of motion bilaterally in the upper extremities. EXTREMITIES:  No peripheral edema.   SKIN:  Clear with no obvious rashes or skin changes. No nail  dyscrasia. NEURO:  Nonfocal. Well oriented.  Appropriate affect.     LAB RESULTS:  CMP     Component Value Date/Time   NA 141 12/19/2018 1144   K 4.2 12/19/2018 1144   CL 108 12/19/2018 1144   CO2 26 12/19/2018 1144   GLUCOSE 112 (H) 12/19/2018 1144   BUN 17 12/19/2018 1144   CREATININE 0.77 12/19/2018 1144   CREATININE 0.75 12/01/2018 0808   CALCIUM 8.6 (L) 12/19/2018 1144   PROT 6.2 (L) 12/19/2018 1144   ALBUMIN 3.5 12/19/2018 1144   AST 15 12/19/2018 1144   AST 13 (L) 12/01/2018 0808   ALT 23 12/19/2018 1144   ALT 21 12/01/2018 0808   ALKPHOS 46 12/19/2018 1144   BILITOT <0.2 (L) 12/19/2018 1144   BILITOT <0.2 (L) 12/01/2018 0808   GFRNONAA >60 12/19/2018 1144   GFRNONAA >60 12/01/2018 0808   GFRAA >60 12/19/2018 1144   GFRAA >60 12/01/2018 0808    No results found for: TOTALPROTELP, ALBUMINELP, A1GS, A2GS, BETS, BETA2SER, GAMS, MSPIKE, SPEI  No results found for: KPAFRELGTCHN, LAMBDASER, KAPLAMBRATIO  Lab Results  Component Value Date   WBC 7.4 01/02/2019   NEUTROABS PENDING 01/02/2019   HGB 11.4 (L) 01/02/2019   HCT 34.4 (L) 01/02/2019   MCV 94.2 01/02/2019   PLT 240 01/02/2019    '@LASTCHEMISTRY' @  No results found for: LABCA2  No components found for: WUJWJX914  No results for input(s): INR in the last 168 hours.  No results found for: LABCA2  No results found for: NWG956  No results found for: OZH086  No results found for: VHQ469  No results found for: CA2729  No components found for: HGQUANT  No results found for: CEA1 / No results found for: CEA1   No results found for: AFPTUMOR  No results found for: CHROMOGRNA  No results found for: PSA1  Appointment on 01/02/2019  Component Date Value Ref Range Status  . WBC 01/02/2019 7.4  4.0 - 10.5 K/uL Final  . RBC 01/02/2019 3.65* 3.87 - 5.11 MIL/uL Final  . Hemoglobin 01/02/2019 11.4* 12.0 - 15.0 g/dL Final  . HCT 01/02/2019 34.4* 36.0 - 46.0 % Final  . MCV 01/02/2019 94.2  80.0 -  100.0 fL Final  . MCH 01/02/2019 31.2  26.0 - 34.0 pg Final  . MCHC 01/02/2019 33.1  30.0 - 36.0 g/dL Final  . RDW 01/02/2019 14.8  11.5 - 15.5 % Final  . Platelets 01/02/2019 240  150 - 400 K/uL Final  . nRBC 01/02/2019 0.0  0.0 - 0.2 % Final   Performed at [facility], [place] [address], [place], [place] [address]  . Neutrophils Relative % 01/02/2019 PENDING  %  Incomplete  . Neutro Abs 01/02/2019 PENDING  1.7 - 7.7 K/uL Incomplete  . Band Neutrophils 01/02/2019 PENDING  % Incomplete  . Lymphocytes Relative 01/02/2019 PENDING  % Incomplete  . Lymphs Abs 01/02/2019 PENDING  0.7 - 4.0 K/uL Incomplete  . Monocytes Relative 01/02/2019 PENDING  % Incomplete  . Monocytes Absolute 01/02/2019 PENDING  0.1 - 1.0 K/uL Incomplete  . Eosinophils Relative 01/02/2019 PENDING  % Incomplete  . Eosinophils Absolute 01/02/2019 PENDING  0.0 - 0.5 K/uL Incomplete  . Basophils Relative 01/02/2019 PENDING  % Incomplete  . Basophils Absolute 01/02/2019 PENDING  0.0 - 0.1 K/uL Incomplete  . WBC Morphology 01/02/2019 PENDING   Incomplete  . RBC Morphology 01/02/2019 PENDING   Incomplete  . Smear Review 01/02/2019 PENDING   Incomplete  . Other 01/02/2019 PENDING  % Incomplete  . nRBC 01/02/2019 PENDING  0 /100 WBC Incomplete  . Metamyelocytes Relative 01/02/2019 PENDING  % Incomplete  . Myelocytes 01/02/2019 PENDING  % Incomplete  . Promyelocytes Relative 01/02/2019 PENDING  % Incomplete  . Blasts 01/02/2019 PENDING  % Incomplete    (this displays the last labs from the last 3 days)  No results found for: TOTALPROTELP, ALBUMINELP, A1GS, A2GS, BETS, BETA2SER, GAMS, MSPIKE, SPEI (this displays SPEP labs)  No results found for: KPAFRELGTCHN, LAMBDASER, KAPLAMBRATIO (kappa/lambda light chains)  No results found for: HGBA, HGBA2QUANT, HGBFQUANT, HGBSQUAN (Hemoglobinopathy evaluation)   No results found for: LDH  No results found for: IRON, TIBC, IRONPCTSAT (Iron and TIBC)  No  results found for: FERRITIN  Urinalysis    Component Value Date/Time   COLORURINE YELLOW 01/27/2008 0235   APPEARANCEUR CLOUDY (A) 01/27/2008 0235   LABSPEC 1.027 01/27/2008 0235   PHURINE 7.0 01/27/2008 0235   GLUCOSEU NEGATIVE 01/27/2008 0235   HGBUR NEGATIVE 01/27/2008 0235   BILIRUBINUR NEGATIVE 01/27/2008 0235   KETONESUR NEGATIVE 01/27/2008 0235   PROTEINUR NEGATIVE 01/27/2008 0235   UROBILINOGEN 0.2 01/27/2008 0235   NITRITE NEGATIVE 01/27/2008 0235   LEUKOCYTESUR SMALL (A) 01/27/2008 0235     STUDIES: No results found.   ELIGIBLE FOR AVAILABLE RESEARCH PROTOCOL: no   ASSESSMENT: 48 y.o. [facility] woman status post left breast upper outer quadrant biopsy 10/02/2018 for a clinical T2N0, stage IB invasive lobular breast cancer, grade not stated, estrogen and progesterone receptor positive, HER-2 not amplified, with an MIB-1 of less than 1%  (1) status post left mastectomy and sentinel lymph node sampling 10/18/2018 for a pT3 pN2, stage IIA invasive lobular breast cancer, grade 2, with close but negative margins  (a) 5 of 9 lymph nodes removed had micrometastatic deposits of tumor  (b) immediate expander placement  (2) chemotherapy will consist of doxorubicin and cyclophosphamide in dose dense fashion x4, starting 11/22/2018, to be followed by weekly paclitaxel x12  (3) adjuvant radiation to follow  (4) antiestrogens to start at the completion of local treatment  (a) consider goserelin/anastrozole   PLAN: Toneka is doing well today.  Her labs are stable and she will proceed with her final dose of adjuvant chemotherapy with doxorubicin and cyclophosphamide.  I  I reviewed with her the upcoming treatment with Paclitaxel.  She knows to bring bags to ice her hands and feet to avoid peripheral neuropathy.  We also reviewed how the adverse effects are different.   I let Na know that at this point in her chemotherapy it is not uncommon for her to have increasing  fatigue.  That very well may have been the cause in her increased tiredness  when taking the Valtrex.  I recommended that she take it to avoid ulcers, but also that it is her choice on which side effect was worse.   [name] and I reviewed her menstrual cycle.  She has estrogen positive breast cancer, and is having menstrual cycles.  I think that her spotting is due to having her IUD changed, in addition to hormone changes caused by the chemotherapy.  She denies any heavy menstrual bleeding.  Since her breast cancer is estrogen positive and Dr. Jana Hakim is considering Goserelin in the future, she and I discussed the possibility of starting it during her chemotherapy treatment.  I gave her information to read about this, so it can be discussed further at her appointment with him in 2 weeks.    Devanee will return in 2 weeks for labs, f/u, and to start weekly Paclitaxel.  She knows to call for any other issue that may develop before the next visit.  A total of (30) minutes of face-to-face time was spent with this patient with greater than 50% of that time in counseling and care-coordination.   Wilber Bihari, NP 01/02/19 10:50 AM Medical Oncology and Hematology [facility] [address] [place], [place] [address] Tel. [phone]    Fax. [phone]

## 2019-01-02 NOTE — Patient Instructions (Signed)
[facility] Discharge Instructions for Patients Receiving Chemotherapy  Today you received the following chemotherapy agents Adriamycin, Cytoxan  To help prevent nausea and vomiting after your treatment, we encourage you to take your nausea medication as directed by MD   If you develop nausea and vomiting that is not controlled by your nausea medication, call the clinic.   BELOW ARE SYMPTOMS THAT SHOULD BE REPORTED IMMEDIATELY:  *FEVER GREATER THAN 100.5 F  *CHILLS WITH OR WITHOUT FEVER  NAUSEA AND VOMITING THAT IS NOT CONTROLLED WITH YOUR NAUSEA MEDICATION  *UNUSUAL SHORTNESS OF BREATH  *UNUSUAL BRUISING OR BLEEDING  TENDERNESS IN MOUTH AND THROAT WITH OR WITHOUT PRESENCE OF ULCERS  *URINARY PROBLEMS  *BOWEL PROBLEMS  UNUSUAL RASH Items with * indicate a potential emergency and should be followed up as soon as possible.  Feel free to call the clinic should you have any questions or concerns. The clinic phone number is [phone].  Please show the [facility] at check-in to the Emergency Department and triage nurse.

## 2019-01-03 ENCOUNTER — Encounter: Payer: Self-pay | Admitting: *Deleted

## 2019-01-08 ENCOUNTER — Encounter: Payer: Self-pay | Admitting: General Practice

## 2019-01-08 NOTE — Progress Notes (Signed)
[facility] Spiritual Care Note  LVM offer of follow-up support, encouraging callback.   [place], [place], [facility] Pager [phone] Voicemail [phone]

## 2019-01-15 ENCOUNTER — Inpatient Hospital Stay: Payer: 59

## 2019-01-15 ENCOUNTER — Inpatient Hospital Stay (HOSPITAL_BASED_OUTPATIENT_CLINIC_OR_DEPARTMENT_OTHER): Payer: 59 | Admitting: Oncology

## 2019-01-15 ENCOUNTER — Other Ambulatory Visit: Payer: 59

## 2019-01-15 ENCOUNTER — Other Ambulatory Visit: Payer: Self-pay

## 2019-01-15 VITALS — BP 110/63 | HR 80 | Resp 18

## 2019-01-15 VITALS — BP 110/66 | HR 84 | Temp 99.1°F | Resp 18 | Ht 66.0 in | Wt 141.9 lb

## 2019-01-15 DIAGNOSIS — C773 Secondary and unspecified malignant neoplasm of axilla and upper limb lymph nodes: Secondary | ICD-10-CM

## 2019-01-15 DIAGNOSIS — Z170 Estrogen receptor positive status [ER+]: Secondary | ICD-10-CM

## 2019-01-15 DIAGNOSIS — R5383 Other fatigue: Secondary | ICD-10-CM

## 2019-01-15 DIAGNOSIS — C50412 Malignant neoplasm of upper-outer quadrant of left female breast: Secondary | ICD-10-CM

## 2019-01-15 DIAGNOSIS — N764 Abscess of vulva: Secondary | ICD-10-CM

## 2019-01-15 DIAGNOSIS — Z5111 Encounter for antineoplastic chemotherapy: Secondary | ICD-10-CM | POA: Diagnosis not present

## 2019-01-15 DIAGNOSIS — Z9012 Acquired absence of left breast and nipple: Secondary | ICD-10-CM

## 2019-01-15 LAB — COMPREHENSIVE METABOLIC PANEL
ALT: 13 U/L (ref 0–44)
AST: 12 U/L — ABNORMAL LOW (ref 15–41)
Albumin: 3.5 g/dL (ref 3.5–5.0)
Alkaline Phosphatase: 56 U/L (ref 38–126)
Anion gap: 8 (ref 5–15)
BUN: 9 mg/dL (ref 6–20)
CO2: 25 mmol/L (ref 22–32)
Calcium: 8.3 mg/dL — ABNORMAL LOW (ref 8.9–10.3)
Chloride: 109 mmol/L (ref 98–111)
Creatinine, Ser: 0.72 mg/dL (ref 0.44–1.00)
GFR calc Af Amer: >60 mL/min (ref >60)
GFR calc non Af Amer: >60 mL/min (ref >60)
Glucose, Bld: 104 mg/dL — ABNORMAL HIGH (ref 70–99)
Potassium: 3.9 mmol/L (ref 3.5–5.1)
Sodium: 142 mmol/L (ref 135–145)
Total Bilirubin: <0.2 mg/dL — ABNORMAL LOW (ref 0.3–1.2)
Total Protein: 6 g/dL — ABNORMAL LOW (ref 6.5–8.1)

## 2019-01-15 LAB — CBC WITH DIFFERENTIAL/PLATELET
Abs Immature Granulocytes: 0.53 10*3/uL — ABNORMAL HIGH (ref 0.00–0.07)
Basophils Absolute: 0.1 10*3/uL (ref 0.0–0.1)
Basophils Relative: 1 %
Eosinophils Absolute: 0 10*3/uL (ref 0.0–0.5)
Eosinophils Relative: 0 %
HCT: 32.1 % — ABNORMAL LOW (ref 36.0–46.0)
Hemoglobin: 10.3 g/dL — ABNORMAL LOW (ref 12.0–15.0)
Immature Granulocytes: 8 %
Lymphocytes Relative: 11 %
Lymphs Abs: 0.7 10*3/uL (ref 0.7–4.0)
MCH: 30.8 pg (ref 26.0–34.0)
MCHC: 32.1 g/dL (ref 30.0–36.0)
MCV: 96.1 fL (ref 80.0–100.0)
Monocytes Absolute: 0.8 10*3/uL (ref 0.1–1.0)
Monocytes Relative: 12 %
Neutro Abs: 4.7 10*3/uL (ref 1.7–7.7)
Neutrophils Relative %: 68 %
Platelets: 207 10*3/uL (ref 150–400)
RBC: 3.34 MIL/uL — ABNORMAL LOW (ref 3.87–5.11)
RDW: 16.2 % — ABNORMAL HIGH (ref 11.5–15.5)
WBC: 6.8 10*3/uL (ref 4.0–10.5)
nRBC: 0 % (ref 0.0–0.2)

## 2019-01-15 MED ORDER — FLUCONAZOLE 100 MG PO TABS: 100.0000 mg | ORAL_TABLET | Freq: Every day | ORAL | 0 refills | Status: DC

## 2019-01-15 MED ORDER — FAMOTIDINE IN NACL 20-0.9 MG/50ML-% IV SOLN
INTRAVENOUS | Status: AC
  Filled 2019-01-15: qty 50

## 2019-01-15 MED ORDER — SODIUM CHLORIDE 0.9% FLUSH
10.0000 mL | INTRAVENOUS | Status: DC | PRN
  Administered 2019-01-15: 10 mL
  Filled 2019-01-15: qty 10

## 2019-01-15 MED ORDER — SODIUM CHLORIDE 0.9 % IV SOLN
Freq: Once | INTRAVENOUS | Status: AC
  Administered 2019-01-15: 11:00:00 via INTRAVENOUS
  Filled 2019-01-15: qty 250

## 2019-01-15 MED ORDER — FAMOTIDINE IN NACL 20-0.9 MG/50ML-% IV SOLN
20.0000 mg | Freq: Once | INTRAVENOUS | Status: AC
  Administered 2019-01-15: 11:00:00 20 mg via INTRAVENOUS

## 2019-01-15 MED ORDER — SODIUM CHLORIDE 0.9 % IV SOLN
20.0000 mg | Freq: Once | INTRAVENOUS | Status: AC
  Administered 2019-01-15: 20 mg via INTRAVENOUS
  Filled 2019-01-15: qty 20

## 2019-01-15 MED ORDER — SODIUM CHLORIDE 0.9 % IV SOLN
80.0000 mg/m2 | Freq: Once | INTRAVENOUS | Status: AC
  Administered 2019-01-15: 138 mg via INTRAVENOUS
  Filled 2019-01-15: qty 23

## 2019-01-15 MED ORDER — DIPHENHYDRAMINE HCL 50 MG/ML IJ SOLN
25.0000 mg | Freq: Once | INTRAMUSCULAR | Status: AC
  Administered 2019-01-15: 25 mg via INTRAVENOUS

## 2019-01-15 MED ORDER — DIPHENHYDRAMINE HCL 50 MG/ML IJ SOLN
INTRAMUSCULAR | Status: AC
  Filled 2019-01-15: qty 1

## 2019-01-15 MED ORDER — HEPARIN SOD (PORK) LOCK FLUSH 100 UNIT/ML IV SOLN
500.0000 [IU] | Freq: Once | INTRAVENOUS | Status: AC | PRN
  Administered 2019-01-15: 500 [IU]
  Filled 2019-01-15: qty 5

## 2019-01-15 MED ORDER — CEPHALEXIN 500 MG PO CAPS: 500.0000 mg | ORAL_CAPSULE | Freq: Two times a day (BID) | ORAL | 0 refills | Status: DC

## 2019-01-15 NOTE — Progress Notes (Signed)
[place]  [phone] [phone]    ID: [name] DOB: 10-17-1970  MR#: [id number]  [id number]#:[id number]  Patient Care Team: Orpah Melter, MD as PCP - General (Family Medicine) Mauro Kaufmann, RN as Oncology Nurse Navigator Rockwell Germany, RN as Oncology Nurse Navigator Jovita Kussmaul, MD as Consulting Physician (General Surgery) Stepahnie Campo, Virgie Dad, MD as Consulting Physician (Oncology) Kyung Rudd, MD as Consulting Physician (Radiation Oncology) Aloha Gell, MD as Consulting Physician (Obstetrics and Gynecology) [name], Loel Lofty, DO as Attending Physician (Plastic Surgery) Chauncey Cruel, MD OTHER MD:   CHIEF COMPLAINT: Estrogen receptor positive breast cancer  CURRENT TREATMENT: Adjuvant chemotherapy   INTERVAL HISTORY: [name] returns today for follow-up and treatment of her estrogen receptor positive breast cancer.  She completed the first part of her adjuvant chemotherapy consisting of doxorubicin and cyclophosphamide in dose dense fashion x4, with her last cycle received on 01/02/2019. She is starting the second part today, namely paclitaxel weekly x12.-- today is day 1 cycle 1 of paclitaxel. She does note that she has been feeling more fatigued. Her stomach has been more upset and her stool has been looser.   Since her last visit here, she has not undergone any additional studies.    She will see Dr. [name] in mid 01/2019.    REVIEW OF SYSTEMS: [name] has recently developed a boil on her labia, with some blood and pus discharge. Her nail beds also turned puffy and red, which she has been treating with an anti-fungal cream. The patient denies unusual headaches, visual changes, nausea, vomiting, or dizziness. There has been no unusual cough, phlegm production, or pleurisy. This been no change in bowel or bladder habits. The patient denies unexplained fatigue or unexplained weight loss, bleeding, rash, or  fever. A detailed review of systems was otherwise noncontributory.   Wt Readings from Last 3 Encounters:  01/15/19 141 lb 14.4 oz (64.4 kg)  01/02/19 140 lb 12.8 oz (63.9 kg)  12/25/18 138 lb 6.4 oz (62.8 kg)     HISTORY OF CURRENT ILLNESS: From the original intake note:  "[name]" presented with left nipple retraction for 1 week with retroareolar mass in the upper outer quadrant. She underwent bilateral diagnostic mammography with CAD and left breast ultrasonography at [facility] on 09/25/2018 showing: 7 mm oval duct in the left breast; no significant abnormalities in the left axilla. She also underwent additional imaging with left breast digital diagnostic mammogram on 10/02/2018 showing: new 0.4 cm cluster of grouped heterogeneous calcifications in the left breast.   Accordingly on 10/02/2018 she proceeded to biopsy of the left breast area in question. 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 Prognostic indicators significant for: estrogen receptor, 90% positive, with moderate staining intensity and progesterone receptor, 100% positive, with strong staining intensity. Proliferation marker Ki67 at <1%. HER2 negative (1+).   The patient's subsequent history is as detailed above.   PAST MEDICAL HISTORY: Past Medical History:  Diagnosis Date   Cancer ([place]) 10/18/2018   left breast cancer    PAST SURGICAL HISTORY: Past Surgical History:  Procedure Laterality Date   BREAST RECONSTRUCTION WITH PLACEMENT OF TISSUE EXPANDER AND FLEX HD (ACELLULAR HYDRATED DERMIS) Left 10/18/2018   Procedure: IMMEDIATEVBREAST RECONSTRUCTION WITH PLACEMENT OF TISSUE EXPANDER AND FLEX  HD (ACELLULAR HYDRATED DERMIS);  Surgeon: Wallace Going, DO;  Location: [place];  Service: Plastics;  Laterality: Left;   MASTECTOMY     MASTECTOMY W/ SENTINEL NODE BIOPSY Left 10/18/2018   Procedure: LEFT MASTECTOMY WITH SENTINEL LYMPH NODE BIOPSY;  Surgeon: Jovita Kussmaul, MD;  Location: [place];  Service: General;  Laterality: Left;   PORTACATH PLACEMENT Right 11/02/2018   Procedure: INSERTION PORT-A-CATH WITH ULTRASOUND;  Surgeon: Jovita Kussmaul, MD;  Location: [place];  Service: General;  Laterality: Right;    FAMILY HISTORY Family History  Problem Relation Age of Onset   Breast cancer Neg Hx    Ovarian cancer Neg Hx    As of March 2020, patient's father is living and healthy at age 37. Patient's mother is also living and healthy at age 22. The patient denies a family hx of breast or ovarian cancer. She has 1 sister.  GYNECOLOGIC HISTORY:  No LMP recorded. (Menstrual status: IUD). Menarche: 48 years old Age at first live birth: 48 years old [place] 2 LMP in 2010 Contraceptive: Mirena IUD has been replaced by a ParaGard IUD as of March 2020 She used birth control pills between ages 79 to 32 w/o event HRT n/a  Hysterectomy? no BSO? 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  She has a PhD in Probation officer.  Husband [name] is an Art gallery manager. She lives at home with her husband and 3 children. She has two children, [name] age 35 and [name] age 27. Husband [name] has one daughter, Lucina Betty age 38, who lives with them.    ADVANCED DIRECTIVES: In the absence of any documentation to the contrary her husband [name] is her HCPOA.   HEALTH MAINTENANCE: Social History   Tobacco Use   Smoking status: Never Smoker   Smokeless tobacco: Never Used  Substance Use Topics   Alcohol use: Yes    Alcohol/week: 3.0 standard drinks    Types: 3 Standard drinks or equivalent per week    Comment: social   Drug use: Never     Colonoscopy: never  done  PAP: 08/2017  Bone density: never done   No Known Allergies  Current Outpatient Medications  Medication Sig Dispense Refill   cephALEXin (KEFLEX) 500 MG capsule Take 1 capsule (500 mg total) by mouth 2 (two) times daily. 10 capsule 0   dexamethasone (DECADRON) 4 MG tablet Take 2 tablets by mouth once a day on the day after chemotherapy and then take 2 tablets two times a day for 2 days. Take with food. 30 tablet 1   diazepam (VALIUM) 2 MG tablet Take 1 tablet (2 mg total) by mouth every 6 (six) hours as needed for anxiety. 30 tablet 0   fluconazole (DIFLUCAN) 100 MG tablet Take 1 tablet (100 mg total) by mouth daily. 5 tablet 0   HYDROcodone-acetaminophen (NORCO) 5-325 MG tablet Take 1-2 tablets by mouth every 6 (six) hours as needed for moderate pain. 10 tablet 0   lidocaine-prilocaine (EMLA) cream Apply to affected area once 30 g 3   loratadine (CLARITIN) 10 MG tablet Take 1 tablet (10 mg total) by mouth daily. 90 tablet 0   LORazepam (ATIVAN) 0.5 MG tablet Take 1 tablet (0.5 mg total) by mouth at bedtime as needed (Nausea or vomiting). 30 tablet 0   prochlorperazine (COMPAZINE) 10 MG tablet Take 1 tablet (10 mg total) by mouth every 6 (  hours as needed (Nausea or vomiting). 30 tablet 1  °• valACYclovir (VALTREX) 1000 MG tablet Take 1 tablet (1,000 mg total) by mouth daily. 10 tablet 0  ° °No current facility-administered medications for this visit.   ° ° °OBJECTIVE: Young white woman in no acute distress °Vitals:  ° 01/15/19 1010  °BP: 110/66  °Pulse: 84  °Resp: 18  °Temp: 99.1 °F (37.3 °C)  °SpO2: 100%  °   Body mass index is 22.9 kg/m².   °Wt Readings from Last 3 Encounters:  °01/15/19 141 lb 14.4 oz (64.4 kg)  °01/02/19 140 lb 12.8 oz (63.9 kg)  °12/25/18 138 lb 6.4 oz (62.8 kg)  ° ° °  ECOG FS:1 - Symptomatic but completely ambulatory ° °Sclerae unicteric, EOMs intact °Wearing a mask °Lungs no rales or rhonchi °Heart regular rate and rhythm °Abd soft, nontender, positive  bowel sounds °MSK no focal spinal tenderness, no upper extremity lymphedema °Neuro: nonfocal, well oriented, appropriate affect °Breasts: The left breast is status post mastectomy with expander in place; the cosmetic result is good. ° ° ° ° ° °LAB RESULTS: ° °CMP  °   °Component Value Date/Time  ° NA 142 01/15/2019 0931  ° K 3.9 01/15/2019 0931  ° CL 109 01/15/2019 0931  ° CO2 25 01/15/2019 0931  ° GLUCOSE 104 (H) 01/15/2019 0931  ° BUN 9 01/15/2019 0931  ° CREATININE 0.72 01/15/2019 0931  ° CREATININE 0.75 12/01/2018 0808  ° CALCIUM 8.3 (L) 01/15/2019 0931  ° PROT 6.0 (L) 01/15/2019 0931  ° ALBUMIN 3.5 01/15/2019 0931  ° AST 12 (L) 01/15/2019 0931  ° AST 13 (L) 12/01/2018 0808  ° ALT 13 01/15/2019 0931  ° ALT 21 12/01/2018 0808  ° ALKPHOS 56 01/15/2019 0931  ° BILITOT <0.2 (L) 01/15/2019 0931  ° BILITOT <0.2 (L) 12/01/2018 0808  ° GFRNONAA >60 01/15/2019 0931  ° GFRNONAA >60 12/01/2018 0808  ° GFRAA >60 01/15/2019 0931  ° GFRAA >60 12/01/2018 0808  ° ° °No results found for: TOTALPROTELP, ALBUMINELP, A1GS, A2GS, BETS, BETA2SER, GAMS, MSPIKE, SPEI ° °No results found for: KPAFRELGTCHN, LAMBDASER, KAPLAMBRATIO ° °Lab Results  °Component Value Date  ° WBC 6.8 01/15/2019  ° NEUTROABS 4.7 01/15/2019  ° HGB 10.3 (L) 01/15/2019  ° HCT 32.1 (L) 01/15/2019  ° MCV 96.1 01/15/2019  ° PLT 207 01/15/2019  ° ° °@LASTCHEMISTRY@ ° °No results found for: LABCA2 ° °No components found for: LABCAN125 ° °No results for input(s): INR in the last 168 hours. ° °No results found for: LABCA2 ° °No results found for: CAN199 ° °No results found for: CAN125 ° °No results found for: CAN153 ° °No results found for: CA2729 ° °No components found for: HGQUANT ° °No results found for: CEA1 / No results found for: CEA1  ° °No results found for: AFPTUMOR ° °No results found for: CHROMOGRNA ° °No results found for: PSA1 ° °Appointment on 01/15/2019  °Component Date Value Ref Range Status  °• Sodium 01/15/2019 142  135 - 145 mmol/L Final  °• Potassium  01/15/2019 3.9  3.5 - 5.1 mmol/L Final  °• Chloride 01/15/2019 109  98 - 111 mmol/L Final  °• CO2 01/15/2019 25  22 - 32 mmol/L Final  °• Glucose, Bld 01/15/2019 104* 70 - 99 mg/dL Final  °• BUN 01/15/2019 9  6 - 20 mg/dL Final  °• Creatinine, Ser 01/15/2019 0.72  0.44 - 1.00 mg/dL Final  °• Calcium 01/15/2019 8.3* 8.9 - 10.3 mg/dL Final  °• Total Protein 01/15/2019   6.0* 6.5 - 8.1 g/dL Final  °• Albumin 01/15/2019 3.5  3.5 - 5.0 g/dL Final  °• AST 01/15/2019 12* 15 - 41 U/L Final  °• ALT 01/15/2019 13  0 - 44 U/L Final  °• Alkaline Phosphatase 01/15/2019 56  38 - 126 U/L Final  °• Total Bilirubin 01/15/2019 <0.2* 0.3 - 1.2 mg/dL Final  °• GFR calc non Af Amer 01/15/2019 >60  >60 mL/min Final  °• GFR calc Af Amer 01/15/2019 >60  >60 mL/min Final  °• Anion gap 01/15/2019 8  5 - 15 Final  ° Performed at [facility], [address] [place], [place] [address]  °• WBC 01/15/2019 6.8  4.0 - 10.5 K/uL Final  °• RBC 01/15/2019 3.34* 3.87 - 5.11 MIL/uL Final  °• Hemoglobin 01/15/2019 10.3* 12.0 - 15.0 g/dL Final  °• HCT 01/15/2019 32.1* 36.0 - 46.0 % Final  °• MCV 01/15/2019 96.1  80.0 - 100.0 fL Final  °• MCH 01/15/2019 30.8  26.0 - 34.0 pg Final  °• MCHC 01/15/2019 32.1  30.0 - 36.0 g/dL Final  °• RDW 01/15/2019 16.2* 11.5 - 15.5 % Final  °• Platelets 01/15/2019 207  150 - 400 K/uL Final  °• nRBC 01/15/2019 0.0  0.0 - 0.2 % Final  °• Neutrophils Relative % 01/15/2019 68  % Final  °• Neutro Abs 01/15/2019 4.7  1.7 - 7.7 K/uL Final  °• Lymphocytes Relative 01/15/2019 11  % Final  °• Lymphs Abs 01/15/2019 0.7  0.7 - 4.0 K/uL Final  °• Monocytes Relative 01/15/2019 12  % Final  °• Monocytes Absolute 01/15/2019 0.8  0.1 - 1.0 K/uL Final  °• Eosinophils Relative 01/15/2019 0  % Final  °• Eosinophils Absolute 01/15/2019 0.0  0.0 - 0.5 K/uL Final  °• Basophils Relative 01/15/2019 1  % Final  °• Basophils Absolute 01/15/2019 0.1  0.0 - 0.1 K/uL Final  °• Immature Granulocytes 01/15/2019 8  % Final  °  Increased IG's, likely caused by Bone Marrow Colony Stimulating Factor received within 30 days.  °• Abs Immature Granulocytes 01/15/2019 0.53* 0.00 - 0.07 K/uL Final  ° Performed at [facility], [address] [place], [place] [address]  °  °(this displays the last labs from the last 3 days) ° °No results found for: TOTALPROTELP, ALBUMINELP, A1GS, A2GS, BETS, BETA2SER, GAMS, MSPIKE, SPEI °(this displays SPEP labs) ° °No results found for: KPAFRELGTCHN, LAMBDASER, KAPLAMBRATIO °(kappa/lambda light chains) ° °No results found for: HGBA, HGBA2QUANT, HGBFQUANT, HGBSQUAN °(Hemoglobinopathy evaluation)  ° °No results found for: LDH ° °No results found for: IRON, TIBC, IRONPCTSAT °(Iron and TIBC) ° °No results found for: FERRITIN ° °Urinalysis °   °Component Value Date/Time  ° COLORURINE YELLOW 01/27/2008 0235  ° APPEARANCEUR CLOUDY (A) 01/27/2008 0235  ° LABSPEC 1.027 01/27/2008 0235  ° PHURINE 7.0 01/27/2008 0235  ° GLUCOSEU NEGATIVE 01/27/2008 0235  ° HGBUR NEGATIVE 01/27/2008 0235  ° BILIRUBINUR NEGATIVE 01/27/2008 0235  ° KETONESUR NEGATIVE 01/27/2008 0235  ° PROTEINUR NEGATIVE 01/27/2008 0235  ° UROBILINOGEN 0.2 01/27/2008 0235  ° NITRITE NEGATIVE 01/27/2008 0235  ° LEUKOCYTESUR SMALL (A) 01/27/2008 0235  ° ° ° °STUDIES: °No results found.  ° °ELIGIBLE FOR AVAILABLE RESEARCH PROTOCOL: no ° ° °ASSESSMENT: 48 y.o. [place] woman status post left breast upper outer quadrant biopsy 10/02/2018 for a clinical T2N0, stage IB invasive lobular breast cancer, grade not stated, estrogen and progesterone receptor positive, HER-2 not amplified, with an MIB-1 of less than 1% ° °(  1) status post left mastectomy and sentinel lymph node sampling 10/18/2018 for a pT3 pN2, stage IIA invasive lobular breast cancer, grade 2, with close but negative margins  (a) 5 of 9 lymph nodes removed had micrometastatic deposits of tumor  (b) immediate expander placement  (2) chemotherapy will consist of doxorubicin and  cyclophosphamide in dose dense fashion x4, starting 11/22/2018, followed by weekly paclitaxel x12 starting 01/15/2019  (3) adjuvant radiation to follow  (4) antiestrogens to start at the completion of local treatment  (a) consider goserelin/anastrozole   PLAN: [name] has completed the more intensive part of her chemotherapy.  She is now ready to start the weekly paclitaxel.  She has a good understanding of the possible toxicities side effects and complications of this agent.  It is a fair possibility that she may lose her eyebrows and eyelashes from the earlier chemo.  She understands that they will grow back if they do fall off.  She is getting some darkening of the nails.  This may progress but eventually she will get completely normal-looking nails  We reviewed the anti-emetics, which will be a lot simpler now that she does not have to take Decadron.  For the labial boil I wrote her cephalexin and Diflucan for 5 days.  This problem should be resolved by the time she sees me again next week  She knows to call for any other issue that may develop before the next visit.  Jacon Whetzel, Virgie Dad, MD  01/15/19 10:50 AM Medical Oncology and Hematology [facility] [address] [place], [place] [address] Tel. [phone]    Fax. [phone]  I, Jacqualyn Posey am acting as a Education administrator for Chauncey Cruel, MD.   I, Lurline Del MD, have reviewed the above documentation for accuracy and completeness, and I agree with the above.

## 2019-01-15 NOTE — Patient Instructions (Signed)
[facility] Discharge Instructions for Patients Receiving Chemotherapy  Today you received the following chemotherapy agents: Taxol.  To help prevent nausea and vomiting after your treatment, we encourage you to take your nausea medication as directed.   If you develop nausea and vomiting that is not controlled by your nausea medication, call the clinic.   BELOW ARE SYMPTOMS THAT SHOULD BE REPORTED IMMEDIATELY:  *FEVER GREATER THAN 100.5 F  *CHILLS WITH OR WITHOUT FEVER  NAUSEA AND VOMITING THAT IS NOT CONTROLLED WITH YOUR NAUSEA MEDICATION  *UNUSUAL SHORTNESS OF BREATH  *UNUSUAL BRUISING OR BLEEDING  TENDERNESS IN MOUTH AND THROAT WITH OR WITHOUT PRESENCE OF ULCERS  *URINARY PROBLEMS  *BOWEL PROBLEMS  UNUSUAL RASH Items with * indicate a potential emergency and should be followed up as soon as possible.  Feel free to call the clinic should you have any questions or concerns. The clinic phone number is [phone].  Please show the CHEMO ALERT CARD at check-in to the Emergency Department and triage nurse.  Paclitaxel injection What is this medicine? PACLITAXEL (PAK li TAX el) is a chemotherapy drug. It targets fast dividing cells, like cancer cells, and causes these cells to die. This medicine is used to treat ovarian cancer, breast cancer, lung cancer, Kaposi's sarcoma, and other cancers. This medicine may be used for other purposes; ask your health care provider or pharmacist if you have questions. COMMON BRAND NAME(S): Onxol, Taxol What should I tell my health care provider before I take this medicine? They need to know if you have any of these conditions:  history of irregular heartbeat  liver disease  low blood counts, like low white cell, platelet, or red cell counts  lung or breathing disease, like asthma  tingling of the fingers or toes, or other nerve disorder  an unusual or allergic reaction to paclitaxel, alcohol, polyoxyethylated castor  oil, other chemotherapy, other medicines, foods, dyes, or preservatives  pregnant or trying to get pregnant  breast-feeding How should I use this medicine? This drug is given as an infusion into a vein. It is administered in a hospital or clinic by a specially trained health care professional. Talk to your pediatrician regarding the use of this medicine in children. Special care may be needed. Overdosage: If you think you have taken too much of this medicine contact a poison control center or emergency room at once. NOTE: This medicine is only for you. Do not share this medicine with others. What if I miss a dose? It is important not to miss your dose. Call your doctor or health care professional if you are unable to keep an appointment. What may interact with this medicine? Do not take this medicine with any of the following medications:  disulfiram  metronidazole This medicine may also interact with the following medications:  antiviral medicines for hepatitis, HIV or AIDS  certain antibiotics like erythromycin and clarithromycin  certain medicines for fungal infections like ketoconazole and itraconazole  certain medicines for seizures like carbamazepine, phenobarbital, phenytoin  gemfibrozil  nefazodone  rifampin  St. John's wort This list may not describe all possible interactions. Give your health care provider a list of all the medicines, herbs, non-prescription drugs, or dietary supplements you use. Also tell them if you smoke, drink alcohol, or use illegal drugs. Some items may interact with your medicine. What should I watch for while using this medicine? Your condition will be monitored carefully while you are receiving this medicine. You will need important blood work   done while you are taking this medicine. This medicine can cause serious allergic reactions. To reduce your risk you will need to take other medicine(s) before treatment with this medicine. If you  experience allergic reactions like skin rash, itching or hives, swelling of the face, lips, or tongue, tell your doctor or health care professional right away. In some cases, you may be given additional medicines to help with side effects. Follow all directions for their use. This drug may make you feel generally unwell. This is not uncommon, as chemotherapy can affect healthy cells as well as cancer cells. Report any side effects. Continue your course of treatment even though you feel ill unless your doctor tells you to stop. Call your doctor or health care professional for advice if you get a fever, chills or sore throat, or other symptoms of a cold or flu. Do not treat yourself. This drug decreases your body's ability to fight infections. Try to avoid being around people who are sick. This medicine may increase your risk to bruise or bleed. Call your doctor or health care professional if you notice any unusual bleeding. Be careful brushing and flossing your teeth or using a toothpick because you may get an infection or bleed more easily. If you have any dental work done, tell your dentist you are receiving this medicine. Avoid taking products that contain aspirin, acetaminophen, ibuprofen, naproxen, or ketoprofen unless instructed by your doctor. These medicines may hide a fever. Do not become pregnant while taking this medicine. Women should inform their doctor if they wish to become pregnant or think they might be pregnant. There is a potential for serious side effects to an unborn child. Talk to your health care professional or pharmacist for more information. Do not breast-feed an infant while taking this medicine. Men are advised not to father a child while receiving this medicine. This product may contain alcohol. Ask your pharmacist or healthcare provider if this medicine contains alcohol. Be sure to tell all healthcare providers you are taking this medicine. Certain medicines, like metronidazole  and disulfiram, can cause an unpleasant reaction when taken with alcohol. The reaction includes flushing, headache, nausea, vomiting, sweating, and increased thirst. The reaction can last from 30 minutes to several hours. What side effects may I notice from receiving this medicine? Side effects that you should report to your doctor or health care professional as soon as possible:  allergic reactions like skin rash, itching or hives, swelling of the face, lips, or tongue  breathing problems  changes in vision  fast, irregular heartbeat  high or low blood pressure  mouth sores  pain, tingling, numbness in the hands or feet  signs of decreased platelets or bleeding - bruising, pinpoint red spots on the skin, black, tarry stools, blood in the urine  signs of decreased red blood cells - unusually weak or tired, feeling faint or lightheaded, falls  signs of infection - fever or chills, cough, sore throat, pain or difficulty passing urine  signs and symptoms of liver injury like dark yellow or brown urine; general ill feeling or flu-like symptoms; light-colored stools; loss of appetite; nausea; right upper belly pain; unusually weak or tired; yellowing of the eyes or skin  swelling of the ankles, feet, hands  unusually slow heartbeat Side effects that usually do not require medical attention (report to your doctor or health care professional if they continue or are bothersome):  diarrhea  hair loss  loss of appetite  muscle or joint pain    nausea, vomiting  pain, redness, or irritation at site where injected  tiredness This list may not describe all possible side effects. Call your doctor for medical advice about side effects. You may report side effects to FDA at 1-800-FDA-1088. Where should I keep my medicine? This drug is given in a hospital or clinic and will not be stored at home. NOTE: This sheet is a summary. It may not cover all possible information. If you have  questions about this medicine, talk to your doctor, pharmacist, or health care provider.  2020 Elsevier/Gold Standard (2017-03-08 13:14:55)  

## 2019-01-16 ENCOUNTER — Telehealth: Payer: Self-pay | Admitting: *Deleted

## 2019-01-16 NOTE — Telephone Encounter (Signed)
Called pt to assess needs and tolerance to 1st taxol tx. Relate doing well, no rxn, kept hands and feet in ice bags to help prevent neuropathy. Denies needs or questions. Encourage pt to inform if she has any symptoms of neuropathy. Received verbal understanding.

## 2019-01-18 ENCOUNTER — Telehealth: Payer: Self-pay | Admitting: *Deleted

## 2019-01-18 NOTE — Telephone Encounter (Signed)
This RN spoke with pt as well as to nurse with Dr Aloha Gell ( gyn ) per visit regarding vulva abscess.  Per visit with Dr Pamala Hurry- recommendation is to change antibiotic from Keflex to Bactrim and for pt to use diflucan daily.  Dr Pamala Hurry wanted MD to know of above recommendation as well as concern that plan could delay chemotherapy scheduled for 01/22/2019.  Informed pt and nurse above is appropriate and proceed as recommended by Dr Pamala Hurry. Antibiotic therapy is not contraindicated with current chemo regimen.  Pt is scheduled to see MD as well.  No further needs at this time.

## 2019-01-21 NOTE — Progress Notes (Signed)
[place]  [phone]) [phone] [phone]) [phone]    ID: Subrina Vecchiarelli Covalt DOB: April 11, 1971  MR#: [id number]  DPO#:[id number]  Patient Care Team: Orpah Melter, MD as PCP - General (Family Medicine) Mauro Kaufmann, RN as Oncology Nurse Navigator Rockwell Germany, RN as Oncology Nurse Navigator Jovita Kussmaul, MD as Consulting Physician (General Surgery) Saniah Schroeter, Virgie Dad, MD as Consulting Physician (Oncology) Kyung Rudd, MD as Consulting Physician (Radiation Oncology) Aloha Gell, MD as Consulting Physician (Obstetrics and Gynecology) [name], Loel Lofty, DO as Attending Physician (Plastic Surgery) Chauncey Cruel, MD OTHER MD:   CHIEF COMPLAINT: Estrogen receptor positive breast cancer  CURRENT TREATMENT: Adjuvant chemotherapy   INTERVAL HISTORY: Karleigh returns today for follow-up and treatment of her estrogen receptor positive breast cancer.  She continues on her adjuvant chemotherapy consisting of weekly paclitaxel x12. Today is day 1 cycle 2. She notes that she felt "groggy in the head," but that cleared up after a day. She has had normal bowel movements. She has had no mouth sores or yeast in her mouth.   Since her last visit here, she has not undergone any additional studies.     REVIEW OF SYSTEMS: [place] stayed in for the 4th of July amid pandemic concerns, but she grilled with her husband and watched fireworks.  We had started her on cephalexin and Diflucan for her vaginal issue, but when she saw Dr. Valentino Saxon she was switched to Septra.  Unfortunately she notes that she had an allergic reaction to Septra, including a rash across the face.  She is now off antibiotics and will be seeing Dr. Valentino Saxon again tomorrow.  The patient denies unusual headaches, visual changes, nausea, vomiting, or dizziness. There has been no unusual cough, phlegm production, or pleurisy. This been no change in bowel or bladder habits. The patient denies  unexplained weight loss, bleeding, or fever. A detailed review of systems was otherwise noncontributory.    HISTORY OF CURRENT ILLNESS: From the original intake note:  "Laysa" presented with left nipple retraction for 1 week with retroareolar mass in the upper outer quadrant. She underwent bilateral diagnostic mammography with CAD and left breast ultrasonography at [facility] on 09/25/2018 showing: 7 mm oval duct in the left breast; no significant abnormalities in the left axilla. She also underwent additional imaging with left breast digital diagnostic mammogram on 10/02/2018 showing: new 0.4 cm cluster of grouped heterogeneous calcifications in the left breast.   Accordingly on 10/02/2018 she proceeded to biopsy of the left breast area in question. The pathology [id number]) from this procedure showed: mammary carcinoma in situ at the 3 o'clock mass, with possible focal microinvasion; invasive and in situ mammary carcinoma at the 12 o'clock mass, immunostain for E-cadherin is negative in the tumor cells, consistent with a lobular phenotype. Prognostic indicators significant for: estrogen receptor, 90% positive, with moderate staining intensity and progesterone receptor, 100% positive, with strong staining intensity. Proliferation marker Ki67 at <1%. HER2 negative (1+).   The patient's subsequent history is as detailed above.   PAST MEDICAL HISTORY: Past Medical History:  Diagnosis Date   Cancer ([place]) 10/18/2018   left breast cancer    PAST SURGICAL HISTORY: Past Surgical History:  Procedure Laterality Date   BREAST RECONSTRUCTION WITH PLACEMENT OF TISSUE EXPANDER AND FLEX HD (ACELLULAR HYDRATED DERMIS) Left 10/18/2018   Procedure: IMMEDIATEVBREAST RECONSTRUCTION WITH PLACEMENT OF TISSUE EXPANDER AND FLEX HD (ACELLULAR HYDRATED DERMIS);  Surgeon: Wallace Going, DO;  Location: [place];  Service: Plastics;  Laterality: Left;   MASTECTOMY     MASTECTOMY W/ SENTINEL NODE  BIOPSY Left 10/18/2018   Procedure: LEFT MASTECTOMY WITH SENTINEL LYMPH NODE BIOPSY;  Surgeon: Jovita Kussmaul, MD;  Location: [place];  Service: General;  Laterality: Left;   PORTACATH PLACEMENT Right 11/02/2018   Procedure: INSERTION PORT-A-CATH WITH ULTRASOUND;  Surgeon: Jovita Kussmaul, MD;  Location: [place];  Service: General;  Laterality: Right;    FAMILY HISTORY Family History  Problem Relation Age of Onset   Breast cancer Neg Hx    Ovarian cancer Neg Hx    As of March 2020, patient's father is living and healthy at age 58. Patient's mother is also living and healthy at age 42. The patient denies a family hx of breast or ovarian cancer. She has 1 sister.  GYNECOLOGIC HISTORY:  No LMP recorded. (Menstrual status: IUD). Menarche: 48 years old Age at first live birth: 48 years old [place] 2 LMP in 2010 Contraceptive: Mirena IUD has been replaced by a ParaGard IUD as of March 2020 She used birth control pills between ages 48 to 64 w/o event w/o event HRT n/a  Hysterectomy? no BSO? 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  She has a PhD in Probation officer.  Husband [name] is an Art gallery manager. She lives at home with her husband and 3 children. She has two children, [name] age 48 and [name] age 48. Husband [name] has one daughter, Caty Tessler age 48 who lives with them.    ADVANCED DIRECTIVES: In the absence of any documentation to the contrary her husband [name] is her HCPOA.   HEALTH MAINTENANCE: Social History   Tobacco Use   Smoking status: Never Smoker   Smokeless tobacco: Never Used  Substance Use Topics   Alcohol use: Yes    Alcohol/week: 3.0 standard drinks    Types: 3 Standard drinks or equivalent per week    Comment: social   Drug use: Never     Colonoscopy: never done  PAP: 08/2017  Bone density: never done   No Known Allergies  Current Outpatient Medications    Medication Sig Dispense Refill   diazepam (VALIUM) 2 MG tablet Take 1 tablet (2 mg total) by mouth every 6 (six) hours as needed for anxiety. 30 tablet 0   lidocaine-prilocaine (EMLA) cream Apply to affected area once 30 g 3   loratadine (CLARITIN) 10 MG tablet Take 1 tablet (10 mg total) by mouth daily. 90 tablet 0   LORazepam (ATIVAN) 0.5 MG tablet Take 1 tablet (0.5 mg total) by mouth at bedtime as needed (Nausea or vomiting). 30 tablet 0   prochlorperazine (COMPAZINE) 10 MG tablet Take 1 tablet (10 mg total) by mouth every 6 (six) hours as needed (Nausea or vomiting). 30 tablet 1   valACYclovir (VALTREX) 1000 MG tablet Take 1 tablet (1,000 mg total) by mouth daily. 10 tablet 0   No current facility-administered medications for this visit.     OBJECTIVE: Young white woman who appears well Vitals:   01/22/19 0909  BP: (!) 104/56  Pulse: 85  Resp: 18  Temp: 98.3 F (36.8 C)  SpO2: 100%     Body mass index is 22.73 kg/m.   Wt Readings from Last 3 Encounters:  01/22/19 140 lb 12.8 oz (63.9 kg)  01/15/19 141 lb 14.4 oz (64.4 kg)  01/02/19 140 lb 12.8 oz (63.9 kg)      ECOG FS:1 -  Symptomatic but completely ambulatory  Sclerae unicteric, pupils round and equal Wearing a mask No cervical or supraclavicular adenopathy Lungs no rales or rhonchi Heart regular rate and rhythm Abd soft, nontender, positive bowel sounds MSK no focal spinal tenderness, no upper extremity lymphedema Neuro: nonfocal, well oriented, appropriate affect Breasts: The left breast is status post mastectomy with expander in place.  The cosmetic result is very good.  There is no evidence of residual or recurrent disease.      LAB RESULTS:  CMP     Component Value Date/Time   NA 142 01/15/2019 0931   K 3.9 01/15/2019 0931   CL 109 01/15/2019 0931   CO2 25 01/15/2019 0931   GLUCOSE 104 (H) 01/15/2019 0931   BUN 9 01/15/2019 0931   CREATININE 0.72 01/15/2019 0931   CREATININE 0.75 12/01/2018  0808   CALCIUM 8.3 (L) 01/15/2019 0931   PROT 6.0 (L) 01/15/2019 0931   ALBUMIN 3.5 01/15/2019 0931   AST 12 (L) 01/15/2019 0931   AST 13 (L) 12/01/2018 0808   ALT 13 01/15/2019 0931   ALT 21 12/01/2018 0808   ALKPHOS 56 01/15/2019 0931   BILITOT <0.2 (L) 01/15/2019 0931   BILITOT <0.2 (L) 12/01/2018 0808   GFRNONAA >60 01/15/2019 0931   GFRNONAA >60 12/01/2018 0808   GFRAA >60 01/15/2019 0931   GFRAA >60 12/01/2018 0808    No results found for: TOTALPROTELP, ALBUMINELP, A1GS, A2GS, BETS, BETA2SER, GAMS, MSPIKE, SPEI  No results found for: KPAFRELGTCHN, LAMBDASER, [facility]  Lab Results  Component Value Date   WBC 4.2 01/22/2019   NEUTROABS 2.7 01/22/2019   HGB 10.6 (L) 01/22/2019   HCT 32.7 (L) 01/22/2019   MCV 96.5 01/22/2019   PLT 391 01/22/2019    _0 @  No results found for: LABCA2  No components found for: QHUTML465  No results for input(s): INR in the last 168 hours.  No results found for: LABCA2  No results found for: KPT465  No results found for: KCL275  No results found for: TZG017  No results found for: CA2729  No components found for: HGQUANT  No results found for: CEA1 / No results found for: CEA1   No results found for: AFPTUMOR  No results found for: CHROMOGRNA  No results found for: PSA1  Appointment on 01/22/2019  Component Date Value Ref Range Status   WBC 01/22/2019 4.2  4.0 - 10.5 K/uL Final   RBC 01/22/2019 3.39* 3.87 - 5.11 MIL/uL Final   Hemoglobin 01/22/2019 10.6* 12.0 - 15.0 g/dL Final   HCT 01/22/2019 32.7* 36.0 - 46.0 % Final   MCV 01/22/2019 96.5  80.0 - 100.0 fL Final   MCH 01/22/2019 31.3  26.0 - 34.0 pg Final   MCHC 01/22/2019 32.4  30.0 - 36.0 g/dL Final   RDW 01/22/2019 16.3* 11.5 - 15.5 % Final   Platelets 01/22/2019 391  150 - 400 K/uL Final   nRBC 01/22/2019 0.0  0.0 - 0.2 % Final   Neutrophils Relative % 01/22/2019 63  % Final   Neutro Abs 01/22/2019 2.7  1.7 - 7.7 K/uL Final    Lymphocytes Relative 01/22/2019 20  % Final   Lymphs Abs 01/22/2019 0.8  0.7 - 4.0 K/uL Final   Monocytes Relative 01/22/2019 12  % Final   Monocytes Absolute 01/22/2019 0.5  0.1 - 1.0 K/uL Final   Eosinophils Relative 01/22/2019 1  % Final   Eosinophils Absolute 01/22/2019 0.1  0.0 - 0.5 K/uL Final   Basophils Relative 01/22/2019  2  % Final   Basophils Absolute 01/22/2019 0.1  0.0 - 0.1 K/uL Final   Immature Granulocytes 01/22/2019 2  % Final   Abs Immature Granulocytes 01/22/2019 0.07  0.00 - 0.07 K/uL Final   Performed at [facility], [address] [place], [place] [address]    (this displays the last labs from the last 3 days)  No results found for: TOTALPROTELP, ALBUMINELP, A1GS, A2GS, BETS, BETA2SER, GAMS, MSPIKE, SPEI (this displays SPEP labs)  No results found for: KPAFRELGTCHN, LAMBDASER, KAPLAMBRATIO (kappa/lambda light chains)  No results found for: HGBA, HGBA2QUANT, HGBFQUANT, HGBSQUAN (Hemoglobinopathy evaluation)   No results found for: LDH  No results found for: IRON, TIBC, IRONPCTSAT (Iron and TIBC)  No results found for: FERRITIN  Urinalysis    Component Value Date/Time   COLORURINE YELLOW 01/27/2008 0235   APPEARANCEUR CLOUDY (A) 01/27/2008 0235   LABSPEC 1.027 01/27/2008 0235   PHURINE 7.0 01/27/2008 0235   GLUCOSEU NEGATIVE 01/27/2008 0235   HGBUR NEGATIVE 01/27/2008 0235   BILIRUBINUR NEGATIVE 01/27/2008 0235   KETONESUR NEGATIVE 01/27/2008 0235   PROTEINUR NEGATIVE 01/27/2008 0235   UROBILINOGEN 0.2 01/27/2008 0235   NITRITE NEGATIVE 01/27/2008 0235   LEUKOCYTESUR SMALL (A) 01/27/2008 0235     STUDIES: No results found.   ELIGIBLE FOR AVAILABLE RESEARCH PROTOCOL: no   ASSESSMENT: 48 y.o. [facility] woman status post left breast upper outer quadrant biopsy 10/02/2018 for a clinical T2N0, stage IB invasive lobular breast cancer, grade not stated, estrogen and progesterone receptor positive, HER-2 not  amplified, with an MIB-1 of less than 1%  (1) status post left mastectomy and sentinel lymph node sampling 10/18/2018 for a pT3 pN2, stage IIA invasive lobular breast cancer, grade 2, with close but negative margins  (a) 5 of 9 lymph nodes removed had micrometastatic deposits of tumor  (b) immediate expander placement  (2) chemotherapy consisting of doxorubicin and cyclophosphamide in dose dense fashion x4 started 11/22/2018, completed 01/02/2019 followed by weekly paclitaxel x12 starting 01/15/2019  (3) adjuvant radiation to follow  (4) antiestrogens to start at the completion of local treatment  (a) consider goserelin/anastrozole   PLAN: Shirleen will proceed to her second of 12 planned weekly paclitaxel treatments today.  Generally she is doing well with this chemotherapy and we are dropping the steroid dose to 10 mg today and 4 mg next week.  The vaginal "boil" is improved clinically but not resolved.  Unfortunately she developed a rash with Septra.  She will see Dr. Valentino Saxon tomorrow for further evaluation and treatment  At this point we are going to start seeing [name] every other week.  Once she gets to the eighth week of Taxol we will start seeing her weekly.  I again reviewed neuropathy concerns with her today.  It is of concern that she called [place] twice on 01/19/2019 and never received a reply.  I have alerted our office manager regarding this    Lonny Eisen, Virgie Dad, MD  01/22/19 9:24 AM Medical Oncology and Hematology [facility] [address] [place], [place] [address] Tel. [phone]    Fax. [phone]  I, Jacqualyn Posey am acting as a Education administrator for Chauncey Cruel, MD.   I, Lurline Del MD, have reviewed the above documentation for accuracy and completeness, and I agree with the above.

## 2019-01-22 ENCOUNTER — Inpatient Hospital Stay: Payer: 59 | Attending: Oncology

## 2019-01-22 ENCOUNTER — Other Ambulatory Visit: Payer: Self-pay

## 2019-01-22 ENCOUNTER — Inpatient Hospital Stay: Payer: 59

## 2019-01-22 ENCOUNTER — Encounter: Payer: Self-pay | Admitting: *Deleted

## 2019-01-22 ENCOUNTER — Inpatient Hospital Stay (HOSPITAL_BASED_OUTPATIENT_CLINIC_OR_DEPARTMENT_OTHER): Payer: 59 | Admitting: Oncology

## 2019-01-22 VITALS — BP 104/56 | HR 85 | Temp 98.3°F | Resp 18 | Ht 66.0 in | Wt 140.8 lb

## 2019-01-22 DIAGNOSIS — N764 Abscess of vulva: Secondary | ICD-10-CM | POA: Diagnosis not present

## 2019-01-22 DIAGNOSIS — R21 Rash and other nonspecific skin eruption: Secondary | ICD-10-CM

## 2019-01-22 DIAGNOSIS — Z9012 Acquired absence of left breast and nipple: Secondary | ICD-10-CM

## 2019-01-22 DIAGNOSIS — C773 Secondary and unspecified malignant neoplasm of axilla and upper limb lymph nodes: Secondary | ICD-10-CM | POA: Insufficient documentation

## 2019-01-22 DIAGNOSIS — C50412 Malignant neoplasm of upper-outer quadrant of left female breast: Secondary | ICD-10-CM

## 2019-01-22 DIAGNOSIS — Z170 Estrogen receptor positive status [ER+]: Secondary | ICD-10-CM | POA: Diagnosis not present

## 2019-01-22 DIAGNOSIS — Z5111 Encounter for antineoplastic chemotherapy: Secondary | ICD-10-CM | POA: Insufficient documentation

## 2019-01-22 DIAGNOSIS — Z95828 Presence of other vascular implants and grafts: Secondary | ICD-10-CM

## 2019-01-22 LAB — COMPREHENSIVE METABOLIC PANEL
ALT: 55 U/L — ABNORMAL HIGH (ref 0–44)
AST: 27 U/L (ref 15–41)
Albumin: 3.5 g/dL (ref 3.5–5.0)
Alkaline Phosphatase: 43 U/L (ref 38–126)
Anion gap: 9 (ref 5–15)
BUN: 15 mg/dL (ref 6–20)
CO2: 23 mmol/L (ref 22–32)
Calcium: 8.6 mg/dL — ABNORMAL LOW (ref 8.9–10.3)
Chloride: 109 mmol/L (ref 98–111)
Creatinine, Ser: 0.72 mg/dL (ref 0.44–1.00)
GFR calc Af Amer: >60 mL/min (ref >60)
GFR calc non Af Amer: >60 mL/min (ref >60)
Glucose, Bld: 85 mg/dL (ref 70–99)
Potassium: 4.2 mmol/L (ref 3.5–5.1)
Sodium: 141 mmol/L (ref 135–145)
Total Bilirubin: <0.2 mg/dL — ABNORMAL LOW (ref 0.3–1.2)
Total Protein: 6.2 g/dL — ABNORMAL LOW (ref 6.5–8.1)

## 2019-01-22 LAB — CBC WITH DIFFERENTIAL/PLATELET
Abs Immature Granulocytes: 0.07 10*3/uL (ref 0.00–0.07)
Basophils Absolute: 0.1 10*3/uL (ref 0.0–0.1)
Basophils Relative: 2 %
Eosinophils Absolute: 0.1 10*3/uL (ref 0.0–0.5)
Eosinophils Relative: 1 %
HCT: 32.7 % — ABNORMAL LOW (ref 36.0–46.0)
Hemoglobin: 10.6 g/dL — ABNORMAL LOW (ref 12.0–15.0)
Immature Granulocytes: 2 %
Lymphocytes Relative: 20 %
Lymphs Abs: 0.8 10*3/uL (ref 0.7–4.0)
MCH: 31.3 pg (ref 26.0–34.0)
MCHC: 32.4 g/dL (ref 30.0–36.0)
MCV: 96.5 fL (ref 80.0–100.0)
Monocytes Absolute: 0.5 10*3/uL (ref 0.1–1.0)
Monocytes Relative: 12 %
Neutro Abs: 2.7 10*3/uL (ref 1.7–7.7)
Neutrophils Relative %: 63 %
Platelets: 391 10*3/uL (ref 150–400)
RBC: 3.39 MIL/uL — ABNORMAL LOW (ref 3.87–5.11)
RDW: 16.3 % — ABNORMAL HIGH (ref 11.5–15.5)
WBC: 4.2 10*3/uL (ref 4.0–10.5)
nRBC: 0 % (ref 0.0–0.2)

## 2019-01-22 MED ORDER — SODIUM CHLORIDE 0.9 % IV SOLN: 10.0000 mg | Freq: Once | INTRAVENOUS | Status: DC

## 2019-01-22 MED ORDER — HEPARIN SOD (PORK) LOCK FLUSH 100 UNIT/ML IV SOLN
500.0000 [IU] | Freq: Once | INTRAVENOUS | Status: AC | PRN
  Administered 2019-01-22: 500 [IU]
  Filled 2019-01-22: qty 5

## 2019-01-22 MED ORDER — SODIUM CHLORIDE 0.9% FLUSH
10.0000 mL | INTRAVENOUS | Status: DC | PRN
  Administered 2019-01-22: 10 mL
  Filled 2019-01-22: qty 10

## 2019-01-22 MED ORDER — DEXAMETHASONE SODIUM PHOSPHATE 10 MG/ML IJ SOLN
10.0000 mg | Freq: Once | INTRAMUSCULAR | Status: AC
  Administered 2019-01-22: 10 mg via INTRAVENOUS

## 2019-01-22 MED ORDER — FAMOTIDINE IN NACL 20-0.9 MG/50ML-% IV SOLN
INTRAVENOUS | Status: AC
  Filled 2019-01-22: qty 50

## 2019-01-22 MED ORDER — SODIUM CHLORIDE 0.9 % IV SOLN
Freq: Once | INTRAVENOUS | Status: AC
  Administered 2019-01-22: 10:00:00 via INTRAVENOUS
  Filled 2019-01-22: qty 250

## 2019-01-22 MED ORDER — DIPHENHYDRAMINE HCL 50 MG/ML IJ SOLN
25.0000 mg | Freq: Once | INTRAMUSCULAR | Status: AC
  Administered 2019-01-22: 25 mg via INTRAVENOUS

## 2019-01-22 MED ORDER — DIPHENHYDRAMINE HCL 50 MG/ML IJ SOLN
INTRAMUSCULAR | Status: AC
  Filled 2019-01-22: qty 1

## 2019-01-22 MED ORDER — DEXAMETHASONE SODIUM PHOSPHATE 10 MG/ML IJ SOLN
INTRAMUSCULAR | Status: AC
  Filled 2019-01-22: qty 1

## 2019-01-22 MED ORDER — FAMOTIDINE IN NACL 20-0.9 MG/50ML-% IV SOLN
20.0000 mg | Freq: Once | INTRAVENOUS | Status: AC
  Administered 2019-01-22: 10:00:00 20 mg via INTRAVENOUS

## 2019-01-22 MED ORDER — SODIUM CHLORIDE 0.9% FLUSH
10.0000 mL | Freq: Once | INTRAVENOUS | Status: AC
  Administered 2019-01-22: 10 mL
  Filled 2019-01-22: qty 10

## 2019-01-22 MED ORDER — SODIUM CHLORIDE 0.9 % IV SOLN
80.0000 mg/m2 | Freq: Once | INTRAVENOUS | Status: AC
  Administered 2019-01-22: 138 mg via INTRAVENOUS
  Filled 2019-01-22: qty 23

## 2019-01-22 NOTE — Patient Instructions (Signed)
[facility] Discharge Instructions for Patients Receiving Chemotherapy  Today you received the following chemotherapy agents: Taxol.  To help prevent nausea and vomiting after your treatment, we encourage you to take your nausea medication as directed.   If you develop nausea and vomiting that is not controlled by your nausea medication, call the clinic.   BELOW ARE SYMPTOMS THAT SHOULD BE REPORTED IMMEDIATELY:  *FEVER GREATER THAN 100.5 F  *CHILLS WITH OR WITHOUT FEVER  NAUSEA AND VOMITING THAT IS NOT CONTROLLED WITH YOUR NAUSEA MEDICATION  *UNUSUAL SHORTNESS OF BREATH  *UNUSUAL BRUISING OR BLEEDING  TENDERNESS IN MOUTH AND THROAT WITH OR WITHOUT PRESENCE OF ULCERS  *URINARY PROBLEMS  *BOWEL PROBLEMS  UNUSUAL RASH Items with * indicate a potential emergency and should be followed up as soon as possible.  Feel free to call the clinic should you have any questions or concerns. The clinic phone number is [phone].  Please show the CHEMO ALERT CARD at check-in to the Emergency Department and triage nurse.  Paclitaxel injection What is this medicine? PACLITAXEL (PAK li TAX el) is a chemotherapy drug. It targets fast dividing cells, like cancer cells, and causes these cells to die. This medicine is used to treat ovarian cancer, breast cancer, lung cancer, Kaposi's sarcoma, and other cancers. This medicine may be used for other purposes; ask your health care provider or pharmacist if you have questions. COMMON BRAND NAME(S): Onxol, Taxol What should I tell my health care provider before I take this medicine? They need to know if you have any of these conditions:  history of irregular heartbeat  liver disease  low blood counts, like low white cell, platelet, or red cell counts  lung or breathing disease, like asthma  tingling of the fingers or toes, or other nerve disorder  an unusual or allergic reaction to paclitaxel, alcohol, polyoxyethylated castor  oil, other chemotherapy, other medicines, foods, dyes, or preservatives  pregnant or trying to get pregnant  breast-feeding How should I use this medicine? This drug is given as an infusion into a vein. It is administered in a hospital or clinic by a specially trained health care professional. Talk to your pediatrician regarding the use of this medicine in children. Special care may be needed. Overdosage: If you think you have taken too much of this medicine contact a poison control center or emergency room at once. NOTE: This medicine is only for you. Do not share this medicine with others. What if I miss a dose? It is important not to miss your dose. Call your doctor or health care professional if you are unable to keep an appointment. What may interact with this medicine? Do not take this medicine with any of the following medications:  disulfiram  metronidazole This medicine may also interact with the following medications:  antiviral medicines for hepatitis, HIV or AIDS  certain antibiotics like erythromycin and clarithromycin  certain medicines for fungal infections like ketoconazole and itraconazole  certain medicines for seizures like carbamazepine, phenobarbital, phenytoin  gemfibrozil  nefazodone  rifampin  St. John's wort This list may not describe all possible interactions. Give your health care provider a list of all the medicines, herbs, non-prescription drugs, or dietary supplements you use. Also tell them if you smoke, drink alcohol, or use illegal drugs. Some items may interact with your medicine. What should I watch for while using this medicine? Your condition will be monitored carefully while you are receiving this medicine. You will need important blood work   done while you are taking this medicine. This medicine can cause serious allergic reactions. To reduce your risk you will need to take other medicine(s) before treatment with this medicine. If you  experience allergic reactions like skin rash, itching or hives, swelling of the face, lips, or tongue, tell your doctor or health care professional right away. In some cases, you may be given additional medicines to help with side effects. Follow all directions for their use. This drug may make you feel generally unwell. This is not uncommon, as chemotherapy can affect healthy cells as well as cancer cells. Report any side effects. Continue your course of treatment even though you feel ill unless your doctor tells you to stop. Call your doctor or health care professional for advice if you get a fever, chills or sore throat, or other symptoms of a cold or flu. Do not treat yourself. This drug decreases your body's ability to fight infections. Try to avoid being around people who are sick. This medicine may increase your risk to bruise or bleed. Call your doctor or health care professional if you notice any unusual bleeding. Be careful brushing and flossing your teeth or using a toothpick because you may get an infection or bleed more easily. If you have any dental work done, tell your dentist you are receiving this medicine. Avoid taking products that contain aspirin, acetaminophen, ibuprofen, naproxen, or ketoprofen unless instructed by your doctor. These medicines may hide a fever. Do not become pregnant while taking this medicine. Women should inform their doctor if they wish to become pregnant or think they might be pregnant. There is a potential for serious side effects to an unborn child. Talk to your health care professional or pharmacist for more information. Do not breast-feed an infant while taking this medicine. Men are advised not to father a child while receiving this medicine. This product may contain alcohol. Ask your pharmacist or healthcare provider if this medicine contains alcohol. Be sure to tell all healthcare providers you are taking this medicine. Certain medicines, like metronidazole  and disulfiram, can cause an unpleasant reaction when taken with alcohol. The reaction includes flushing, headache, nausea, vomiting, sweating, and increased thirst. The reaction can last from 30 minutes to several hours. What side effects may I notice from receiving this medicine? Side effects that you should report to your doctor or health care professional as soon as possible:  allergic reactions like skin rash, itching or hives, swelling of the face, lips, or tongue  breathing problems  changes in vision  fast, irregular heartbeat  high or low blood pressure  mouth sores  pain, tingling, numbness in the hands or feet  signs of decreased platelets or bleeding - bruising, pinpoint red spots on the skin, black, tarry stools, blood in the urine  signs of decreased red blood cells - unusually weak or tired, feeling faint or lightheaded, falls  signs of infection - fever or chills, cough, sore throat, pain or difficulty passing urine  signs and symptoms of liver injury like dark yellow or brown urine; general ill feeling or flu-like symptoms; light-colored stools; loss of appetite; nausea; right upper belly pain; unusually weak or tired; yellowing of the eyes or skin  swelling of the ankles, feet, hands  unusually slow heartbeat Side effects that usually do not require medical attention (report to your doctor or health care professional if they continue or are bothersome):  diarrhea  hair loss  loss of appetite  muscle or joint pain    nausea, vomiting  pain, redness, or irritation at site where injected  tiredness This list may not describe all possible side effects. Call your doctor for medical advice about side effects. You may report side effects to FDA at 1-800-FDA-1088. Where should I keep my medicine? This drug is given in a hospital or clinic and will not be stored at home. NOTE: This sheet is a summary. It may not cover all possible information. If you have  questions about this medicine, talk to your doctor, pharmacist, or health care provider.  2020 Elsevier/Gold Standard (2017-03-08 13:14:55)  

## 2019-01-22 NOTE — Patient Instructions (Signed)

## 2019-01-25 ENCOUNTER — Telehealth: Payer: Self-pay | Admitting: Plastic Surgery

## 2019-01-25 NOTE — Telephone Encounter (Signed)

## 2019-01-26 ENCOUNTER — Ambulatory Visit: Payer: 59 | Admitting: Plastic Surgery

## 2019-01-26 ENCOUNTER — Other Ambulatory Visit: Payer: Self-pay

## 2019-01-26 ENCOUNTER — Encounter: Payer: Self-pay | Admitting: Surgical

## 2019-01-26 ENCOUNTER — Ambulatory Visit (INDEPENDENT_AMBULATORY_CARE_PROVIDER_SITE_OTHER): Payer: 59 | Admitting: Plastic Surgery

## 2019-01-26 VITALS — BP 103/70 | HR 97 | Temp 98.6°F | Ht 66.0 in | Wt 139.8 lb

## 2019-01-26 DIAGNOSIS — Z170 Estrogen receptor positive status [ER+]: Secondary | ICD-10-CM

## 2019-01-26 DIAGNOSIS — C50412 Malignant neoplasm of upper-outer quadrant of left female breast: Secondary | ICD-10-CM

## 2019-01-26 DIAGNOSIS — Z9012 Acquired absence of left breast and nipple: Secondary | ICD-10-CM

## 2019-01-26 NOTE — Progress Notes (Signed)
   Subjective:     Patient ID: [name], female    DOB: 04/30/1971, 48 y.o.   MRN: [id number]  Chief Complaint  Patient presents with  . Follow-up    1 mos  . Breast Problem  The patient is a 48 year old female here with her husband for follow-up on her left breast reconstruction.  She is still getting her chemotherapy and has a few weeks left.  She will then start radiation.  We are still talking about doing the exchange prior to the radiation.  She has good symmetry in regards to size at this time.  She has a little bit of laxity in the skin.  The incision is healing very nicely.  There is no sign of hematoma or seroma.  She is done extremely well with her fills.  She has 200 cc in a 300 cc expander.  HPI: The patient is a 48 y.o. yrs old female here for follow up on left breast reconstruction on 10/18/2018.    Review of Systems  Constitutional: Negative.   HENT: Negative.   Eyes: Negative.   Respiratory: Negative.   Cardiovascular: Negative.   Genitourinary: Negative.   Skin: Negative.        Objective:   Vital Signs BP 103/70 (BP Location: Right Arm, Patient Position: Sitting, Cuff Size: Normal)   Pulse 97   Temp 98.6 F (37 C) (Temporal)   Ht 5\' 6"  (1.676 m)   Wt 139 lb 12.8 oz (63.4 kg)   SpO2 99%   BMI 22.56 kg/m  Vital Signs and Nursing Note Reviewed  Physical Exam  Constitutional: She is well-developed, well-nourished, and in no distress.  HENT:  Head: Normocephalic and atraumatic.  Eyes: Pupils are equal, round, and reactive to light. EOM are normal.  Pulmonary/Chest: Effort normal.  Abdominal: Soft. She exhibits no distension.  Neurological: She is alert.  Skin: Skin is warm. No erythema.  Psychiatric: Affect and judgment normal.      Assessment/Plan:     ICD-10-CM   1. Malignant neoplasm of upper-outer quadrant of left breast in female, estrogen receptor positive ([place])  C50.412    Z17.0   2. Acquired absence of left breast  Z90.12     The patient will be finishing chemotherapy and next month.  After that she will start her radiation.  She would like to have her exchange prior to the radiation.  I think this is a great idea and we will make every effort to do that.  We will just need lab work before surgery.  She will also check to see if we can take the Port-A-Cath out at the same time.  We did agree on silicone implants today.  She has been thinking about a right breast augmentation and has decided against that.  I think that that is a good idea. 01/28/2019, 4:26 PM

## 2019-01-28 ENCOUNTER — Encounter: Payer: Self-pay | Admitting: Plastic Surgery

## 2019-01-29 ENCOUNTER — Inpatient Hospital Stay: Payer: 59

## 2019-01-29 ENCOUNTER — Other Ambulatory Visit: Payer: Self-pay

## 2019-01-29 VITALS — BP 106/59 | HR 80 | Temp 98.5°F | Resp 18

## 2019-01-29 DIAGNOSIS — C50412 Malignant neoplasm of upper-outer quadrant of left female breast: Secondary | ICD-10-CM

## 2019-01-29 DIAGNOSIS — Z5111 Encounter for antineoplastic chemotherapy: Secondary | ICD-10-CM | POA: Diagnosis not present

## 2019-01-29 DIAGNOSIS — Z95828 Presence of other vascular implants and grafts: Secondary | ICD-10-CM

## 2019-01-29 DIAGNOSIS — Z170 Estrogen receptor positive status [ER+]: Secondary | ICD-10-CM

## 2019-01-29 LAB — COMPREHENSIVE METABOLIC PANEL
ALT: 47 U/L — ABNORMAL HIGH (ref 0–44)
AST: 28 U/L (ref 15–41)
Albumin: 3.6 g/dL (ref 3.5–5.0)
Alkaline Phosphatase: 46 U/L (ref 38–126)
Anion gap: 9 (ref 5–15)
BUN: 12 mg/dL (ref 6–20)
CO2: 24 mmol/L (ref 22–32)
Calcium: 9.3 mg/dL (ref 8.9–10.3)
Chloride: 108 mmol/L (ref 98–111)
Creatinine, Ser: 0.73 mg/dL (ref 0.44–1.00)
GFR calc Af Amer: >60 mL/min (ref >60)
GFR calc non Af Amer: >60 mL/min (ref >60)
Glucose, Bld: 86 mg/dL (ref 70–99)
Potassium: 4.5 mmol/L (ref 3.5–5.1)
Sodium: 141 mmol/L (ref 135–145)
Total Bilirubin: 0.3 mg/dL (ref 0.3–1.2)
Total Protein: 6.5 g/dL (ref 6.5–8.1)

## 2019-01-29 LAB — CBC WITH DIFFERENTIAL/PLATELET
Abs Immature Granulocytes: 0.05 10*3/uL (ref 0.00–0.07)
Basophils Absolute: 0.1 10*3/uL (ref 0.0–0.1)
Basophils Relative: 1 %
Eosinophils Absolute: 0.1 10*3/uL (ref 0.0–0.5)
Eosinophils Relative: 3 %
HCT: 32.2 % — ABNORMAL LOW (ref 36.0–46.0)
Hemoglobin: 10.7 g/dL — ABNORMAL LOW (ref 12.0–15.0)
Immature Granulocytes: 1 %
Lymphocytes Relative: 15 %
Lymphs Abs: 0.7 10*3/uL (ref 0.7–4.0)
MCH: 31.9 pg (ref 26.0–34.0)
MCHC: 33.2 g/dL (ref 30.0–36.0)
MCV: 96.1 fL (ref 80.0–100.0)
Monocytes Absolute: 0.5 10*3/uL (ref 0.1–1.0)
Monocytes Relative: 12 %
Neutro Abs: 3.1 10*3/uL (ref 1.7–7.7)
Neutrophils Relative %: 68 %
Platelets: 353 10*3/uL (ref 150–400)
RBC: 3.35 MIL/uL — ABNORMAL LOW (ref 3.87–5.11)
RDW: 16.8 % — ABNORMAL HIGH (ref 11.5–15.5)
WBC: 4.5 10*3/uL (ref 4.0–10.5)
nRBC: 0 % (ref 0.0–0.2)

## 2019-01-29 MED ORDER — FAMOTIDINE IN NACL 20-0.9 MG/50ML-% IV SOLN
INTRAVENOUS | Status: AC
  Filled 2019-01-29: qty 50

## 2019-01-29 MED ORDER — SODIUM CHLORIDE 0.9 % IV SOLN
Freq: Once | INTRAVENOUS | Status: AC
  Administered 2019-01-29: 10:00:00 via INTRAVENOUS
  Filled 2019-01-29: qty 250

## 2019-01-29 MED ORDER — SODIUM CHLORIDE 0.9% FLUSH
10.0000 mL | INTRAVENOUS | Status: DC | PRN
  Administered 2019-01-29: 12:00:00 10 mL
  Filled 2019-01-29: qty 10

## 2019-01-29 MED ORDER — SODIUM CHLORIDE 0.9 % IV SOLN
80.0000 mg/m2 | Freq: Once | INTRAVENOUS | Status: AC
  Administered 2019-01-29: 138 mg via INTRAVENOUS
  Filled 2019-01-29: qty 23

## 2019-01-29 MED ORDER — FAMOTIDINE IN NACL 20-0.9 MG/50ML-% IV SOLN
20.0000 mg | Freq: Once | INTRAVENOUS | Status: AC
  Administered 2019-01-29: 20 mg via INTRAVENOUS

## 2019-01-29 MED ORDER — DEXAMETHASONE SODIUM PHOSPHATE 10 MG/ML IJ SOLN
4.0000 mg | Freq: Once | INTRAMUSCULAR | Status: AC
  Administered 2019-01-29: 4 mg via INTRAVENOUS

## 2019-01-29 MED ORDER — HEPARIN SOD (PORK) LOCK FLUSH 100 UNIT/ML IV SOLN
500.0000 [IU] | Freq: Once | INTRAVENOUS | Status: AC | PRN
  Administered 2019-01-29: 500 [IU]
  Filled 2019-01-29: qty 5

## 2019-01-29 MED ORDER — DIPHENHYDRAMINE HCL 50 MG/ML IJ SOLN
INTRAMUSCULAR | Status: AC
  Filled 2019-01-29: qty 1

## 2019-01-29 MED ORDER — DEXAMETHASONE SODIUM PHOSPHATE 10 MG/ML IJ SOLN
INTRAMUSCULAR | Status: AC
  Filled 2019-01-29: qty 1

## 2019-01-29 MED ORDER — SODIUM CHLORIDE 0.9% FLUSH
10.0000 mL | Freq: Once | INTRAVENOUS | Status: AC
  Administered 2019-01-29: 10 mL
  Filled 2019-01-29: qty 10

## 2019-01-29 MED ORDER — DIPHENHYDRAMINE HCL 50 MG/ML IJ SOLN
25.0000 mg | Freq: Once | INTRAMUSCULAR | Status: AC
  Administered 2019-01-29: 25 mg via INTRAVENOUS

## 2019-01-29 NOTE — Patient Instructions (Signed)

## 2019-01-29 NOTE — Patient Instructions (Signed)
[facility] Discharge Instructions for Patients Receiving Chemotherapy  Today you received the following chemotherapy agents Paclitaxel (TAXOL).  To help prevent nausea and vomiting after your treatment, we encourage you to take your nausea medication as prescribed.  If you develop nausea and vomiting that is not controlled by your nausea medication, call the clinic.   BELOW ARE SYMPTOMS THAT SHOULD BE REPORTED IMMEDIATELY:  *FEVER GREATER THAN 100.5 F  *CHILLS WITH OR WITHOUT FEVER  NAUSEA AND VOMITING THAT IS NOT CONTROLLED WITH YOUR NAUSEA MEDICATION  *UNUSUAL SHORTNESS OF BREATH  *UNUSUAL BRUISING OR BLEEDING  TENDERNESS IN MOUTH AND THROAT WITH OR WITHOUT PRESENCE OF ULCERS  *URINARY PROBLEMS  *BOWEL PROBLEMS  UNUSUAL RASH Items with * indicate a potential emergency and should be followed up as soon as possible.  Feel free to call the clinic should you have any questions or concerns. The clinic phone number is [phone].  Please show the CHEMO ALERT CARD at check-in to the Emergency Department and triage nurse.  Coronavirus (COVID-19) Are you at risk?  Are you at risk for the Coronavirus (COVID-19)?  To be considered HIGH RISK for Coronavirus (COVID-19), you have to meet the following criteria:  . Traveled to [place], [place], [place], [place] or [place]; or in the [place] to [place], [place], [place], or [place]; and have fever, cough, and shortness of breath within the last 2 weeks of travel OR . Been in close contact with a person diagnosed with COVID-19 within the last 2 weeks and have fever, cough, and shortness of breath . IF YOU DO NOT MEET THESE CRITERIA, YOU ARE CONSIDERED LOW RISK FOR COVID-19.  What to do if you are HIGH RISK for COVID-19?  . If you are having a medical emergency, call 911. . Seek medical care right away. Before you go to a doctor's office, urgent care or emergency department, call ahead and tell them about  your recent travel, contact with someone diagnosed with COVID-19, and your symptoms. You should receive instructions from your physician's office regarding next steps of care.  . When you arrive at healthcare provider, tell the healthcare staff immediately you have returned from visiting [place], [place], [place], [place] or [place]; or traveled in the [place] to [place], [place], [place], or [place]; in the last two weeks or you have been in close contact with a person diagnosed with COVID-19 in the last 2 weeks.   . Tell the health care staff about your symptoms: fever, cough and shortness of breath. . After you have been seen by a medical provider, you will be either: o Tested for (COVID-19) and discharged home on quarantine except to seek medical care if symptoms worsen, and asked to  - Stay home and avoid contact with others until you get your results (4-5 days)  - Avoid travel on public transportation if possible (such as bus, train, or airplane) or o Sent to the Emergency Department by EMS for evaluation, COVID-19 testing, and possible admission depending on your condition and test results.  What to do if you are LOW RISK for COVID-19?  Reduce your risk of any infection by using the same precautions used for avoiding the common cold or flu:  . Wash your hands often with soap and warm water for at least 20 seconds.  If soap and water are not readily available, use an alcohol-based hand sanitizer with at least 60% alcohol.  . If coughing or sneezing,   cover your mouth and nose by coughing or sneezing into the elbow areas of your shirt or coat, into a tissue or into your sleeve (not your hands). . Avoid shaking hands with others and consider head nods or verbal greetings only. . Avoid touching your eyes, nose, or mouth with unwashed hands.  . Avoid close contact with people who are sick. . Avoid places or events with large numbers of people in one location, like concerts or sporting  events. . Carefully consider travel plans you have or are making. . If you are planning any travel outside or inside the [place], visit the CDC's Travelers' Health webpage for the latest health notices. . If you have some symptoms but not all symptoms, continue to monitor at home and seek medical attention if your symptoms worsen. . If you are having a medical emergency, call 911.   ADDITIONAL HEALTHCARE OPTIONS FOR PATIENTS  [place] [place] [facility] / e-Visit: [email or web address]         [facility]: [phone]  [facility]: [phone]                   [facility]: [phone]     

## 2019-01-30 ENCOUNTER — Encounter: Payer: Self-pay | Admitting: *Deleted

## 2019-02-05 ENCOUNTER — Inpatient Hospital Stay (HOSPITAL_BASED_OUTPATIENT_CLINIC_OR_DEPARTMENT_OTHER): Payer: 59 | Admitting: Adult Health

## 2019-02-05 ENCOUNTER — Encounter: Payer: Self-pay | Admitting: Adult Health

## 2019-02-05 ENCOUNTER — Inpatient Hospital Stay: Payer: 59

## 2019-02-05 ENCOUNTER — Other Ambulatory Visit: Payer: Self-pay

## 2019-02-05 VITALS — BP 113/54 | HR 83 | Temp 98.9°F | Resp 18 | Ht 66.0 in | Wt 143.0 lb

## 2019-02-05 DIAGNOSIS — R5383 Other fatigue: Secondary | ICD-10-CM

## 2019-02-05 DIAGNOSIS — Z5111 Encounter for antineoplastic chemotherapy: Secondary | ICD-10-CM | POA: Diagnosis not present

## 2019-02-05 DIAGNOSIS — Z170 Estrogen receptor positive status [ER+]: Secondary | ICD-10-CM

## 2019-02-05 DIAGNOSIS — Z9012 Acquired absence of left breast and nipple: Secondary | ICD-10-CM

## 2019-02-05 DIAGNOSIS — C773 Secondary and unspecified malignant neoplasm of axilla and upper limb lymph nodes: Secondary | ICD-10-CM | POA: Diagnosis not present

## 2019-02-05 DIAGNOSIS — Z95828 Presence of other vascular implants and grafts: Secondary | ICD-10-CM

## 2019-02-05 DIAGNOSIS — C50412 Malignant neoplasm of upper-outer quadrant of left female breast: Secondary | ICD-10-CM | POA: Diagnosis not present

## 2019-02-05 LAB — COMPREHENSIVE METABOLIC PANEL
ALT: 40 U/L (ref 0–44)
AST: 23 U/L (ref 15–41)
Albumin: 3.6 g/dL (ref 3.5–5.0)
Alkaline Phosphatase: 49 U/L (ref 38–126)
Anion gap: 9 (ref 5–15)
BUN: 15 mg/dL (ref 6–20)
CO2: 24 mmol/L (ref 22–32)
Calcium: 8.6 mg/dL — ABNORMAL LOW (ref 8.9–10.3)
Chloride: 108 mmol/L (ref 98–111)
Creatinine, Ser: 0.76 mg/dL (ref 0.44–1.00)
GFR calc Af Amer: >60 mL/min (ref >60)
GFR calc non Af Amer: >60 mL/min (ref >60)
Glucose, Bld: 97 mg/dL (ref 70–99)
Potassium: 3.8 mmol/L (ref 3.5–5.1)
Sodium: 141 mmol/L (ref 135–145)
Total Bilirubin: 0.3 mg/dL (ref 0.3–1.2)
Total Protein: 6.2 g/dL — ABNORMAL LOW (ref 6.5–8.1)

## 2019-02-05 LAB — CBC WITH DIFFERENTIAL/PLATELET
Abs Immature Granulocytes: 0.03 10*3/uL (ref 0.00–0.07)
Basophils Absolute: 0 10*3/uL (ref 0.0–0.1)
Basophils Relative: 1 %
Eosinophils Absolute: 0.1 10*3/uL (ref 0.0–0.5)
Eosinophils Relative: 4 %
HCT: 33.2 % — ABNORMAL LOW (ref 36.0–46.0)
Hemoglobin: 10.9 g/dL — ABNORMAL LOW (ref 12.0–15.0)
Immature Granulocytes: 1 %
Lymphocytes Relative: 23 %
Lymphs Abs: 0.9 10*3/uL (ref 0.7–4.0)
MCH: 31.7 pg (ref 26.0–34.0)
MCHC: 32.8 g/dL (ref 30.0–36.0)
MCV: 96.5 fL (ref 80.0–100.0)
Monocytes Absolute: 0.4 10*3/uL (ref 0.1–1.0)
Monocytes Relative: 10 %
Neutro Abs: 2.4 10*3/uL (ref 1.7–7.7)
Neutrophils Relative %: 61 %
Platelets: 292 10*3/uL (ref 150–400)
RBC: 3.44 MIL/uL — ABNORMAL LOW (ref 3.87–5.11)
RDW: 16.5 % — ABNORMAL HIGH (ref 11.5–15.5)
WBC: 3.9 10*3/uL — ABNORMAL LOW (ref 4.0–10.5)
nRBC: 0 % (ref 0.0–0.2)

## 2019-02-05 MED ORDER — HEPARIN SOD (PORK) LOCK FLUSH 100 UNIT/ML IV SOLN
500.0000 [IU] | Freq: Once | INTRAVENOUS | Status: AC | PRN
  Administered 2019-02-05: 12:00:00 500 [IU]
  Filled 2019-02-05: qty 5

## 2019-02-05 MED ORDER — SODIUM CHLORIDE 0.9% FLUSH
10.0000 mL | Freq: Once | INTRAVENOUS | Status: AC
  Administered 2019-02-05: 10 mL
  Filled 2019-02-05: qty 10

## 2019-02-05 MED ORDER — SODIUM CHLORIDE 0.9% FLUSH
10.0000 mL | INTRAVENOUS | Status: DC | PRN
  Administered 2019-02-05: 12:00:00 10 mL
  Filled 2019-02-05: qty 10

## 2019-02-05 MED ORDER — DIPHENHYDRAMINE HCL 50 MG/ML IJ SOLN
INTRAMUSCULAR | Status: AC
  Filled 2019-02-05: qty 1

## 2019-02-05 MED ORDER — FAMOTIDINE IN NACL 20-0.9 MG/50ML-% IV SOLN
20.0000 mg | Freq: Once | INTRAVENOUS | Status: AC
  Administered 2019-02-05: 20 mg via INTRAVENOUS

## 2019-02-05 MED ORDER — DEXAMETHASONE SODIUM PHOSPHATE 10 MG/ML IJ SOLN
INTRAMUSCULAR | Status: AC
  Filled 2019-02-05: qty 1

## 2019-02-05 MED ORDER — SODIUM CHLORIDE 0.9 % IV SOLN
Freq: Once | INTRAVENOUS | Status: AC
  Administered 2019-02-05: 10:00:00 via INTRAVENOUS
  Filled 2019-02-05: qty 250

## 2019-02-05 MED ORDER — DIPHENHYDRAMINE HCL 50 MG/ML IJ SOLN
25.0000 mg | Freq: Once | INTRAMUSCULAR | Status: AC
  Administered 2019-02-05: 10:00:00 25 mg via INTRAVENOUS

## 2019-02-05 MED ORDER — FAMOTIDINE IN NACL 20-0.9 MG/50ML-% IV SOLN
INTRAVENOUS | Status: AC
  Filled 2019-02-05: qty 50

## 2019-02-05 MED ORDER — DEXAMETHASONE SODIUM PHOSPHATE 10 MG/ML IJ SOLN
4.0000 mg | Freq: Once | INTRAMUSCULAR | Status: AC
  Administered 2019-02-05: 4 mg via INTRAVENOUS

## 2019-02-05 MED ORDER — SODIUM CHLORIDE 0.9 % IV SOLN
80.0000 mg/m2 | Freq: Once | INTRAVENOUS | Status: AC
  Administered 2019-02-05: 11:00:00 138 mg via INTRAVENOUS
  Filled 2019-02-05: qty 23

## 2019-02-05 NOTE — Patient Instructions (Signed)
[facility] Discharge Instructions for Patients Receiving Chemotherapy  Today you received the following chemotherapy agents:  Taxol.  To help prevent nausea and vomiting after your treatment, we encourage you to take your nausea medication as directed.   If you develop nausea and vomiting that is not controlled by your nausea medication, call the clinic.   BELOW ARE SYMPTOMS THAT SHOULD BE REPORTED IMMEDIATELY:  *FEVER GREATER THAN 100.5 F  *CHILLS WITH OR WITHOUT FEVER  NAUSEA AND VOMITING THAT IS NOT CONTROLLED WITH YOUR NAUSEA MEDICATION  *UNUSUAL SHORTNESS OF BREATH  *UNUSUAL BRUISING OR BLEEDING  TENDERNESS IN MOUTH AND THROAT WITH OR WITHOUT PRESENCE OF ULCERS  *URINARY PROBLEMS  *BOWEL PROBLEMS  UNUSUAL RASH Items with * indicate a potential emergency and should be followed up as soon as possible.  Feel free to call the clinic should you have any questions or concerns. The clinic phone number is [phone].  Please show the CHEMO ALERT CARD at check-in to the Emergency Department and triage nurse.   

## 2019-02-05 NOTE — Progress Notes (Signed)
[place]  [phone] [phone]) [phone]    ID: Ginia Rudell Loomis DOB: 1970/09/01  MR#: [id number]  IBB#:[id number]  Patient Care Team: Orpah Melter, MD as PCP - General (Family Medicine) Mauro Kaufmann, RN as Oncology Nurse Navigator Rockwell Germany, RN as Oncology Nurse Navigator Jovita Kussmaul, MD as Consulting Physician (General Surgery) Magrinat, Virgie Dad, MD as Consulting Physician (Oncology) Kyung Rudd, MD as Consulting Physician (Radiation Oncology) Aloha Gell, MD as Consulting Physician (Obstetrics and Gynecology) Dillingham, Loel Lofty, DO as Attending Physician (Plastic Surgery) Scot Dock, NP OTHER MD:   CHIEF COMPLAINT: Estrogen receptor positive breast cancer  CURRENT TREATMENT: Adjuvant chemotherapy   INTERVAL HISTORY: Trisha returns today for follow-up and treatment of her estrogen receptor positive breast cancer.  She continues on her adjuvant chemotherapy consisting of weekly paclitaxel x12. Today is day 1 cycle 4.  She has no signs of peripheral neuropathy and is icing her fingertips and toes with every treatment.  REVIEW OF SYSTEMS: Zyniah is feeling well today.  She notes that she is still fatigued, but is happy that the fatigue is not as severe as it was with her initial four Doxorubicin and Cyclophosphamide treatments.  She continues to walk about 1-2 miles a day.  She denies any new issues such as fever, chills, chest pain, palpitations, cough, shortness of breath, bowel or bladder changes, nausea, vomiting, skin rash or lesion, or mucositis.  A detailed ROS was non contributory.  Sianne's surgeon would like to put her implants in a cuople of weeks folliwng Paclitaxel treatments prior to her receiving radiation.  She wants to know how to time this.    HISTORY OF CURRENT ILLNESS: From the original intake note:  "Leoda" presented with left nipple retraction for 1 week with retroareolar mass in the  upper outer quadrant. She underwent bilateral diagnostic mammography with CAD and left breast ultrason[facility] on 09/25/2018 showing: 7 mm oval duct in the left breast; no significant abnormalities in the left axilla. She also underwent additional imaging with left breast digital diagnostic mammogram on 10/02/2018 showing: new 0.4 cm cluster of grouped heterogeneous calcifications in the left breast.   Accordingly on 10/02/2018 she proceeded to biopsy of the left breast area in question. 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 Prognostic indicators significant for: estrogen receptor, 90% positive, with moderate staining intensity and progesterone receptor, 100% positive, with strong staining intensity. Proliferation marker Ki67 at <1%. HER2 negative (1+).   The patient's subsequent history is as detailed above.   PAST MEDICAL HISTORY: Past Medical History:  Diagnosis Date  . Cancer ([facility]) 10/18/2018   left breast cancer    PAST SURGICAL HISTORY: Past Surgical History:  Procedure Laterality Date  . BREAST RECONSTRUCTION WITH PLACEMENT OF TISSUE EXPANDER AND FLEX HD (ACELLULAR HYDRATED DERMIS) Left 10/18/2018   Procedure: IMMEDIATEVBREAST RECONSTRUCTION WITH PLACEMENT OF TISSUE EXPANDER AND FLEX HD (ACELLULAR HYDRATED DERMIS);  Surgeon: Wallace Going, DO;  Location: [facility];  Service: Plastics;  Laterality: Left;  Marland Kitchen MASTECTOMY    . MASTECTOMY W/ SENTINEL NODE BIOPSY Left 10/18/2018   Procedure: LEFT MASTECTOMY WITH SENTINEL LYMPH NODE BIOPSY;  Surgeon: Jovita Kussmaul, MD;  Location: [facility];  Service: General;  Laterality: Left;  .  PORTACATH PLACEMENT Right 11/02/2018   Procedure: INSERTION PORT-A-CATH  WITH ULTRASOUND;  Surgeon: Jovita Kussmaul, MD;  Location: [facility];  Service: General;  Laterality: Right;    FAMILY HISTORY Family History  Problem Relation Age of Onset  . Breast cancer Neg Hx   . Ovarian cancer Neg Hx    As of March 2020, patient's father is living and healthy at age 31. Patient's mother is also living and healthy at age 62. The patient denies a family hx of breast or ovarian cancer. She has 1 sister.  GYNECOLOGIC HISTORY:  No LMP recorded. (Menstrual status: IUD). Menarche: 48 years old Age at first live birth: 48 years old [place] 2 LMP in 2010 Contraceptive: Mirena IUD has been replaced by a ParaGard IUD as of March 2020 She used birth control pills between ages 32 to 25 w/o event HRT n/a  Hysterectomy? no BSO? 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  She has a PhD in Probation officer.  Husband [name] is an Art gallery manager. She lives at home with her husband and 3 children. She has two children, [name] age 75 and [name] age 71. Husband [name] has one daughter, Yancy Hascall age 16, who lives with them.    ADVANCED DIRECTIVES: In the absence of any documentation to the contrary her husband [name] is her HCPOA.   HEALTH MAINTENANCE: Social History   Tobacco Use  . Smoking status: Never Smoker  . Smokeless tobacco: Never Used  Substance Use Topics  . Alcohol use: Yes    Alcohol/week: 3.0 standard drinks    Types: 3 Standard drinks or equivalent per week    Comment: social  . Drug use: Never     Colonoscopy: never done  PAP: 08/2017  Bone density: never done   Allergies  Allergen Reactions  . Septra [Sulfamethoxazole-Trimethoprim]     Current Outpatient Medications  Medication Sig Dispense Refill  . diazepam (VALIUM) 2 MG tablet Take 1 tablet (2 mg total) by mouth every 6 (six) hours as needed for anxiety. 30 tablet 0  . lidocaine-prilocaine (EMLA) cream Apply to affected  area once 30 g 3  . loratadine (CLARITIN) 10 MG tablet Take 1 tablet (10 mg total) by mouth daily. 90 tablet 0  . LORazepam (ATIVAN) 0.5 MG tablet Take 1 tablet (0.5 mg total) by mouth at bedtime as needed (Nausea or vomiting). 30 tablet 0  . prochlorperazine (COMPAZINE) 10 MG tablet Take 1 tablet (10 mg total) by mouth every 6 (six) hours as needed (Nausea or vomiting). 30 tablet 1  . valACYclovir (VALTREX) 1000 MG tablet Take 1 tablet (1,000 mg total) by mouth daily. 10 tablet 0   No current facility-administered medications for this visit.     OBJECTIVE:  Vitals:   02/05/19 0859  BP: (!) 113/54  Pulse: 83  Resp: 18  Temp: 98.9 F (37.2 C)  SpO2: 100%     Body mass index is 23.08 kg/m.   Wt Readings from Last 3 Encounters:  02/05/19 143 lb (64.9 kg)  01/26/19 139 lb 12.8 oz (63.4 kg)  01/22/19 140 lb 12.8 oz (63.9 kg)  ECOG FS:1 - Symptomatic but completely ambulatory GENERAL: Patient is a well appearing female in no acute distress HEENT:  Sclerae anicteric.  Oropharynx clear and moist. No ulcerations or evidence of oropharyngeal candidiasis. Neck is supple.  NODES:  No cervical, supraclavicular, or axillary lymphadenopathy palpated.  BREAST EXAM:  Deferred.  LUNGS:  Clear to auscultation bilaterally.  No wheezes or rhonchi. HEART:  Regular rate and rhythm. No murmur appreciated. ABDOMEN:  Soft, nontender.  Positive, normoactive bowel sounds. No organomegaly palpated. MSK:  No focal spinal tenderness to palpation. Full range of motion bilaterally in the upper extremities. EXTREMITIES:  No peripheral edema.   SKIN:  Clear with no obvious rashes or skin changes. No nail dyscrasia. NEURO:  Nonfocal. Well oriented.  Appropriate affect.     LAB RESULTS:  CMP     Component Value Date/Time   NA 141 01/29/2019 0905   K 4.5 01/29/2019 0905   CL 108 01/29/2019 0905   CO2 24 01/29/2019 0905   GLUCOSE 86 01/29/2019 0905   BUN 12 01/29/2019 0905   CREATININE 0.73 01/29/2019  0905   CREATININE 0.75 12/01/2018 0808   CALCIUM 9.3 01/29/2019 0905   PROT 6.5 01/29/2019 0905   ALBUMIN 3.6 01/29/2019 0905   AST 28 01/29/2019 0905   AST 13 (L) 12/01/2018 0808   ALT 47 (H) 01/29/2019 0905   ALT 21 12/01/2018 0808   ALKPHOS 46 01/29/2019 0905   BILITOT 0.3 01/29/2019 0905   BILITOT <0.2 (L) 12/01/2018 0808   GFRNONAA >60 01/29/2019 0905   GFRNONAA >60 12/01/2018 0808   GFRAA >60 01/29/2019 0905   GFRAA >60 12/01/2018 0808    No results found for: TOTALPROTELP, ALBUMINELP, A1GS, A2GS, BETS, BETA2SER, GAMS, MSPIKE, SPEI  No results found for: KPAFRELGTCHN, LAMBDASER, [facility]  Lab Results  Component Value Date   WBC 3.9 (L) 02/05/2019   NEUTROABS 2.4 02/05/2019   HGB 10.9 (L) 02/05/2019   HCT 33.2 (L) 02/05/2019   MCV 96.5 02/05/2019   PLT 292 02/05/2019    '@LASTCHEMISTRY' @  No results found for: LABCA2  No components found for: HBZJIR678  No results for input(s): INR in the last 168 hours.  No results found for: LABCA2  No results found for: LFY101  No results found for: BPZ025  No results found for: ENI778  No results found for: CA2729  No components found for: HGQUANT  No results found for: CEA1 / No results found for: CEA1   No results found for: AFPTUMOR  No results found for: CHROMOGRNA  No results found for: PSA1  Appointment on 02/05/2019  Component Date Value Ref Range Status  . WBC 02/05/2019 3.9* 4.0 - 10.5 K/uL Final  . RBC 02/05/2019 3.44* 3.87 - 5.11 MIL/uL Final  . Hemoglobin 02/05/2019 10.9* 12.0 - 15.0 g/dL Final  . HCT 02/05/2019 33.2* 36.0 - 46.0 % Final  . MCV 02/05/2019 96.5  80.0 - 100.0 fL Final  . MCH 02/05/2019 31.7  26.0 - 34.0 pg Final  . MCHC 02/05/2019 32.8  30.0 - 36.0 g/dL Final  . RDW 02/05/2019 16.5* 11.5 - 15.5 % Final  . Platelets 02/05/2019 292  150 - 400 K/uL Final  . nRBC 02/05/2019 0.0  0.0 - 0.2 % Final  . Neutrophils Relative % 02/05/2019 61  % Final  . Neutro Abs 02/05/2019 2.4  1.7  - 7.7 K/uL Final  . Lymphocytes Relative 02/05/2019 23  % Final  . Lymphs Abs 02/05/2019 0.9  0.7 - 4.0 K/uL Final  . Monocytes Relative 02/05/2019 10  % Final  . Monocytes Absolute 02/05/2019 0.4  0.1 - 1.0 K/uL Final  . Eosinophils Relative 02/05/2019 4  % Final  . Eosinophils Absolute 02/05/2019 0.1  0.0 - 0.5 K/uL Final  . Basophils Relative 02/05/2019 1  % Final  . Basophils  Absolute 02/05/2019 0.0  0.0 - 0.1 K/uL Final  . Immature Granulocytes 02/05/2019 1  % Final  . Abs Immature Granulocytes 02/05/2019 0.03  0.00 - 0.07 K/uL Final   Performed at [facility], [address] [place], [place] [address]    (this displays the last labs from the last 3 days)  No results found for: TOTALPROTELP, ALBUMINELP, A1GS, A2GS, BETS, BETA2SER, GAMS, MSPIKE, SPEI (this displays SPEP labs)  No results found for: KPAFRELGTCHN, LAMBDASER, KAPLAMBRATIO (kappa/lambda light chains)  No results found for: HGBA, HGBA2QUANT, HGBFQUANT, HGBSQUAN (Hemoglobinopathy evaluation)   No results found for: LDH  No results found for: IRON, TIBC, IRONPCTSAT (Iron and TIBC)  No results found for: FERRITIN  Urinalysis    Component Value Date/Time   COLORURINE YELLOW 01/27/2008 0235   APPEARANCEUR CLOUDY (A) 01/27/2008 0235   LABSPEC 1.027 01/27/2008 0235   PHURINE 7.0 01/27/2008 0235   GLUCOSEU NEGATIVE 01/27/2008 0235   HGBUR NEGATIVE 01/27/2008 0235   BILIRUBINUR NEGATIVE 01/27/2008 0235   KETONESUR NEGATIVE 01/27/2008 0235   PROTEINUR NEGATIVE 01/27/2008 0235   UROBILINOGEN 0.2 01/27/2008 0235   NITRITE NEGATIVE 01/27/2008 0235   LEUKOCYTESUR SMALL (A) 01/27/2008 0235     STUDIES: No results found.   ELIGIBLE FOR AVAILABLE RESEARCH PROTOCOL: no   ASSESSMENT: 48 y.o. [facility] woman status post left breast upper outer quadrant biopsy 10/02/2018 for a clinical T2N0, stage IB invasive lobular breast cancer, grade not stated, estrogen and progesterone receptor  positive, HER-2 not amplified, with an MIB-1 of less than 1%  (1) status post left mastectomy and sentinel lymph node sampling 10/18/2018 for a pT3 pN2, stage IIA invasive lobular breast cancer, grade 2, with close but negative margins  (a) 5 of 9 lymph nodes removed had micrometastatic deposits of tumor  (b) immediate expander placement  (2) chemotherapy consisting of doxorubicin and cyclophosphamide in dose dense fashion x4 started 11/22/2018, completed 01/02/2019 followed by weekly paclitaxel x12 starting 01/15/2019  (3) adjuvant radiation to follow  (4) antiestrogens to start at the completion of local treatment  (a) consider goserelin/anastrozole   PLAN: Chava is doing very well.  She continues on Weekly Paclitaxel with good tolerance and has no signs of peripheral neuropathy at this point.  Her labs are stable and I reviewed her cbc with her in detail (CMET pending during patient appointment time).  [name] and I reviewed her treatment plan.  I recommended that she have at least a couple of weeks after her chemotherapy to have implants placed.  I let her know that once dates are determined, if radiation ends up being delayed, she may be started on anti estrogen therapy in the interim.  Once we get closer to that time, everyone can review and Dr. Jana Hakim will decide on that part.    Sylva was recommended to continue with her exercise and to continue icing her fingertips and toes.  We are monitoring her closely for peripheral neuropathy, which thankfully she has not yet experienced.  Paislea will return weekly for her treatments and we will see her in 2 weeks.  She was recommended to continue with the appropriate pandemic precautions. She knows to call for any questions or concerns prior to her next appointment with [place].   A total of (20) minutes of face-to-face time was spent with this patient with greater than 50% of that time in counseling and care-coordination.     Wilber Bihari, NP  02/05/19 9:09 AM Medical Oncology and Hematology  [facility] [place], [place] [address] Tel. [phone]    Fax. [phone]

## 2019-02-12 ENCOUNTER — Other Ambulatory Visit: Payer: Self-pay | Admitting: Oncology

## 2019-02-12 ENCOUNTER — Inpatient Hospital Stay: Payer: 59

## 2019-02-12 ENCOUNTER — Encounter: Payer: Self-pay | Admitting: *Deleted

## 2019-02-12 ENCOUNTER — Other Ambulatory Visit: Payer: Self-pay

## 2019-02-12 VITALS — BP 117/69 | HR 78 | Temp 98.9°F | Resp 18 | Wt 144.5 lb

## 2019-02-12 DIAGNOSIS — Z170 Estrogen receptor positive status [ER+]: Secondary | ICD-10-CM

## 2019-02-12 DIAGNOSIS — C50412 Malignant neoplasm of upper-outer quadrant of left female breast: Secondary | ICD-10-CM

## 2019-02-12 DIAGNOSIS — Z5111 Encounter for antineoplastic chemotherapy: Secondary | ICD-10-CM | POA: Diagnosis not present

## 2019-02-12 DIAGNOSIS — Z95828 Presence of other vascular implants and grafts: Secondary | ICD-10-CM

## 2019-02-12 LAB — COMPREHENSIVE METABOLIC PANEL
ALT: 55 U/L — ABNORMAL HIGH (ref 0–44)
AST: 32 U/L (ref 15–41)
Albumin: 3.6 g/dL (ref 3.5–5.0)
Alkaline Phosphatase: 43 U/L (ref 38–126)
Anion gap: 8 (ref 5–15)
BUN: 15 mg/dL (ref 6–20)
CO2: 24 mmol/L (ref 22–32)
Calcium: 9.1 mg/dL (ref 8.9–10.3)
Chloride: 108 mmol/L (ref 98–111)
Creatinine, Ser: 0.72 mg/dL (ref 0.44–1.00)
GFR calc Af Amer: >60 mL/min (ref >60)
GFR calc non Af Amer: >60 mL/min (ref >60)
Glucose, Bld: 84 mg/dL (ref 70–99)
Potassium: 4.3 mmol/L (ref 3.5–5.1)
Sodium: 140 mmol/L (ref 135–145)
Total Bilirubin: 0.3 mg/dL (ref 0.3–1.2)
Total Protein: 6.1 g/dL — ABNORMAL LOW (ref 6.5–8.1)

## 2019-02-12 LAB — CBC WITH DIFFERENTIAL/PLATELET
Abs Immature Granulocytes: 0.03 10*3/uL (ref 0.00–0.07)
Basophils Absolute: 0 10*3/uL (ref 0.0–0.1)
Basophils Relative: 1 %
Eosinophils Absolute: 0.1 10*3/uL (ref 0.0–0.5)
Eosinophils Relative: 4 %
HCT: 33.6 % — ABNORMAL LOW (ref 36.0–46.0)
Hemoglobin: 11 g/dL — ABNORMAL LOW (ref 12.0–15.0)
Immature Granulocytes: 1 %
Lymphocytes Relative: 22 %
Lymphs Abs: 0.8 10*3/uL (ref 0.7–4.0)
MCH: 32.3 pg (ref 26.0–34.0)
MCHC: 32.7 g/dL (ref 30.0–36.0)
MCV: 98.5 fL (ref 80.0–100.0)
Monocytes Absolute: 0.3 10*3/uL (ref 0.1–1.0)
Monocytes Relative: 8 %
Neutro Abs: 2.4 10*3/uL (ref 1.7–7.7)
Neutrophils Relative %: 64 %
Platelets: 255 10*3/uL (ref 150–400)
RBC: 3.41 MIL/uL — ABNORMAL LOW (ref 3.87–5.11)
RDW: 16.3 % — ABNORMAL HIGH (ref 11.5–15.5)
WBC: 3.8 10*3/uL — ABNORMAL LOW (ref 4.0–10.5)
nRBC: 0 % (ref 0.0–0.2)

## 2019-02-12 MED ORDER — SODIUM CHLORIDE 0.9 % IV SOLN
Freq: Once | INTRAVENOUS | Status: AC
  Administered 2019-02-12: 10:00:00 via INTRAVENOUS
  Filled 2019-02-12: qty 250

## 2019-02-12 MED ORDER — DIPHENHYDRAMINE HCL 50 MG/ML IJ SOLN
25.0000 mg | Freq: Once | INTRAMUSCULAR | Status: AC
  Administered 2019-02-12: 25 mg via INTRAVENOUS

## 2019-02-12 MED ORDER — DEXAMETHASONE SODIUM PHOSPHATE 10 MG/ML IJ SOLN
4.0000 mg | Freq: Once | INTRAMUSCULAR | Status: AC
  Administered 2019-02-12: 4 mg via INTRAVENOUS

## 2019-02-12 MED ORDER — DIPHENHYDRAMINE HCL 50 MG/ML IJ SOLN
INTRAMUSCULAR | Status: AC
  Filled 2019-02-12: qty 1

## 2019-02-12 MED ORDER — DEXAMETHASONE SODIUM PHOSPHATE 10 MG/ML IJ SOLN
INTRAMUSCULAR | Status: AC
  Filled 2019-02-12: qty 1

## 2019-02-12 MED ORDER — FAMOTIDINE IN NACL 20-0.9 MG/50ML-% IV SOLN
20.0000 mg | Freq: Once | INTRAVENOUS | Status: AC
  Administered 2019-02-12: 20 mg via INTRAVENOUS

## 2019-02-12 MED ORDER — HEPARIN SOD (PORK) LOCK FLUSH 100 UNIT/ML IV SOLN
500.0000 [IU] | Freq: Once | INTRAVENOUS | Status: AC | PRN
  Administered 2019-02-12: 500 [IU]
  Filled 2019-02-12: qty 5

## 2019-02-12 MED ORDER — PREDNISONE 5 MG PO TABS: ORAL_TABLET | ORAL | 0 refills | Status: DC

## 2019-02-12 MED ORDER — FAMOTIDINE IN NACL 20-0.9 MG/50ML-% IV SOLN
INTRAVENOUS | Status: AC
  Filled 2019-02-12: qty 50

## 2019-02-12 MED ORDER — SODIUM CHLORIDE 0.9 % IV SOLN
80.0000 mg/m2 | Freq: Once | INTRAVENOUS | Status: AC
  Administered 2019-02-12: 138 mg via INTRAVENOUS
  Filled 2019-02-12: qty 23

## 2019-02-12 MED ORDER — SODIUM CHLORIDE 0.9% FLUSH
10.0000 mL | INTRAVENOUS | Status: DC | PRN
  Administered 2019-02-12: 12:00:00 10 mL
  Filled 2019-02-12: qty 10

## 2019-02-12 MED ORDER — SODIUM CHLORIDE 0.9% FLUSH
10.0000 mL | Freq: Once | INTRAVENOUS | Status: AC
  Administered 2019-02-12: 10 mL
  Filled 2019-02-12: qty 10

## 2019-02-12 NOTE — Patient Instructions (Signed)
[facility] Discharge Instructions for Patients Receiving Chemotherapy  Today you received the following chemotherapy agents:  Taxol.  To help prevent nausea and vomiting after your treatment, we encourage you to take your nausea medication as directed.   If you develop nausea and vomiting that is not controlled by your nausea medication, call the clinic.   BELOW ARE SYMPTOMS THAT SHOULD BE REPORTED IMMEDIATELY:  *FEVER GREATER THAN 100.5 F  *CHILLS WITH OR WITHOUT FEVER  NAUSEA AND VOMITING THAT IS NOT CONTROLLED WITH YOUR NAUSEA MEDICATION  *UNUSUAL SHORTNESS OF BREATH  *UNUSUAL BRUISING OR BLEEDING  TENDERNESS IN MOUTH AND THROAT WITH OR WITHOUT PRESENCE OF ULCERS  *URINARY PROBLEMS  *BOWEL PROBLEMS  UNUSUAL RASH Items with * indicate a potential emergency and should be followed up as soon as possible.  Feel free to call the clinic should you have any questions or concerns. The clinic phone number is [phone].  Please show the CHEMO ALERT CARD at check-in to the Emergency Department and triage nurse.   

## 2019-02-12 NOTE — Progress Notes (Unsigned)
I was asked to come by and see [name] because she has a facial rash.  She tells me this has been coming month since she took [place].  We attributed it to that and changed her antibiotics and of course she is now off antibiotics but nevertheless the rash is become a little bit more noticeable.  It does not change immediately one way or the other with each Taxol treatments.  It simply seems to her to be getting worse.  The rash is only on her face, not anywhere else  On exam she does have a confluent erythematous rash over the face which is smooth over the cheeks slightly granular over the forehead  I think this is very likely a sun rash even though she has not particularly been outside.  I do not think it is going to be related to the Taxol because it is not all over the body.  I am asking her to stay away from the sun completely and use sunscreen if she is outside.  I am also putting her on a Medrol Dosepak.  We are continuing treatment today.  She is also concerned about weight gain.  We discussed diet issues.  She will see me next week.  Hopefully things will have improved by then.

## 2019-02-12 NOTE — Patient Instructions (Signed)

## 2019-02-19 ENCOUNTER — Inpatient Hospital Stay (HOSPITAL_BASED_OUTPATIENT_CLINIC_OR_DEPARTMENT_OTHER): Payer: 59 | Admitting: Oncology

## 2019-02-19 ENCOUNTER — Inpatient Hospital Stay: Payer: 59

## 2019-02-19 ENCOUNTER — Telehealth: Payer: Self-pay | Admitting: Oncology

## 2019-02-19 ENCOUNTER — Telehealth: Payer: Self-pay | Admitting: Radiation Oncology

## 2019-02-19 ENCOUNTER — Inpatient Hospital Stay: Payer: 59 | Attending: Oncology

## 2019-02-19 ENCOUNTER — Other Ambulatory Visit: Payer: Self-pay

## 2019-02-19 VITALS — BP 116/68 | HR 78 | Temp 97.8°F | Resp 18 | Ht 66.0 in | Wt 145.5 lb

## 2019-02-19 DIAGNOSIS — Z170 Estrogen receptor positive status [ER+]: Secondary | ICD-10-CM

## 2019-02-19 DIAGNOSIS — C50412 Malignant neoplasm of upper-outer quadrant of left female breast: Secondary | ICD-10-CM

## 2019-02-19 DIAGNOSIS — C773 Secondary and unspecified malignant neoplasm of axilla and upper limb lymph nodes: Secondary | ICD-10-CM | POA: Insufficient documentation

## 2019-02-19 DIAGNOSIS — Z5111 Encounter for antineoplastic chemotherapy: Secondary | ICD-10-CM | POA: Diagnosis present

## 2019-02-19 DIAGNOSIS — Z95828 Presence of other vascular implants and grafts: Secondary | ICD-10-CM

## 2019-02-19 LAB — COMPREHENSIVE METABOLIC PANEL
ALT: 39 U/L (ref 0–44)
AST: 19 U/L (ref 15–41)
Albumin: 3.8 g/dL (ref 3.5–5.0)
Alkaline Phosphatase: 44 U/L (ref 38–126)
Anion gap: 9 (ref 5–15)
BUN: 16 mg/dL (ref 6–20)
CO2: 25 mmol/L (ref 22–32)
Calcium: 9.2 mg/dL (ref 8.9–10.3)
Chloride: 108 mmol/L (ref 98–111)
Creatinine, Ser: 0.77 mg/dL (ref 0.44–1.00)
GFR calc Af Amer: >60 mL/min (ref >60)
GFR calc non Af Amer: >60 mL/min (ref >60)
Glucose, Bld: 70 mg/dL (ref 70–99)
Potassium: 4.1 mmol/L (ref 3.5–5.1)
Sodium: 142 mmol/L (ref 135–145)
Total Bilirubin: 0.3 mg/dL (ref 0.3–1.2)
Total Protein: 6.4 g/dL — ABNORMAL LOW (ref 6.5–8.1)

## 2019-02-19 LAB — CBC WITH DIFFERENTIAL/PLATELET
Abs Immature Granulocytes: 0.05 10*3/uL (ref 0.00–0.07)
Basophils Absolute: 0 10*3/uL (ref 0.0–0.1)
Basophils Relative: 1 %
Eosinophils Absolute: 0.1 10*3/uL (ref 0.0–0.5)
Eosinophils Relative: 3 %
HCT: 36.6 % (ref 36.0–46.0)
Hemoglobin: 11.8 g/dL — ABNORMAL LOW (ref 12.0–15.0)
Immature Granulocytes: 1 %
Lymphocytes Relative: 21 %
Lymphs Abs: 1 10*3/uL (ref 0.7–4.0)
MCH: 32.4 pg (ref 26.0–34.0)
MCHC: 32.2 g/dL (ref 30.0–36.0)
MCV: 100.5 fL — ABNORMAL HIGH (ref 80.0–100.0)
Monocytes Absolute: 0.5 10*3/uL (ref 0.1–1.0)
Monocytes Relative: 10 %
Neutro Abs: 3 10*3/uL (ref 1.7–7.7)
Neutrophils Relative %: 64 %
Platelets: 294 10*3/uL (ref 150–400)
RBC: 3.64 MIL/uL — ABNORMAL LOW (ref 3.87–5.11)
RDW: 15.9 % — ABNORMAL HIGH (ref 11.5–15.5)
WBC: 4.7 10*3/uL (ref 4.0–10.5)
nRBC: 0 % (ref 0.0–0.2)

## 2019-02-19 MED ORDER — SODIUM CHLORIDE 0.9 % IV SOLN
80.0000 mg/m2 | Freq: Once | INTRAVENOUS | Status: AC
  Administered 2019-02-19: 138 mg via INTRAVENOUS
  Filled 2019-02-19: qty 23

## 2019-02-19 MED ORDER — DEXAMETHASONE SODIUM PHOSPHATE 10 MG/ML IJ SOLN
4.0000 mg | Freq: Once | INTRAMUSCULAR | Status: AC
  Administered 2019-02-19: 4 mg via INTRAVENOUS

## 2019-02-19 MED ORDER — DEXAMETHASONE SODIUM PHOSPHATE 10 MG/ML IJ SOLN
INTRAMUSCULAR | Status: AC
  Filled 2019-02-19: qty 1

## 2019-02-19 MED ORDER — FAMOTIDINE IN NACL 20-0.9 MG/50ML-% IV SOLN
INTRAVENOUS | Status: AC
  Filled 2019-02-19: qty 50

## 2019-02-19 MED ORDER — HEPARIN SOD (PORK) LOCK FLUSH 100 UNIT/ML IV SOLN
500.0000 [IU] | Freq: Once | INTRAVENOUS | Status: AC | PRN
  Administered 2019-02-19: 13:00:00 500 [IU]
  Filled 2019-02-19: qty 5

## 2019-02-19 MED ORDER — SODIUM CHLORIDE 0.9% FLUSH
10.0000 mL | INTRAVENOUS | Status: DC | PRN
  Administered 2019-02-19: 13:00:00 10 mL
  Filled 2019-02-19: qty 10

## 2019-02-19 MED ORDER — SODIUM CHLORIDE 0.9 % IV SOLN
Freq: Once | INTRAVENOUS | Status: AC
  Administered 2019-02-19: 10:00:00 via INTRAVENOUS
  Filled 2019-02-19: qty 250

## 2019-02-19 MED ORDER — SODIUM CHLORIDE 0.9% FLUSH
10.0000 mL | Freq: Once | INTRAVENOUS | Status: AC
  Administered 2019-02-19: 10 mL
  Filled 2019-02-19: qty 10

## 2019-02-19 MED ORDER — DIPHENHYDRAMINE HCL 50 MG/ML IJ SOLN
INTRAMUSCULAR | Status: AC
  Filled 2019-02-19: qty 1

## 2019-02-19 MED ORDER — FAMOTIDINE IN NACL 20-0.9 MG/50ML-% IV SOLN
20.0000 mg | Freq: Once | INTRAVENOUS | Status: AC
  Administered 2019-02-19: 20 mg via INTRAVENOUS

## 2019-02-19 MED ORDER — DIPHENHYDRAMINE HCL 50 MG/ML IJ SOLN
25.0000 mg | Freq: Once | INTRAMUSCULAR | Status: AC
  Administered 2019-02-19: 25 mg via INTRAVENOUS

## 2019-02-19 NOTE — Telephone Encounter (Signed)
Confirmed with patient next appointment for 8/10. Patient will confirm remaining appointments via my chart.

## 2019-02-19 NOTE — Telephone Encounter (Signed)
New message: ° ° °LVM for patient to return call to schedule appt from referral received. °

## 2019-02-19 NOTE — Patient Instructions (Signed)
[facility] Discharge Instructions for Patients Receiving Chemotherapy  Today you received the following chemotherapy agents:  Taxol.  To help prevent nausea and vomiting after your treatment, we encourage you to take your nausea medication as directed.   If you develop nausea and vomiting that is not controlled by your nausea medication, call the clinic.   BELOW ARE SYMPTOMS THAT SHOULD BE REPORTED IMMEDIATELY:  *FEVER GREATER THAN 100.5 F  *CHILLS WITH OR WITHOUT FEVER  NAUSEA AND VOMITING THAT IS NOT CONTROLLED WITH YOUR NAUSEA MEDICATION  *UNUSUAL SHORTNESS OF BREATH  *UNUSUAL BRUISING OR BLEEDING  TENDERNESS IN MOUTH AND THROAT WITH OR WITHOUT PRESENCE OF ULCERS  *URINARY PROBLEMS  *BOWEL PROBLEMS  UNUSUAL RASH Items with * indicate a potential emergency and should be followed up as soon as possible.  Feel free to call the clinic should you have any questions or concerns. The clinic phone number is [phone].  Please show the CHEMO ALERT CARD at check-in to the Emergency Department and triage nurse.   

## 2019-02-19 NOTE — Progress Notes (Signed)
[place]  [phone] Fax:([phone]    ID: [name] DOB: 1970-09-23  MR#: [id number]  VOZ#:[id number]  Patient Care Team: Orpah Melter, MD as PCP - General (Family Medicine) Mauro Kaufmann, RN as Oncology Nurse Navigator Rockwell Germany, RN as Oncology Nurse Navigator Jovita Kussmaul, MD as Consulting Physician (General Surgery) Rambo Sarafian, Virgie Dad, MD as Consulting Physician (Oncology) Kyung Rudd, MD as Consulting Physician (Radiation Oncology) Aloha Gell, MD as Consulting Physician (Obstetrics and Gynecology) [name], Loel Lofty, DO as Attending Physician (Plastic Surgery) Chauncey Cruel, MD OTHER MD:   CHIEF COMPLAINT: Estrogen receptor positive breast cancer  CURRENT TREATMENT: Adjuvant chemotherapy (weekly taxol)   INTERVAL HISTORY: [name] returns today for follow-up and treatment of her estrogen receptor positive breast cancer.  She continues on adjuvant chemotherapy consisting of weekly paclitaxel x12. Today is day 1 cycle 6. She has noticed one small ulcer in her mouth without trauma to her knowledge.  She is already scheduled for definite silicone implant placement mid September, about 10 days after her last paclitaxel treatment, which is adequate.  She has never met with a radiation oncologist today.  Since her last visit here, she has not undergone any additional studies.     REVIEW OF SYSTEMS: [name] notes that her energy levels have been good. She has been walking in the evening when it gets cooler outside. She believes she has some arthritic changes in her.  The patient denies unusual headaches, visual changes, nausea, vomiting, or dizziness. There has been no unusual cough, phlegm production, or pleurisy. This been no change in bowel or bladder habits. The patient denies unexplained fatigue or unexplained weight loss, bleeding, rash, or fever. A detailed review of systems was otherwise  noncontributory.    HISTORY OF CURRENT ILLNESS: From the original intake note:  "[name]" presented with left nipple retraction for 1 week with retroareolar mass in the upper outer quadrant. She underwent bilateral diagnostic mammography with CAD and left breast ultrasonography at [facility] on 09/25/2018 showing: 7 mm oval duct in the left breast; no significant abnormalities in the left axilla. She also underwent additional imaging with left breast digital diagnostic mammogram on 10/02/2018 showing: new 0.4 cm cluster of grouped heterogeneous calcifications in the left breast.   Accordingly on 10/02/2018 she proceeded to biopsy of the left breast area in question. 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 Prognostic indicators significant for: estrogen receptor, 90% positive, with moderate staining intensity and progesterone receptor, 100% positive, with strong staining intensity. Proliferation marker Ki67 at <1%. HER2 negative (1+).   The patient's subsequent history is as detailed above.    PAST MEDICAL HISTORY: Past Medical History:  Diagnosis Date  . Cancer ([place]) 10/18/2018   left breast cancer    PAST SURGICAL HISTORY: Past Surgical History:  Procedure Laterality Date  . BREAST RECONSTRUCTION WITH PLACEMENT OF TISSUE EXPANDER AND FLEX HD (ACELLULAR HYDRATED DERMIS) Left 10/18/2018   Procedure: IMMEDIATEVBREAST RECONSTRUCTION WITH PLACEMENT OF TISSUE EXPANDER AND FLEX HD (ACELLULAR HYDRATED DERMIS);  Surgeon: Wallace Going, DO;  Location: [place];  Service: Plastics;  Laterality: Left;  Marland Kitchen MASTECTOMY    . MASTECTOMY W/ SENTINEL NODE BIOPSY Left 10/18/2018   Procedure: LEFT MASTECTOMY WITH SENTINEL LYMPH NODE BIOPSY;  Surgeon: Jovita Kussmaul, MD;  Location: [place];  Service: General;  Laterality: Left;  . PORTACATH PLACEMENT Right 11/02/2018   Procedure: INSERTION PORT-A-CATH WITH ULTRASOUND;  Surgeon: Jovita Kussmaul, MD;  Location: [place];  Service: General;  Laterality: Right;    FAMILY HISTORY Family History  Problem Relation Age of Onset  . Breast cancer Neg Hx   . Ovarian cancer Neg Hx    As of March 2020, patient's father is living and healthy at age 38. Patient's mother is also living and healthy at age 65. The patient denies a family hx of breast or ovarian cancer. She has 1 sister.  GYNECOLOGIC HISTORY:  No LMP recorded. (Menstrual status: IUD). Menarche: 48 years old Age at first live birth: 48 years old [place] 2 LMP in 2010 Contraceptive: Mirena IUD has been replaced by a ParaGard IUD as of March 2020 She used birth control pills between ages 61 to 7 w/o event HRT n/a  Hysterectomy? no BSO? 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  She has a PhD in Probation officer.  Husband [name] is an Art gallery manager. She lives at home with her husband and 3 children. She has two children, [name] age 36 and [name] age 56. Husband [name] has one daughter, [name] age 68, who lives with them.    ADVANCED DIRECTIVES: In the absence of any documentation to the contrary her husband [name] is her HCPOA.   HEALTH MAINTENANCE: Social History   Tobacco Use  . Smoking status: Never Smoker  . Smokeless tobacco: Never Used  Substance Use Topics  . Alcohol use: Yes    Alcohol/week: 3.0 standard drinks    Types: 3 Standard drinks or equivalent per week    Comment: social  . Drug use: Never     Colonoscopy: never done  PAP: 08/2017  Bone density: never done   Allergies  Allergen Reactions  . Septra [Sulfamethoxazole-Trimethoprim]     Current Outpatient Medications  Medication Sig Dispense Refill   . diazepam (VALIUM) 2 MG tablet Take 1 tablet (2 mg total) by mouth every 6 (six) hours as needed for anxiety. 30 tablet 0  . lidocaine-prilocaine (EMLA) cream Apply to affected area once 30 g 3  . loratadine (CLARITIN) 10 MG tablet Take 1 tablet (10 mg total) by mouth daily. 90 tablet 0  . LORazepam (ATIVAN) 0.5 MG tablet Take 1 tablet (0.5 mg total) by mouth at bedtime as needed (Nausea or vomiting). 30 tablet 0  . predniSONE (DELTASONE) 5 MG tablet Take 6 tablets with breakfast beginning 02/13/2019, then 5 tablets the next day, 4 tablets the next and so on until done 21 tablet 0  . prochlorperazine (COMPAZINE) 10 MG tablet Take 1 tablet (10 mg total) by mouth every 6 (six) hours as needed (Nausea or vomiting). 30 tablet 1  . valACYclovir (VALTREX) 1000 MG tablet Take 1 tablet (1,000 mg total) by mouth daily. 10 tablet 0   No current facility-administered medications for this visit.     OBJECTIVE: Young white woman who appears well Vitals:   02/19/19 0935  BP: 116/68  Pulse: 78  Resp: 18  Temp: 97.8 F (36.6 C)  SpO2: 100%     Body mass index is 23.48 kg/m.   Wt Readings from Last 3 Encounters:  02/19/19 145 lb 8 oz (66 kg)  02/12/19 144 lb 8 oz (65.5 kg)  02/05/19 143 lb (64.9  kg)      ECOG FS:1 - Symptomatic but completely ambulatory  Sclerae unicteric, EOMs intact Wearing a mask; I checked her oropharynx for a lesion but did not see 1 No cervical or supraclavicular adenopathy Lungs no rales or rhonchi Heart regular rate and rhythm Abd soft, nontender, positive bowel sounds MSK no focal spinal tenderness, no upper extremity lymphedema Neuro: nonfocal, well oriented, appropriate affect Breasts: The right breast is unremarkable.  The left breast is status post mastectomy with expander in place.  The initial cosmetic result is very good.  There is no evidence of local recurrence.  Both axillae are benign. Skin: Facial rash continues.  The photo below is not well focused but  it does show the very clear demarcation line which tells me this is photosensitivity related   02/19/2019      LAB RESULTS:  CMP     Component Value Date/Time   NA 140 02/12/2019 0855   K 4.3 02/12/2019 0855   CL 108 02/12/2019 0855   CO2 24 02/12/2019 0855   GLUCOSE 84 02/12/2019 0855   BUN 15 02/12/2019 0855   CREATININE 0.72 02/12/2019 0855   CREATININE 0.75 12/01/2018 0808   CALCIUM 9.1 02/12/2019 0855   PROT 6.1 (L) 02/12/2019 0855   ALBUMIN 3.6 02/12/2019 0855   AST 32 02/12/2019 0855   AST 13 (L) 12/01/2018 0808   ALT 55 (H) 02/12/2019 0855   ALT 21 12/01/2018 0808   ALKPHOS 43 02/12/2019 0855   BILITOT 0.3 02/12/2019 0855   BILITOT <0.2 (L) 12/01/2018 0808   GFRNONAA >60 02/12/2019 0855   GFRNONAA >60 12/01/2018 0808   GFRAA >60 02/12/2019 0855   GFRAA >60 12/01/2018 0808    No results found for: TOTALPROTELP, ALBUMINELP, A1GS, A2GS, BETS, BETA2SER, GAMS, MSPIKE, SPEI  No results found for: KPAFRELGTCHN, LAMBDASER, [facility]  Lab Results  Component Value Date   WBC 4.7 02/19/2019   NEUTROABS 3.0 02/19/2019   HGB 11.8 (L) 02/19/2019   HCT 36.6 02/19/2019   MCV 100.5 (H) 02/19/2019   PLT 294 02/19/2019    No results found for: LABCA2  No components found for: BZJIRC789  No results for input(s): INR in the last 168 hours.  No results found for: LABCA2  No results found for: FYB017  No results found for: PZW258  No results found for: NID782  No results found for: CA2729  No components found for: HGQUANT  No results found for: CEA1 / No results found for: CEA1   No results found for: AFPTUMOR  No results found for: CHROMOGRNA  No results found for: PSA1  Appointment on 02/19/2019  Component Date Value Ref Range Status  . WBC 02/19/2019 4.7  4.0 - 10.5 K/uL Final  . RBC 02/19/2019 3.64* 3.87 - 5.11 MIL/uL Final  . Hemoglobin 02/19/2019 11.8* 12.0 - 15.0 g/dL Final  . HCT 02/19/2019 36.6  36.0 - 46.0 % Final  . MCV 02/19/2019  100.5* 80.0 - 100.0 fL Final  . MCH 02/19/2019 32.4  26.0 - 34.0 pg Final  . MCHC 02/19/2019 32.2  30.0 - 36.0 g/dL Final  . RDW 02/19/2019 15.9* 11.5 - 15.5 % Final  . Platelets 02/19/2019 294  150 - 400 K/uL Final  . nRBC 02/19/2019 0.0  0.0 - 0.2 % Final  . Neutrophils Relative % 02/19/2019 64  % Final  . Neutro Abs 02/19/2019 3.0  1.7 - 7.7 K/uL Final  . Lymphocytes Relative 02/19/2019 21  % Final  . Lymphs Abs  02/19/2019 1.0  0.7 - 4.0 K/uL Final  . Monocytes Relative 02/19/2019 10  % Final  . Monocytes Absolute 02/19/2019 0.5  0.1 - 1.0 K/uL Final  . Eosinophils Relative 02/19/2019 3  % Final  . Eosinophils Absolute 02/19/2019 0.1  0.0 - 0.5 K/uL Final  . Basophils Relative 02/19/2019 1  % Final  . Basophils Absolute 02/19/2019 0.0  0.0 - 0.1 K/uL Final  . Immature Granulocytes 02/19/2019 1  % Final  . Abs Immature Granulocytes 02/19/2019 0.05  0.00 - 0.07 K/uL Final   Performed at [facility], [address] [place],  [address]    (this displays the last labs from the last 3 days)  No results found for: TOTALPROTELP, ALBUMINELP, A1GS, A2GS, BETS, BETA2SER, GAMS, MSPIKE, SPEI (this displays SPEP labs)  No results found for: KPAFRELGTCHN, LAMBDASER, KAPLAMBRATIO (kappa/lambda light chains)  No results found for: HGBA, HGBA2QUANT, HGBFQUANT, HGBSQUAN (Hemoglobinopathy evaluation)   No results found for: LDH  No results found for: IRON, TIBC, IRONPCTSAT (Iron and TIBC)  No results found for: FERRITIN  Urinalysis    Component Value Date/Time   COLORURINE YELLOW 01/27/2008 0235   APPEARANCEUR CLOUDY (A) 01/27/2008 0235   LABSPEC 1.027 01/27/2008 0235   PHURINE 7.0 01/27/2008 0235   GLUCOSEU NEGATIVE 01/27/2008 0235   HGBUR NEGATIVE 01/27/2008 0235   BILIRUBINUR NEGATIVE 01/27/2008 0235   KETONESUR NEGATIVE 01/27/2008 0235   PROTEINUR NEGATIVE 01/27/2008 0235   UROBILINOGEN 0.2 01/27/2008 0235   NITRITE NEGATIVE 01/27/2008 0235    LEUKOCYTESUR SMALL (A) 01/27/2008 0235     STUDIES: No results found.   ELIGIBLE FOR AVAILABLE RESEARCH PROTOCOL: no   ASSESSMENT: 48 y.o. [facility] woman status post left breast upper outer quadrant biopsy 10/02/2018 for a clinical T2N0, stage IB invasive lobular breast cancer, grade not stated, estrogen and progesterone receptor positive, HER-2 not amplified, with an MIB-1 of less than 1%  (1) status post left mastectomy and sentinel lymph node sampling 10/18/2018 for a pT3 pN2, stage IIA invasive lobular breast cancer, grade 2, with close but negative margins  (a) 5 of 9 lymph nodes removed had micrometastatic deposits of tumor  (b) immediate expander placement  (2) chemotherapy consisting of doxorubicin and cyclophosphamide in dose dense fashion x4 started 11/22/2018, completed 01/02/2019, followed by weekly paclitaxel x12 starting 01/15/2019  (3) adjuvant radiation to follow  (4) antiestrogens to start at the completion of local treatment  (a) consider goserelin/anastrozole   PLAN: [name] continues to tolerate paclitaxel generally well and she will proceed to cycle #6 of 12 planned today.  The one issue she is having is the rash.  As noted above, this is extremely well demarcated and only occurs on the face, which is where she gets sun exposure.  She has been very careful to use sunscreen, to rule out in the evening and early morning, etc., but the rash does persist although it did get a bit better with steroids  She is going to try Cortaid topically to see if that is helpful as we do not want to continue systemic steroids too long.  I offered to give her a one-week break on her chemo but she really wants to get it done.  She is scheduled for implant placement mid-September.  Likely she will start adjuvant radiation sometime in October.  She has not met with our radiation doctor and we will try to schedule that for her 1 of the days that she comes for Taxol treatment to "  get it  moving".  She knows to call for any other issue that may develop before the next visit.   Wilkin Lippy, Virgie Dad, MD  02/19/19 9:54 AM Medical Oncology and Hematology [facility] [address] [place], [place] [address] Tel. [phone]    Fax. [phone]  I, Jacqualyn Posey am acting as a Education administrator for Chauncey Cruel, MD.   I, Lurline Del MD, have reviewed the above documentation for accuracy and completeness, and I agree with the above.

## 2019-02-20 ENCOUNTER — Encounter: Payer: Self-pay | Admitting: *Deleted

## 2019-02-26 ENCOUNTER — Inpatient Hospital Stay: Payer: 59

## 2019-02-26 ENCOUNTER — Other Ambulatory Visit: Payer: 59

## 2019-02-26 ENCOUNTER — Other Ambulatory Visit: Payer: Self-pay

## 2019-02-26 VITALS — BP 108/65 | HR 76 | Temp 98.9°F | Resp 18

## 2019-02-26 DIAGNOSIS — Z170 Estrogen receptor positive status [ER+]: Secondary | ICD-10-CM

## 2019-02-26 DIAGNOSIS — Z95828 Presence of other vascular implants and grafts: Secondary | ICD-10-CM

## 2019-02-26 DIAGNOSIS — Z5111 Encounter for antineoplastic chemotherapy: Secondary | ICD-10-CM | POA: Diagnosis not present

## 2019-02-26 DIAGNOSIS — C50412 Malignant neoplasm of upper-outer quadrant of left female breast: Secondary | ICD-10-CM

## 2019-02-26 LAB — CBC WITH DIFFERENTIAL/PLATELET
Abs Immature Granulocytes: 0.02 10*3/uL (ref 0.00–0.07)
Basophils Absolute: 0 10*3/uL (ref 0.0–0.1)
Basophils Relative: 1 %
Eosinophils Absolute: 0.1 10*3/uL (ref 0.0–0.5)
Eosinophils Relative: 3 %
HCT: 34.1 % — ABNORMAL LOW (ref 36.0–46.0)
Hemoglobin: 11.1 g/dL — ABNORMAL LOW (ref 12.0–15.0)
Immature Granulocytes: 1 %
Lymphocytes Relative: 29 %
Lymphs Abs: 0.8 10*3/uL (ref 0.7–4.0)
MCH: 32.6 pg (ref 26.0–34.0)
MCHC: 32.6 g/dL (ref 30.0–36.0)
MCV: 100.3 fL — ABNORMAL HIGH (ref 80.0–100.0)
Monocytes Absolute: 0.3 10*3/uL (ref 0.1–1.0)
Monocytes Relative: 12 %
Neutro Abs: 1.6 10*3/uL — ABNORMAL LOW (ref 1.7–7.7)
Neutrophils Relative %: 54 %
Platelets: 256 10*3/uL (ref 150–400)
RBC: 3.4 MIL/uL — ABNORMAL LOW (ref 3.87–5.11)
RDW: 14.8 % (ref 11.5–15.5)
WBC: 2.9 10*3/uL — ABNORMAL LOW (ref 4.0–10.5)
nRBC: 0 % (ref 0.0–0.2)

## 2019-02-26 LAB — COMPREHENSIVE METABOLIC PANEL
ALT: 49 U/L — ABNORMAL HIGH (ref 0–44)
AST: 28 U/L (ref 15–41)
Albumin: 3.7 g/dL (ref 3.5–5.0)
Alkaline Phosphatase: 43 U/L (ref 38–126)
Anion gap: 11 (ref 5–15)
BUN: 13 mg/dL (ref 6–20)
CO2: 23 mmol/L (ref 22–32)
Calcium: 9.2 mg/dL (ref 8.9–10.3)
Chloride: 108 mmol/L (ref 98–111)
Creatinine, Ser: 0.8 mg/dL (ref 0.44–1.00)
GFR calc Af Amer: >60 mL/min (ref >60)
GFR calc non Af Amer: >60 mL/min (ref >60)
Glucose, Bld: 85 mg/dL (ref 70–99)
Potassium: 4.4 mmol/L (ref 3.5–5.1)
Sodium: 142 mmol/L (ref 135–145)
Total Bilirubin: 0.3 mg/dL (ref 0.3–1.2)
Total Protein: 6.2 g/dL — ABNORMAL LOW (ref 6.5–8.1)

## 2019-02-26 MED ORDER — DEXAMETHASONE SODIUM PHOSPHATE 10 MG/ML IJ SOLN
4.0000 mg | Freq: Once | INTRAMUSCULAR | Status: AC
  Administered 2019-02-26: 4 mg via INTRAVENOUS

## 2019-02-26 MED ORDER — SODIUM CHLORIDE 0.9 % IV SOLN
80.0000 mg/m2 | Freq: Once | INTRAVENOUS | Status: AC
  Administered 2019-02-26: 10:00:00 138 mg via INTRAVENOUS
  Filled 2019-02-26: qty 23

## 2019-02-26 MED ORDER — SODIUM CHLORIDE 0.9% FLUSH
10.0000 mL | INTRAVENOUS | Status: DC | PRN
  Administered 2019-02-26: 10 mL
  Filled 2019-02-26: qty 10

## 2019-02-26 MED ORDER — DEXAMETHASONE SODIUM PHOSPHATE 10 MG/ML IJ SOLN
INTRAMUSCULAR | Status: AC
  Filled 2019-02-26: qty 1

## 2019-02-26 MED ORDER — FAMOTIDINE IN NACL 20-0.9 MG/50ML-% IV SOLN
INTRAVENOUS | Status: AC
  Filled 2019-02-26: qty 50

## 2019-02-26 MED ORDER — SODIUM CHLORIDE 0.9% FLUSH
10.0000 mL | Freq: Once | INTRAVENOUS | Status: AC
  Administered 2019-02-26: 08:00:00 10 mL
  Filled 2019-02-26: qty 10

## 2019-02-26 MED ORDER — FAMOTIDINE IN NACL 20-0.9 MG/50ML-% IV SOLN
20.0000 mg | Freq: Once | INTRAVENOUS | Status: AC
  Administered 2019-02-26: 20 mg via INTRAVENOUS

## 2019-02-26 MED ORDER — SODIUM CHLORIDE 0.9 % IV SOLN
Freq: Once | INTRAVENOUS | Status: AC
  Administered 2019-02-26: 09:00:00 via INTRAVENOUS
  Filled 2019-02-26: qty 250

## 2019-02-26 MED ORDER — DIPHENHYDRAMINE HCL 50 MG/ML IJ SOLN
25.0000 mg | Freq: Once | INTRAMUSCULAR | Status: AC
  Administered 2019-02-26: 09:00:00 25 mg via INTRAVENOUS

## 2019-02-26 MED ORDER — HEPARIN SOD (PORK) LOCK FLUSH 100 UNIT/ML IV SOLN
500.0000 [IU] | Freq: Once | INTRAVENOUS | Status: AC | PRN
  Administered 2019-02-26: 500 [IU]
  Filled 2019-02-26: qty 5

## 2019-02-26 MED ORDER — DIPHENHYDRAMINE HCL 50 MG/ML IJ SOLN
INTRAMUSCULAR | Status: AC
  Filled 2019-02-26: qty 1

## 2019-02-26 NOTE — Patient Instructions (Signed)
[facility] Discharge Instructions for Patients Receiving Chemotherapy  Today you received the following chemotherapy agents:  Taxol.  To help prevent nausea and vomiting after your treatment, we encourage you to take your nausea medication as directed.   If you develop nausea and vomiting that is not controlled by your nausea medication, call the clinic.   BELOW ARE SYMPTOMS THAT SHOULD BE REPORTED IMMEDIATELY:  *FEVER GREATER THAN 100.5 F  *CHILLS WITH OR WITHOUT FEVER  NAUSEA AND VOMITING THAT IS NOT CONTROLLED WITH YOUR NAUSEA MEDICATION  *UNUSUAL SHORTNESS OF BREATH  *UNUSUAL BRUISING OR BLEEDING  TENDERNESS IN MOUTH AND THROAT WITH OR WITHOUT PRESENCE OF ULCERS  *URINARY PROBLEMS  *BOWEL PROBLEMS  UNUSUAL RASH Items with * indicate a potential emergency and should be followed up as soon as possible.  Feel free to call the clinic should you have any questions or concerns. The clinic phone number is [phone].  Please show the CHEMO ALERT CARD at check-in to the Emergency Department and triage nurse.   

## 2019-03-05 ENCOUNTER — Inpatient Hospital Stay: Payer: 59

## 2019-03-05 ENCOUNTER — Other Ambulatory Visit: Payer: Self-pay

## 2019-03-05 ENCOUNTER — Inpatient Hospital Stay (HOSPITAL_BASED_OUTPATIENT_CLINIC_OR_DEPARTMENT_OTHER): Payer: 59 | Admitting: Adult Health

## 2019-03-05 ENCOUNTER — Encounter: Payer: Self-pay | Admitting: Adult Health

## 2019-03-05 ENCOUNTER — Telehealth: Payer: Self-pay

## 2019-03-05 VITALS — BP 121/66 | HR 89 | Temp 98.3°F | Resp 18 | Ht 66.0 in | Wt 149.0 lb

## 2019-03-05 DIAGNOSIS — C50412 Malignant neoplasm of upper-outer quadrant of left female breast: Secondary | ICD-10-CM

## 2019-03-05 DIAGNOSIS — Z170 Estrogen receptor positive status [ER+]: Secondary | ICD-10-CM

## 2019-03-05 DIAGNOSIS — Z95828 Presence of other vascular implants and grafts: Secondary | ICD-10-CM

## 2019-03-05 DIAGNOSIS — R399 Unspecified symptoms and signs involving the genitourinary system: Secondary | ICD-10-CM

## 2019-03-05 DIAGNOSIS — Z5111 Encounter for antineoplastic chemotherapy: Secondary | ICD-10-CM | POA: Diagnosis not present

## 2019-03-05 LAB — CBC WITH DIFFERENTIAL/PLATELET
Abs Immature Granulocytes: 0.02 10*3/uL (ref 0.00–0.07)
Basophils Absolute: 0 10*3/uL (ref 0.0–0.1)
Basophils Relative: 1 %
Eosinophils Absolute: 0.1 10*3/uL (ref 0.0–0.5)
Eosinophils Relative: 3 %
HCT: 34.9 % — ABNORMAL LOW (ref 36.0–46.0)
Hemoglobin: 11.3 g/dL — ABNORMAL LOW (ref 12.0–15.0)
Immature Granulocytes: 1 %
Lymphocytes Relative: 28 %
Lymphs Abs: 0.8 10*3/uL (ref 0.7–4.0)
MCH: 33 pg (ref 26.0–34.0)
MCHC: 32.4 g/dL (ref 30.0–36.0)
MCV: 102 fL — ABNORMAL HIGH (ref 80.0–100.0)
Monocytes Absolute: 0.3 10*3/uL (ref 0.1–1.0)
Monocytes Relative: 9 %
Neutro Abs: 1.7 10*3/uL (ref 1.7–7.7)
Neutrophils Relative %: 58 %
Platelets: 277 10*3/uL (ref 150–400)
RBC: 3.42 MIL/uL — ABNORMAL LOW (ref 3.87–5.11)
RDW: 14.3 % (ref 11.5–15.5)
WBC: 2.9 10*3/uL — ABNORMAL LOW (ref 4.0–10.5)
nRBC: 0 % (ref 0.0–0.2)

## 2019-03-05 LAB — URINALYSIS, COMPLETE (UACMP) WITH MICROSCOPIC
Bilirubin Urine: NEGATIVE
Glucose, UA: NEGATIVE mg/dL
Hgb urine dipstick: NEGATIVE
Ketones, ur: NEGATIVE mg/dL
Leukocytes,Ua: NEGATIVE
Nitrite: NEGATIVE
Protein, ur: NEGATIVE mg/dL
Specific Gravity, Urine: 1.017 (ref 1.005–1.030)
pH: 5 (ref 5.0–8.0)

## 2019-03-05 LAB — COMPREHENSIVE METABOLIC PANEL
ALT: 86 U/L — ABNORMAL HIGH (ref 0–44)
AST: 42 U/L — ABNORMAL HIGH (ref 15–41)
Albumin: 3.6 g/dL (ref 3.5–5.0)
Alkaline Phosphatase: 42 U/L (ref 38–126)
Anion gap: 10 (ref 5–15)
BUN: 16 mg/dL (ref 6–20)
CO2: 23 mmol/L (ref 22–32)
Calcium: 8.7 mg/dL — ABNORMAL LOW (ref 8.9–10.3)
Chloride: 109 mmol/L (ref 98–111)
Creatinine, Ser: 0.74 mg/dL (ref 0.44–1.00)
GFR calc Af Amer: >60 mL/min (ref >60)
GFR calc non Af Amer: >60 mL/min (ref >60)
Glucose, Bld: 105 mg/dL — ABNORMAL HIGH (ref 70–99)
Potassium: 4.1 mmol/L (ref 3.5–5.1)
Sodium: 142 mmol/L (ref 135–145)
Total Bilirubin: 0.2 mg/dL — ABNORMAL LOW (ref 0.3–1.2)
Total Protein: 6.1 g/dL — ABNORMAL LOW (ref 6.5–8.1)

## 2019-03-05 MED ORDER — FAMOTIDINE IN NACL 20-0.9 MG/50ML-% IV SOLN
20.0000 mg | Freq: Once | INTRAVENOUS | Status: AC
  Administered 2019-03-05: 20 mg via INTRAVENOUS

## 2019-03-05 MED ORDER — DIPHENHYDRAMINE HCL 50 MG/ML IJ SOLN
INTRAMUSCULAR | Status: AC
  Filled 2019-03-05: qty 1

## 2019-03-05 MED ORDER — FAMOTIDINE IN NACL 20-0.9 MG/50ML-% IV SOLN
INTRAVENOUS | Status: AC
  Filled 2019-03-05: qty 50

## 2019-03-05 MED ORDER — DEXAMETHASONE SODIUM PHOSPHATE 10 MG/ML IJ SOLN
INTRAMUSCULAR | Status: AC
  Filled 2019-03-05: qty 1

## 2019-03-05 MED ORDER — SODIUM CHLORIDE 0.9% FLUSH
10.0000 mL | INTRAVENOUS | Status: DC | PRN
  Administered 2019-03-05: 12:00:00 10 mL
  Filled 2019-03-05: qty 10

## 2019-03-05 MED ORDER — DIPHENHYDRAMINE HCL 50 MG/ML IJ SOLN
25.0000 mg | Freq: Once | INTRAMUSCULAR | Status: AC
  Administered 2019-03-05: 25 mg via INTRAVENOUS

## 2019-03-05 MED ORDER — SODIUM CHLORIDE 0.9% FLUSH
10.0000 mL | Freq: Once | INTRAVENOUS | Status: AC
  Administered 2019-03-05: 08:00:00 10 mL
  Filled 2019-03-05: qty 10

## 2019-03-05 MED ORDER — HEPARIN SOD (PORK) LOCK FLUSH 100 UNIT/ML IV SOLN
500.0000 [IU] | Freq: Once | INTRAVENOUS | Status: AC | PRN
  Administered 2019-03-05: 12:00:00 500 [IU]
  Filled 2019-03-05: qty 5

## 2019-03-05 MED ORDER — MAGIC MOUTHWASH W/LIDOCAINE: 5.0000 mL | Freq: Four times a day (QID) | ORAL | 0 refills | Status: DC | PRN

## 2019-03-05 MED ORDER — SODIUM CHLORIDE 0.9 % IV SOLN
Freq: Once | INTRAVENOUS | Status: AC
  Administered 2019-03-05: 09:00:00 via INTRAVENOUS
  Filled 2019-03-05: qty 250

## 2019-03-05 MED ORDER — SODIUM CHLORIDE 0.9 % IV SOLN
80.0000 mg/m2 | Freq: Once | INTRAVENOUS | Status: AC
  Administered 2019-03-05: 138 mg via INTRAVENOUS
  Filled 2019-03-05: qty 23

## 2019-03-05 MED ORDER — DEXAMETHASONE SODIUM PHOSPHATE 10 MG/ML IJ SOLN
4.0000 mg | Freq: Once | INTRAMUSCULAR | Status: AC
  Administered 2019-03-05: 4 mg via INTRAVENOUS

## 2019-03-05 NOTE — Telephone Encounter (Signed)
Per Cheryll Dessert to Pt to inform Pt about urinalysis results. Could not leave VM mailbox full.

## 2019-03-05 NOTE — Progress Notes (Signed)
[name]  Telephone:([phone]) [phone] [phone]) [phone]    ID: Manasi Dishon Foody DOB: Feb 11, 1971  MR#: [id number]  ASN#:[id number]  Patient Care Team: Orpah Melter, MD as PCP - General (Family Medicine) Mauro Kaufmann, RN as Oncology Nurse Navigator Rockwell Germany, RN as Oncology Nurse Navigator Jovita Kussmaul, MD as Consulting Physician (General Surgery) Magrinat, Virgie Dad, MD as Consulting Physician (Oncology) Kyung Rudd, MD as Consulting Physician (Radiation Oncology) Aloha Gell, MD as Consulting Physician (Obstetrics and Gynecology) Dillingham, Loel Lofty, DO as Attending Physician (Plastic Surgery) Scot Dock, NP OTHER MD:   CHIEF COMPLAINT: Estrogen receptor positive breast cancer  CURRENT TREATMENT: Adjuvant chemotherapy (weekly taxol)   INTERVAL HISTORY: Collyns returns today for follow-up and treatment of her estrogen receptor positive breast cancer.  She continues on adjuvant chemotherapy consisting of weekly paclitaxel x12. Today is day 1 cycle 8.   She is already scheduled for definite silicone implant placement mid September, about 10 days after her last paclitaxel treatment, which is adequate.   REVIEW OF SYSTEMS: Mark is doing well today.  She has some achiness in her joints.  She continues to have one ulcer on her inner lower lip.  She does not like the Valtrex she has been prescribed for this in the past, and has opted not to take it.  She notes that the rash that was on her face and worse two days ago is much improved over the past two weeks.  She is applying cortaid ointment to it.  She feels like she may be getting a urinary tract infection as she has noted a slight discomfort when urinating.    Kailan is mildly fatigued.  She is exercising by walking in the evenings. She has no peripheral neuropathy.  She is without fever or chills.  She has no nausea, vomiting, bowel/bladder changes.  She has no chest pain,  palpitations, cough, shortness of breath.  She has no headaches or vision changes.  A detailed ROS was otherwise non contributory.    HISTORY OF CURRENT ILLNESS: From the original intake note:  "Shakoya" presented with left nipple retraction for 1 week with retroareolar mass in the upper outer quadrant. She underwent bilateral diagnostic mammography with CAD and left breast ultrasonography at [facility] on 09/25/2018 showing: 7 mm oval duct in the left breast; no significant abnormalities in the left axilla. She also underwent additional imaging with left breast digital diagnostic mammogram on 10/02/2018 showing: new 0.4 cm cluster of grouped heterogeneous calcifications in the left breast.   Accordingly on 10/02/2018 she proceeded to biopsy of the left breast area in question. The pathology [id number]) from this procedure showed: mammary carcinoma in situ at the 3 o'clock mass, with possible focal microinvasion; invasive and in situ mammary carcinoma at the 12 o'clock mass, immunostain for E-cadherin is negative in the tumor cells, consistent with a lobular phenotype. Prognostic indicators significant for: estrogen receptor, 90% positive, with moderate staining intensity and progesterone receptor, 100% positive, with strong staining intensity. Proliferation marker Ki67 at <1%. HER2 negative (1+).   The patient's subsequent history is as detailed above.    PAST MEDICAL HISTORY: Past Medical History:  Diagnosis Date   Cancer ([place]) 10/18/2018   left breast cancer    PAST SURGICAL HISTORY: Past Surgical History:  Procedure Laterality Date   BREAST RECONSTRUCTION WITH PLACEMENT OF TISSUE EXPANDER AND FLEX HD (ACELLULAR HYDRATED DERMIS) Left 10/18/2018   Procedure: IMMEDIATEVBREAST RECONSTRUCTION WITH PLACEMENT OF TISSUE EXPANDER AND FLEX HD (ACELLULAR HYDRATED  DERMIS);  Surgeon: Wallace Going, DO;  Location: [place];  Service: Plastics;  Laterality: Left;   MASTECTOMY      MASTECTOMY W/ SENTINEL NODE BIOPSY Left 10/18/2018   Procedure: LEFT MASTECTOMY WITH SENTINEL LYMPH NODE BIOPSY;  Surgeon: Jovita Kussmaul, MD;  Location: [place];  Service: General;  Laterality: Left;   PORTACATH PLACEMENT Right 11/02/2018   Procedure: INSERTION PORT-A-CATH WITH ULTRASOUND;  Surgeon: Jovita Kussmaul, MD;  Location: [place];  Service: General;  Laterality: Right;    FAMILY HISTORY Family History  Problem Relation Age of Onset   Breast cancer Neg Hx    Ovarian cancer Neg Hx    As of March 2020, patient's father is living and healthy at age 26. Patient's mother is also living and healthy at age 39. The patient denies a family hx of breast or ovarian cancer. She has 1 sister.  GYNECOLOGIC HISTORY:  No LMP recorded. (Menstrual status: IUD). Menarche: 48 years old Age at first live birth: 48 years old [place] 2 LMP in 2010 Contraceptive: Mirena IUD has been replaced by a ParaGard IUD as of March 2020 She used birth control pills between ages 22 to 72 w/o event HRT n/a  Hysterectomy? no BSO? 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  She has a PhD in Probation officer.  Husband [name] is an Art gallery manager. She lives at home with her husband and 3 children. She has two children, [name] age 4 and [name] age 70. Husband [name] has one daughter, Adriahna Shearman age 7, who lives with them.    ADVANCED DIRECTIVES: In the absence of any documentation to the contrary her husband [name] is her HCPOA.   HEALTH MAINTENANCE: Social History   Tobacco Use   Smoking status: Never Smoker   Smokeless tobacco: Never Used  Substance Use Topics   Alcohol use: Yes    Alcohol/week: 3.0 standard drinks    Types: 3 Standard drinks or equivalent per week    Comment: social   Drug use: Never     Colonoscopy: never done  PAP: 08/2017  Bone density: never done   Allergies  Allergen Reactions     Septra [Sulfamethoxazole-Trimethoprim]     Current Outpatient Medications  Medication Sig Dispense Refill   diazepam (VALIUM) 2 MG tablet Take 1 tablet (2 mg total) by mouth every 6 (six) hours as needed for anxiety. 30 tablet 0   lidocaine-prilocaine (EMLA) cream Apply to affected area once 30 g 3   loratadine (CLARITIN) 10 MG tablet Take 1 tablet (10 mg total) by mouth daily. 90 tablet 0   LORazepam (ATIVAN) 0.5 MG tablet Take 1 tablet (0.5 mg total) by mouth at bedtime as needed (Nausea or vomiting). 30 tablet 0   predniSONE (DELTASONE) 5 MG tablet Take 6 tablets with breakfast beginning 02/13/2019, then 5 tablets the next day, 4 tablets the next and so on until done 21 tablet 0   prochlorperazine (COMPAZINE) 10 MG tablet Take 1 tablet (10 mg total) by mouth every 6 (six) hours as needed (Nausea or vomiting). 30 tablet 1   valACYclovir (VALTREX) 1000 MG tablet Take 1 tablet (1,000 mg total) by mouth daily. 10 tablet 0   No current facility-administered medications for this visit.     OBJECTIVE:  Vitals:   03/05/19 0835  BP: 121/66  Pulse: 89  Resp: 18  Temp: 98.3 F (36.8 C)  SpO2: 100%     Body mass index is 24.05 kg/m.   Wt Readings from Last 3 Encounters:  03/05/19 149 lb (67.6 kg)  02/19/19 145 lb 8 oz (66 kg)  02/12/19 144 lb 8 oz (65.5 kg)  ECOG FS:1 - Symptomatic but completely ambulatory GENERAL: Patient is a well appearing female in no acute distress HEENT:  Sclerae anicteric.  Oropharynx clear and moist. No ulcerations or evidence of oropharyngeal candidiasis. Neck is supple.  NODES:  No cervical, supraclavicular, or axillary lymphadenopathy palpated.  BREAST EXAM:  Deferred. LUNGS:  Clear to auscultation bilaterally.  No wheezes or rhonchi. HEART:  Regular rate and rhythm. No murmur appreciated. ABDOMEN:  Soft, nontender.  Positive, normoactive bowel sounds. No organomegaly palpated. MSK:  No focal spinal tenderness to palpation. Full range of motion  bilaterally in the upper extremities. EXTREMITIES:  No peripheral edema.   SKIN:  Facial erythema is improved from prior.  + darkening on the nail beds of the first digits bilaterally of hands.   NEURO:  Nonfocal. Well oriented.  Appropriate affect.       LAB RESULTS:  CMP     Component Value Date/Time   NA 142 03/05/2019 0826   K 4.1 03/05/2019 0826   CL 109 03/05/2019 0826   CO2 23 03/05/2019 0826   GLUCOSE 105 (H) 03/05/2019 0826   BUN 16 03/05/2019 0826   CREATININE 0.74 03/05/2019 0826   CREATININE 0.75 12/01/2018 0808   CALCIUM 8.7 (L) 03/05/2019 0826   PROT 6.1 (L) 03/05/2019 0826   ALBUMIN 3.6 03/05/2019 0826   AST 42 (H) 03/05/2019 0826   AST 13 (L) 12/01/2018 0808   ALT 86 (H) 03/05/2019 0826   ALT 21 12/01/2018 0808   ALKPHOS 42 03/05/2019 0826   BILITOT 0.2 (L) 03/05/2019 0826   BILITOT <0.2 (L) 12/01/2018 0808   GFRNONAA >60 03/05/2019 0826   GFRNONAA >60 12/01/2018 0808   GFRAA >60 03/05/2019 0826   GFRAA >60 12/01/2018 0808    No results found for: TOTALPROTELP, ALBUMINELP, A1GS, A2GS, BETS, BETA2SER, GAMS, MSPIKE, SPEI  No results found for: KPAFRELGTCHN, LAMBDASER, KAPLAMBRATIO  Lab Results  Component Value Date   WBC 2.9 (L) 03/05/2019   NEUTROABS 1.7 03/05/2019   HGB 11.3 (L) 03/05/2019   HCT 34.9 (L) 03/05/2019   MCV 102.0 (H) 03/05/2019   PLT 277 03/05/2019    No results found for: LABCA2  No components found for: ZYSAYT016  No results for input(s): INR in the last 168 hours.  No results found for: LABCA2  No results found for: WFU932  No results found for: TFT732  No results found for: KGU542  No results found for: CA2729  No components found for: HGQUANT  No results found for: CEA1 / No results found for: CEA1   No results found for: AFPTUMOR  No results found for: CHROMOGRNA  No results found for: PSA1  Appointment on 03/05/2019  Component Date Value Ref Range Status   Sodium 03/05/2019 142  135 - 145 mmol/L  Final   Potassium 03/05/2019 4.1  3.5 - 5.1 mmol/L Final   Chloride 03/05/2019 109  98 - 111 mmol/L Final   CO2 03/05/2019 23  22 - 32 mmol/L Final   Glucose, Bld 03/05/2019 105* 70 - 99 mg/dL Final   BUN 03/05/2019 16  6 - 20 mg/dL Final   Creatinine, Ser 03/05/2019 0.74  0.44 - 1.00 mg/dL Final   Calcium 03/05/2019 8.7* 8.9 - 10.3 mg/dL Final  Total Protein 03/05/2019 6.1* 6.5 - 8.1 g/dL Final   Albumin 03/05/2019 3.6  3.5 - 5.0 g/dL Final   AST 03/05/2019 42* 15 - 41 U/L Final   ALT 03/05/2019 86* 0 - 44 U/L Final   Alkaline Phosphatase 03/05/2019 42  38 - 126 U/L Final   Total Bilirubin 03/05/2019 0.2* 0.3 - 1.2 mg/dL Final   GFR calc non Af Amer 03/05/2019 >60  >60 mL/min Final   GFR calc Af Amer 03/05/2019 >60  >60 mL/min Final   Anion gap 03/05/2019 10  5 - 15 Final   Performed at [facility], [place] [address] [place], [place] [address]   WBC 03/05/2019 2.9* 4.0 - 10.5 K/uL Final   RBC 03/05/2019 3.42* 3.87 - 5.11 MIL/uL Final   Hemoglobin 03/05/2019 11.3* 12.0 - 15.0 g/dL Final   HCT 03/05/2019 34.9* 36.0 - 46.0 % Final   MCV 03/05/2019 102.0* 80.0 - 100.0 fL Final   MCH 03/05/2019 33.0  26.0 - 34.0 pg Final   MCHC 03/05/2019 32.4  30.0 - 36.0 g/dL Final   RDW 03/05/2019 14.3  11.5 - 15.5 % Final   Platelets 03/05/2019 277  150 - 400 K/uL Final   nRBC 03/05/2019 0.0  0.0 - 0.2 % Final   Neutrophils Relative % 03/05/2019 58  % Final   Neutro Abs 03/05/2019 1.7  1.7 - 7.7 K/uL Final   Lymphocytes Relative 03/05/2019 28  % Final   Lymphs Abs 03/05/2019 0.8  0.7 - 4.0 K/uL Final   Monocytes Relative 03/05/2019 9  % Final   Monocytes Absolute 03/05/2019 0.3  0.1 - 1.0 K/uL Final   Eosinophils Relative 03/05/2019 3  % Final   Eosinophils Absolute 03/05/2019 0.1  0.0 - 0.5 K/uL Final   Basophils Relative 03/05/2019 1  % Final   Basophils Absolute 03/05/2019 0.0  0.0 - 0.1 K/uL Final   Immature Granulocytes  03/05/2019 1  % Final   Abs Immature Granulocytes 03/05/2019 0.02  0.00 - 0.07 K/uL Final   Performed at [facility], [address] [place],  [address]    (this displays the last labs from the last 3 days)  No results found for: TOTALPROTELP, ALBUMINELP, A1GS, A2GS, BETS, BETA2SER, GAMS, MSPIKE, SPEI (this displays SPEP labs)  No results found for: KPAFRELGTCHN, LAMBDASER, KAPLAMBRATIO (kappa/lambda light chains)  No results found for: HGBA, HGBA2QUANT, HGBFQUANT, HGBSQUAN (Hemoglobinopathy evaluation)   No results found for: LDH  No results found for: IRON, TIBC, IRONPCTSAT (Iron and TIBC)  No results found for: FERRITIN  Urinalysis    Component Value Date/Time   COLORURINE YELLOW 01/27/2008 0235   APPEARANCEUR CLOUDY (A) 01/27/2008 0235   LABSPEC 1.027 01/27/2008 0235   PHURINE 7.0 01/27/2008 0235   GLUCOSEU NEGATIVE 01/27/2008 0235   HGBUR NEGATIVE 01/27/2008 0235   BILIRUBINUR NEGATIVE 01/27/2008 0235   KETONESUR NEGATIVE 01/27/2008 0235   PROTEINUR NEGATIVE 01/27/2008 0235   UROBILINOGEN 0.2 01/27/2008 0235   NITRITE NEGATIVE 01/27/2008 0235   LEUKOCYTESUR SMALL (A) 01/27/2008 0235     STUDIES: No results found.   ELIGIBLE FOR AVAILABLE RESEARCH PROTOCOL: no   ASSESSMENT: 48 y.o. [facility] woman status post left breast upper outer quadrant biopsy 10/02/2018 for a clinical T2N0, stage IB invasive lobular breast cancer, grade not stated, estrogen and progesterone receptor positive, HER-2 not amplified, with an MIB-1 of less than 1%  (1) status post left mastectomy and sentinel lymph node sampling 10/18/2018 for a pT3  pN2, stage IIA invasive lobular breast cancer, grade 2, with close but negative margins  (a) 5 of 9 lymph nodes removed had micrometastatic deposits of tumor  (b) immediate expander placement  (2) chemotherapy consisting of doxorubicin and cyclophosphamide in dose dense fashion x4 started 11/22/2018, completed  01/02/2019, followed by weekly paclitaxel x12 starting 01/15/2019  (3) adjuvant radiation to follow  (4) antiestrogens to start at the completion of local treatment  (a) consider goserelin/anastrozole   PLAN: Vinetta is doing well today.  She continues on weekly Paclitaxel.  She is starting to develop more symptoms as we near the end.  These are all mild and tolerable, and she has not yet developed signs of peripheral neuropathy.  She will proceed with #8 of weekly Paclitaxel today.  [name] and I reviewed her rash.  She will continue to apply the topical cortaid ointment and avoid sun.   Chrislynn will leave [place] a urine sample for her increased mild urinary discomfort.  We will run a urinalysis and culture.  I sent in Ulysses for her to take 5 ml sw and spit for her oral ulcer.  I encouraged her to continue with exercise, and that as she completes her chemotherapy and her fatigue decreases, she will become more active which can help with her getting back to her normal weight.  We will now see her on a weekly basis.  She will return in 1 week for labs, f/u, and her next treatment.  She was recommended to continue with the appropriate pandemic precautions. She knows to call for any questions that may arise between now and her next appointment.  We are happy to see her sooner if needed.   A total of (30) minutes of face-to-face time was spent with this patient with greater than 50% of that time in counseling and care-coordination.   Wilber Bihari, NP   03/05/19 9:01 AM Medical Oncology and Hematology [facility] [address] [place], [place] [address] Tel. [phone]    Fax. [phone]  Addendum: Nattie's ALT is 86.  She is ok to proceed with treatment today and we will get CMP stat from here on out, so it will hopefully return prior to her provider appointment completion.

## 2019-03-05 NOTE — Progress Notes (Signed)
Location of Breast Cancer:Malignant neoplasm of upper outer quadrant of left breast in female, + + -  Plan: She plans to have her expander removed and implant placed on 9/24.  She will be seen 2-3 weeks  post surgery for planning session and treatment start 4-4.5 weeks post surgery to ensure adequate healing. -6.5 weeks of treatments.  Did patient present with symptoms (if so, please note symptoms) or was this found on screening mammography?: She was having left nipple retraction for 1 week with retro-areolar mass in the upper outer quadrant.  Diagnostic mammogram 10/02/2018: new 0.4 cm cluster of grouped heterogeneous calcifications in the left breast.  Diagnostic mammogram 09/25/2018: 7 mm oval duct in the left breast; no significant abnormalities in the left axilla.  Histology per Pathology Report: Left Breast Mastectomy 10/18/2018    Histology per Pathology Report: Left Breast 10/02/2018  Receptor Status: ER(90% +), PR (100% +), Her2-neu (-), Ki-67(<1%)     Past/Anticipated interventions by surgeon, if any: Dr. Marlou Starks  -Left Mastectomy with sentinel lymph node biopsy 10/18/2018, clear margins. -Insertion of port-a-cath 11/02/2018 -5/9 nodes removed had micrometastatic deposits of tumor. -Removal of tissue expander and placement of implant 04/12/2019 with Dr. [name]   Past/Anticipated interventions by medical oncology, if any: Chemotherapy  Dr. Jana Hakim 02/19/2019 [name] continues to tolerate paclitaxel generally well and she will proceed to cycle #6 of 12 planned today. -The one issues she is having is the rash on her face.  She is going to try cortaid topically as we do not want to continue systemic steroids too long.  I offered to give her a one-week breast on her chemo but she really wants to get it done. -She is scheduled for implant placement mid-September.  Likely she will start adjuvant radiation sometime in October. -She has not met with our radiation doctor and we will try to  schedule that for her 1 of the days that she comes for Taxol treatment to "get it moving". Plan  (1) status post left mastectomy and sentinel lymph node sampling 10/18/2018 for a pT3 pN2, stage IIA invasive lobular breast cancer, grade 2, with close but negative margins             (a) 5 of 9 lymph nodes removed had micrometastatic deposits of tumor             (b) immediate expander placement  (2) chemotherapy consisting of doxorubicin and cyclophosphamide in dose dense fashion x4 started 11/22/2018, completed 01/02/2019, followed by weekly paclitaxel x12 starting 01/15/2019  (3) adjuvant radiation to follow  (4) antiestrogens to start at the completion of local treatment             (a) consider goserelin/anastrozole   Lymphedema issues, if any:  No  Pain issues, if any:  No breast pains, she complains of joint pain due to neulasta.  SAFETY ISSUES:  Prior radiation? No   Pacemaker/ICD? No  Possible current pregnancy? IUD  Is the patient on methotrexate? No  Current Complaints / other details:      Cori Razor, RN 03/05/2019,7:31 AM

## 2019-03-05 NOTE — Telephone Encounter (Signed)
Pt. Returned call and urinalysis results given. Pt. Verbalized understanding. No further problems or concerns noted.

## 2019-03-05 NOTE — Patient Instructions (Signed)
[facility] Discharge Instructions for Patients Receiving Chemotherapy  Today you received the following chemotherapy agents:  Taxol.  To help prevent nausea and vomiting after your treatment, we encourage you to take your nausea medication as directed.   If you develop nausea and vomiting that is not controlled by your nausea medication, call the clinic.   BELOW ARE SYMPTOMS THAT SHOULD BE REPORTED IMMEDIATELY:  *FEVER GREATER THAN 100.5 F  *CHILLS WITH OR WITHOUT FEVER  NAUSEA AND VOMITING THAT IS NOT CONTROLLED WITH YOUR NAUSEA MEDICATION  *UNUSUAL SHORTNESS OF BREATH  *UNUSUAL BRUISING OR BLEEDING  TENDERNESS IN MOUTH AND THROAT WITH OR WITHOUT PRESENCE OF ULCERS  *URINARY PROBLEMS  *BOWEL PROBLEMS  UNUSUAL RASH Items with * indicate a potential emergency and should be followed up as soon as possible.  Feel free to call the clinic should you have any questions or concerns. The clinic phone number is [phone].  Please show the CHEMO ALERT CARD at check-in to the Emergency Department and triage nurse.   

## 2019-03-06 ENCOUNTER — Other Ambulatory Visit: Payer: Self-pay

## 2019-03-06 ENCOUNTER — Ambulatory Visit
Admission: RE | Admit: 2019-03-06 | Discharge: 2019-03-06 | Disposition: A | Discharge status: Home / Self Care | Payer: 59 | Source: Ambulatory Visit | Attending: Radiation Oncology | Admitting: Radiation Oncology

## 2019-03-06 ENCOUNTER — Telehealth: Payer: Self-pay

## 2019-03-06 ENCOUNTER — Encounter: Payer: Self-pay | Admitting: Radiation Oncology

## 2019-03-06 VITALS — Ht 66.0 in | Wt 149.0 lb

## 2019-03-06 DIAGNOSIS — Z170 Estrogen receptor positive status [ER+]: Secondary | ICD-10-CM

## 2019-03-06 DIAGNOSIS — C50412 Malignant neoplasm of upper-outer quadrant of left female breast: Secondary | ICD-10-CM

## 2019-03-06 LAB — URINE CULTURE

## 2019-03-06 NOTE — Telephone Encounter (Signed)
Spoke with Pt per Nicole Ryder NP she stated she is not having anymore urinary issues. [name] notified

## 2019-03-06 NOTE — Telephone Encounter (Signed)
-----   Message from Gardenia Phlegm, NP sent at 03/06/2019 12:29 PM EDT ----- Please call patient and let her know that urine doesn't show infection.  How is she doing?  Any more urinary symptoms? ----- Message ----- From: Buel Ream, Lab In [place] Sent: 03/05/2019   9:46 AM EDT To: Gardenia Phlegm, NP

## 2019-03-08 NOTE — Progress Notes (Addendum)
Radiation Oncology         ([phone]) [phone] ________________________________  Name: [name] MRN: [id number]  Date: 03/06/2019  DOB: [birth year]  TS:[id number], Annie Main, MD  Magrinat, Virgie Dad, MD     REFERRING PHYSICIAN: Magrinat, Virgie Dad, MD   DIAGNOSIS: The encounter diagnosis was Malignant neoplasm of upper-outer quadrant of left breast in female, estrogen receptor positive ([place]).   HISTORY OF PRESENT ILLNESS::[name] is a 48 y.o. female who is seen for an initial consultation visit regarding the patient's diagnosis of left-sided breast cancer.  Due to the coronavirus pandemic, this was completed with telemedicine with video conference.  The patient initially presented with left nipple retraction and this was found to be associated with a retroareolar mass in the upper outer quadrant.  The patient proceeded with diagnostic mammography as well as left breast ultrasound which was completed on 09/25/2018.  This revealed a 0.7 cm suspicious area in the left breast with no axillary adenopathy noted on this side.  Mammography also showed a new 0.4 cm grouped area of heterogeneous calcifications within the left breast.  Patient therefore proceeded with biopsy which revealed invasive mammary carcinoma in situ with possible focal microinvasion.  This was present at the 3:00 mass.  A 12:00 mass revealed invasive and in situ mammary carcinoma consistent with lobular phenotype.  Receptor studies indicated that the tumor was ER positive, PR positive, HER-2 negative and the Ki-67 staining was less than 1%.  The patient proceeded with a left sided mastectomy which returned positive for a grade 2 invasive lobular carcinoma measuring 7.8 cm.  Margins were negative with the closest margin being less than 0.1 cm anteriorly and inferiorly.  5 out of 9 lymph nodes were positive.  The patient underwent reconstructive surgery at that time with an expander placed.  She has undergone postoperative  chemotherapy and is scheduled to have her implant placed on 04/12/2019 after the completion of her chemotherapy.  I have therefore been asked to see the patient for consideration of adjuvant radiation treatment.    PREVIOUS RADIATION THERAPY: No   PAST MEDICAL HISTORY:  has a past medical history of Cancer ([place]) (10/18/2018).     PAST SURGICAL HISTORY: Past Surgical History:  Procedure Laterality Date   BREAST RECONSTRUCTION WITH PLACEMENT OF TISSUE EXPANDER AND FLEX HD (ACELLULAR HYDRATED DERMIS) Left 10/18/2018   Procedure: IMMEDIATEVBREAST RECONSTRUCTION WITH PLACEMENT OF TISSUE EXPANDER AND FLEX HD (ACELLULAR HYDRATED DERMIS);  Surgeon: Wallace Going, DO;  Location: [facility];  Service: Plastics;  Laterality: Left;   MASTECTOMY     MASTECTOMY W/ SENTINEL NODE BIOPSY Left 10/18/2018   Procedure: LEFT MASTECTOMY WITH SENTINEL LYMPH NODE BIOPSY;  Surgeon: Jovita Kussmaul, MD;  Location: [facility];  Service: General;  Laterality: Left;   PORTACATH PLACEMENT Right 11/02/2018   Procedure: INSERTION PORT-A-CATH WITH ULTRASOUND;  Surgeon: Jovita Kussmaul, MD;  Location: [facility];  Service: General;  Laterality: Right;     FAMILY HISTORY: family history is not on file.   SOCIAL HISTORY:  reports that she has never smoked. She has never used smokeless tobacco. She reports current alcohol use of about 3.0 standard drinks of alcohol per week. She reports that she does not use drugs.   ALLERGIES: Septra [sulfamethoxazole-trimethoprim]   MEDICATIONS:  Current Outpatient Medications  Medication Sig Dispense Refill   diazepam (VALIUM) 2 MG tablet Take 1 tablet (2 mg total) by mouth every 6 (six) hours as needed for  anxiety. 30 tablet 0   lidocaine-prilocaine (EMLA) cream Apply to affected area once 30 g 3   loratadine (CLARITIN) 10 MG tablet Take 1 tablet (10 mg total) by mouth daily. 90 tablet 0   LORazepam (ATIVAN) 0.5 MG tablet Take  1 tablet (0.5 mg total) by mouth at bedtime as needed (Nausea or vomiting). 30 tablet 0   magic mouthwash w/lidocaine SOLN Take 5 mLs by mouth 4 (four) times daily as needed for mouth pain. 240 mL 0   predniSONE (DELTASONE) 5 MG tablet Take 6 tablets with breakfast beginning 02/13/2019, then 5 tablets the next day, 4 tablets the next and so on until done 21 tablet 0   prochlorperazine (COMPAZINE) 10 MG tablet Take 1 tablet (10 mg total) by mouth every 6 (six) hours as needed (Nausea or vomiting). 30 tablet 1   valACYclovir (VALTREX) 1000 MG tablet Take 1 tablet (1,000 mg total) by mouth daily. 10 tablet 0   No current facility-administered medications for this encounter.      REVIEW OF SYSTEMS:  A 15 point review of systems is documented in the electronic medical record. This was obtained by the nursing staff. However, I reviewed this with the patient to discuss relevant findings and make appropriate changes.  Pertinent items are noted in HPI.    PHYSICAL EXAM:  height is '5\' 6"'  (1.676 m) and weight is 149 lb (67.6 kg).   ECOG = 1  0 - Asymptomatic (Fully active, able to carry on all predisease activities without restriction)  1 - Symptomatic but completely ambulatory (Restricted in physically strenuous activity but ambulatory and able to carry out work of a light or sedentary nature. For example, light housework, office work)  2 - Symptomatic, <50% in bed during the day (Ambulatory and capable of all self care but unable to carry out any work activities. Up and about more than 50% of waking hours)  3 - Symptomatic, >50% in bed, but not bedbound (Capable of only limited self-care, confined to bed or chair 50% or more of waking hours)  4 - Bedbound (Completely disabled. Cannot carry on any self-care. Totally confined to bed or chair)  5 - Death   Eustace Pen MM, Creech RH, Tormey DC, et al. [phone]). "Toxicity and response criteria of the [facility]". [place] Oncol.  5 (6): 649-55  Alert, no acute distress   LABORATORY DATA:  Lab Results  Component Value Date   WBC 2.9 (L) 03/05/2019   HGB 11.3 (L) 03/05/2019   HCT 34.9 (L) 03/05/2019   MCV 102.0 (H) 03/05/2019   PLT 277 03/05/2019   Lab Results  Component Value Date   NA 142 03/05/2019   K 4.1 03/05/2019   CL 109 03/05/2019   CO2 23 03/05/2019   Lab Results  Component Value Date   ALT 86 (H) 03/05/2019   AST 42 (H) 03/05/2019   ALKPHOS 42 03/05/2019   BILITOT 0.2 (L) 03/05/2019      RADIOGRAPHY: No results found.     IMPRESSION/ PLAN:  Staging:  ILC left breast [id number] The patient is a very pleasant 48 year old female who is now status post a left-sided mastectomy.  This revealed a 7.8 cm invasive lobular carcinoma and 5 lymph nodes were positive.  She is nearing completion of adjuvant chemotherapy.  She had an expander placed at the time of her initial surgery and is scheduled to have the implant placed on 04/12/2019.  The patient is an appropriate candidate for  postmastectomy radiation treatment at the appropriate time, after her upcoming reconstructive surgery.  I discussed the rationale of this treatment with the patient, discussing treatment to the left breast region as well as the regional lymph nodes.  We discussed the expected improvement in local/regional control.  We also discussed the possible side effects and risks of treatment.  All of her questions were answered and she does wish to proceed with this treatment at the appropriate time.  We will plan to see the patient therefore 2 to 3 weeks after her upcoming surgery in September to review how she is doing at that time and to plan for and anticipated course of radiation treatment for 6-1/2 weeks.    Due to the coronavirus pandemic, this encounter was provided by telemedicine platform MyChart.  The patient has given verbal consent for this type of encounter and has been advised to only accept a meeting of this type in a secure  network environment. The time spent during this encounter was 30 minutes and more than 50% of that time was spent in the coordination of the patient's care. The attendants for this meeting include  Dr. Lisbeth Renshaw and Bonney Leitz.  During the encounter,  Dr. Lisbeth Renshaw, and was located at [facility] Radiation Oncology Department.  The patient was located at home.       ________________________________   Jodelle Gross, MD, PhD   **Disclaimer: This note was dictated with voice recognition software. Similar sounding words can inadvertently be transcribed and this note may contain transcription errors which may not have been corrected upon publication of note.**

## 2019-03-09 NOTE — Progress Notes (Deleted)
[place]  [phone] [phone]    ID: [name] DOB: [birth year]  MR#: [id number]  XQJ#:[id number]  Patient Care Team: Orpah Melter, MD as PCP - General (Family Medicine) Mauro Kaufmann, RN as Oncology Nurse Navigator Rockwell Germany, RN as Oncology Nurse Navigator Jovita Kussmaul, MD as Consulting Physician (General Surgery) Magrinat, Virgie Dad, MD as Consulting Physician (Oncology) Kyung Rudd, MD as Consulting Physician (Radiation Oncology) Aloha Gell, MD as Consulting Physician (Obstetrics and Gynecology) Dillingham, Loel Lofty, DO as Attending Physician (Plastic Surgery) [place] MD:   CHIEF COMPLAINT: Estrogen receptor positive breast cancer  CURRENT TREATMENT: Adjuvant chemotherapy (weekly taxol)   INTERVAL HISTORY: [name] returns today for follow-up and treatment of her estrogen receptor positive breast cancer. She was last seen on ***. She is accompanied by ***.  She continues on ***. ***  {Bone density (anastrozole/Arimidex, letrozole/Femara, examestane/Aromasin) Echocardiogram (trastuzumab/Herceptin, lapatinib/Tykerb, T-Dm1/) - Check for next appointment, completed every 3-6 months}  Since *** last visit here, ***   {Avo returns today for follow-up and treatment of her estrogen receptor positive breast cancer.  She continues on adjuvant chemotherapy consisting of weekly paclitaxel x12. Today is day 1 cycle 8.   She is already scheduled for definite silicone implant placement mid September, about 10 days after her last paclitaxel treatment, which is adequate.}   REVIEW OF SYSTEMS: [name] ***. ***. ***.  The patient denies unusual headaches, visual changes, nausea, vomiting, or dizziness. There has been no unusual cough, phlegm production, or pleurisy. This been no change in bowel or bladder habits. The patient denies unexplained fatigue or unexplained weight loss, bleeding, rash, or fever.  A detailed review of systems was otherwise noncontributory.   {[name] is doing well today.  She has some achiness in her joints.  She continues to have one ulcer on her inner lower lip.  She does not like the Valtrex she has been prescribed for this in the past, and has opted not to take it.  She notes that the rash that was on her face and worse two days ago is much improved over the past two weeks.  She is applying cortaid ointment to it.  She feels like she may be getting a urinary tract infection as she has noted a slight discomfort when urinating.    [name] is mildly fatigued.  She is exercising by walking in the evenings. She has no peripheral neuropathy.  She is without fever or chills.  She has no nausea, vomiting, bowel/bladder changes.  She has no chest pain, palpitations, cough, shortness of breath.  She has no headaches or vision changes.  A detailed ROS was otherwise non contributory.  }  HISTORY OF CURRENT ILLNESS: From the original intake note:  "[name]" presented with left nipple retraction for 1 week with retroareolar mass in the upper outer quadrant. She underwent bilateral diagnostic mammography with CAD and left breast ultrasonography at [facility] on 09/25/2018 showing: 7 mm oval duct in the left breast; no significant abnormalities in the left axilla. She also underwent additional imaging with left breast digital diagnostic mammogram on 10/02/2018 showing: new 0.4 cm cluster of grouped heterogeneous calcifications in the left breast.   Accordingly on 10/02/2018 she proceeded to biopsy of the left breast area in question. 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 Prognostic indicators significant for: estrogen receptor, 90% positive,  with moderate staining intensity and progesterone receptor, 100% positive, with strong staining intensity. Proliferation marker Ki67 at <1%. HER2 negative (1+).   The patient's subsequent history is as detailed above.    PAST MEDICAL HISTORY: Past Medical History:  Diagnosis Date   Cancer ([place]) 10/18/2018   left breast cancer    PAST SURGICAL HISTORY: Past Surgical History:  Procedure Laterality Date   BREAST RECONSTRUCTION WITH PLACEMENT OF TISSUE EXPANDER AND FLEX HD (ACELLULAR HYDRATED DERMIS) Left 10/18/2018   Procedure: IMMEDIATEVBREAST RECONSTRUCTION WITH PLACEMENT OF TISSUE EXPANDER AND FLEX HD (ACELLULAR HYDRATED DERMIS);  Surgeon: Wallace Going, DO;  Location: [place];  Service: Plastics;  Laterality: Left;   MASTECTOMY     MASTECTOMY W/ SENTINEL NODE BIOPSY Left 10/18/2018   Procedure: LEFT MASTECTOMY WITH SENTINEL LYMPH NODE BIOPSY;  Surgeon: Jovita Kussmaul, MD;  Location: [place];  Service: General;  Laterality: Left;   PORTACATH PLACEMENT Right 11/02/2018   Procedure: INSERTION PORT-A-CATH WITH ULTRASOUND;  Surgeon: Jovita Kussmaul, MD;  Location: [place];  Service: General;  Laterality: Right;    FAMILY HISTORY Family History  Problem Relation Age of Onset   Breast cancer Neg Hx    Ovarian cancer Neg Hx    As of March 2020, patient's father is living and healthy at age 47. Patient's mother is also living and healthy at age 16. The patient denies a family hx of breast or ovarian cancer. She has 1 sister.  GYNECOLOGIC HISTORY:  No LMP recorded. (Menstrual status: IUD). Menarche: 48 years old Age at first live birth: 48 years old [place] 2 LMP in 2010 Contraceptive: Mirena IUD has been replaced by a ParaGard IUD as of March 2020 She used birth control pills between ages 78 to 67 w/o event HRT n/a  Hysterectomy? no BSO? 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  She has a PhD in Probation officer.  Husband [name] is an Art gallery manager. She lives at home with her husband and 3 children. She has two children, [name] age 76 and [name] age 68. Husband [name] has one daughter, Oyinkansola Truax age 60, who lives with them.   ADVANCED DIRECTIVES: In the absence of any documentation to the contrary her husband [name] is her HCPOA.   HEALTH MAINTENANCE: Social History   Tobacco Use   Smoking status: Never Smoker   Smokeless tobacco: Never Used  Substance Use Topics   Alcohol use: Yes    Alcohol/week: 3.0 standard drinks    Types: 3 Standard drinks or equivalent per week    Comment: social   Drug use: Never     Colonoscopy: never done  PAP: 08/2017  Bone density: never done   Allergies  Allergen Reactions   Septra [Sulfamethoxazole-Trimethoprim]     Current Outpatient Medications  Medication Sig Dispense Refill   diazepam (VALIUM) 2 MG tablet Take 1 tablet (2 mg total) by mouth every 6 (six) hours as needed for anxiety. 30 tablet 0   lidocaine-prilocaine (EMLA) cream Apply to affected area once 30 g 3   loratadine (CLARITIN) 10 MG tablet Take 1 tablet (10 mg total) by mouth daily. 90 tablet 0   LORazepam (ATIVAN) 0.5 MG tablet Take 1 tablet (0.5 mg total) by mouth at bedtime as needed (Nausea or vomiting). 30 tablet 0  magic mouthwash w/lidocaine SOLN Take 5 mLs by mouth 4 (four) times daily as needed for mouth pain. 240 mL 0   predniSONE (DELTASONE) 5 MG tablet Take 6 tablets with breakfast beginning 02/13/2019, then 5 tablets the next day, 4 tablets the next and so on until done 21 tablet 0   prochlorperazine (COMPAZINE) 10 MG tablet Take 1 tablet (10 mg total) by mouth every 6 (six) hours as needed (Nausea or vomiting). 30 tablet 1   valACYclovir (VALTREX) 1000 MG tablet Take 1 tablet (1,000 mg total) by mouth daily. 10 tablet 0   No current facility-administered medications for this visit.     OBJECTIVE:    There were no vitals filed for this visit.   There is no height or weight on file to calculate BMI.   Wt Readings from Last 3 Encounters:  03/06/19 149 lb (67.6 kg)  03/05/19 149 lb (67.6 kg)  02/19/19 145 lb 8 oz (66 kg)  ECOG FS:1 - Symptomatic but completely ambulatory  Sclerae unicteric, EOMs intact Wearing a mask No cervical or supraclavicular adenopathy Lungs no rales or rhonchi Heart regular rate and rhythm Abd soft, nontender, positive bowel sounds MSK no focal spinal tenderness, no upper extremity lymphedema Neuro: nonfocal, well oriented, appropriate affect Breasts: Deferred  {GENERAL: Patient is a well appearing female in no acute distress HEENT:  Sclerae anicteric.  Oropharynx clear and moist. No ulcerations or evidence of oropharyngeal candidiasis. Neck is supple.  NODES:  No cervical, supraclavicular, or axillary lymphadenopathy palpated.  BREAST EXAM:  Deferred. LUNGS:  Clear to auscultation bilaterally.  No wheezes or rhonchi. HEART:  Regular rate and rhythm. No murmur appreciated. ABDOMEN:  Soft, nontender.  Positive, normoactive bowel sounds. No organomegaly palpated. MSK:  No focal spinal tenderness to palpation. Full range of motion bilaterally in the upper extremities. EXTREMITIES:  No peripheral edema.   SKIN:  Facial erythema is improved from prior.  + darkening on the nail beds of the first digits bilaterally of hands.   NEURO:  Nonfocal. Well oriented.  Appropriate affect.}   LAB RESULTS:  CMP     Component Value Date/Time   NA 142 03/05/2019 0826   K 4.1 03/05/2019 0826   CL 109 03/05/2019 0826   CO2 23 03/05/2019 0826   GLUCOSE 105 (H) 03/05/2019 0826   BUN 16 03/05/2019 0826   CREATININE 0.74 03/05/2019 0826   CREATININE 0.75 12/01/2018 0808   CALCIUM 8.7 (L) 03/05/2019 0826   PROT 6.1 (L) 03/05/2019 0826   ALBUMIN 3.6 03/05/2019 0826   AST 42 (H) 03/05/2019 0826   AST 13 (L) 12/01/2018 0808   ALT 86 (H) 03/05/2019 0826   ALT 21  12/01/2018 0808   ALKPHOS 42 03/05/2019 0826   BILITOT 0.2 (L) 03/05/2019 0826   BILITOT <0.2 (L) 12/01/2018 0808   GFRNONAA >60 03/05/2019 0826   GFRNONAA >60 12/01/2018 0808   GFRAA >60 03/05/2019 0826   GFRAA >60 12/01/2018 0808    No results found for: TOTALPROTELP, ALBUMINELP, A1GS, A2GS, BETS, BETA2SER, GAMS, MSPIKE, SPEI  No results found for: KPAFRELGTCHN, LAMBDASER, KAPLAMBRATIO  Lab Results  Component Value Date   WBC 2.9 (L) 03/05/2019   NEUTROABS 1.7 03/05/2019   HGB 11.3 (L) 03/05/2019   HCT 34.9 (L) 03/05/2019   MCV 102.0 (H) 03/05/2019   PLT 277 03/05/2019    No results found for: LABCA2  No components found for: HALPFX902  No results for input(s): INR in the last 168 hours.  No results  found for: LABCA2  No results found for: UMP536  No results found for: RWE315  No results found for: QMG867  No results found for: CA2729  No components found for: HGQUANT  No results found for: CEA1 / No results found for: CEA1   No results found for: AFPTUMOR  No results found for: CHROMOGRNA  No results found for: PSA1  No visits with results within 3 Day(s) from this visit.  Latest known visit with results is:  Appointment on 03/05/2019  Component Date Value Ref Range Status   Sodium 03/05/2019 142  135 - 145 mmol/L Final   Potassium 03/05/2019 4.1  3.5 - 5.1 mmol/L Final   Chloride 03/05/2019 109  98 - 111 mmol/L Final   CO2 03/05/2019 23  22 - 32 mmol/L Final   Glucose, Bld 03/05/2019 105* 70 - 99 mg/dL Final   BUN 03/05/2019 16  6 - 20 mg/dL Final   Creatinine, Ser 03/05/2019 0.74  0.44 - 1.00 mg/dL Final   Calcium 03/05/2019 8.7* 8.9 - 10.3 mg/dL Final   Total Protein 03/05/2019 6.1* 6.5 - 8.1 g/dL Final   Albumin 03/05/2019 3.6  3.5 - 5.0 g/dL Final   AST 03/05/2019 42* 15 - 41 U/L Final   ALT 03/05/2019 86* 0 - 44 U/L Final   Alkaline Phosphatase 03/05/2019 42  38 - 126 U/L Final   Total Bilirubin 03/05/2019 0.2* 0.3 - 1.2 mg/dL  Final   GFR calc non Af Amer 03/05/2019 >60  >60 mL/min Final   GFR calc Af Amer 03/05/2019 >60  >60 mL/min Final   Anion gap 03/05/2019 10  5 - 15 Final   Performed at [facility], [place] [address] [place], [place] [address]   WBC 03/05/2019 2.9* 4.0 - 10.5 K/uL Final   RBC 03/05/2019 3.42* 3.87 - 5.11 MIL/uL Final   Hemoglobin 03/05/2019 11.3* 12.0 - 15.0 g/dL Final   HCT 03/05/2019 34.9* 36.0 - 46.0 % Final   MCV 03/05/2019 102.0* 80.0 - 100.0 fL Final   MCH 03/05/2019 33.0  26.0 - 34.0 pg Final   MCHC 03/05/2019 32.4  30.0 - 36.0 g/dL Final   RDW 03/05/2019 14.3  11.5 - 15.5 % Final   Platelets 03/05/2019 277  150 - 400 K/uL Final   nRBC 03/05/2019 0.0  0.0 - 0.2 % Final   Neutrophils Relative % 03/05/2019 58  % Final   Neutro Abs 03/05/2019 1.7  1.7 - 7.7 K/uL Final   Lymphocytes Relative 03/05/2019 28  % Final   Lymphs Abs 03/05/2019 0.8  0.7 - 4.0 K/uL Final   Monocytes Relative 03/05/2019 9  % Final   Monocytes Absolute 03/05/2019 0.3  0.1 - 1.0 K/uL Final   Eosinophils Relative 03/05/2019 3  % Final   Eosinophils Absolute 03/05/2019 0.1  0.0 - 0.5 K/uL Final   Basophils Relative 03/05/2019 1  % Final   Basophils Absolute 03/05/2019 0.0  0.0 - 0.1 K/uL Final   Immature Granulocytes 03/05/2019 1  % Final   Abs Immature Granulocytes 03/05/2019 0.02  0.00 - 0.07 K/uL Final   Performed at [facility], [place] [address] [place], [place] [address]   Color, Urine 03/05/2019 YELLOW  YELLOW Final   APPearance 03/05/2019 CLEAR  CLEAR Final   Specific Gravity, Urine 03/05/2019 1.017  1.005 - 1.030 Final   pH 03/05/2019 5.0  5.0 - 8.0 Final   Glucose, UA 03/05/2019 NEGATIVE  NEGATIVE mg/dL Final   Hgb  urine dipstick 03/05/2019 NEGATIVE  NEGATIVE Final   Bilirubin Urine 03/05/2019 NEGATIVE  NEGATIVE Final   Ketones, ur 03/05/2019 NEGATIVE  NEGATIVE mg/dL Final   Protein, ur 03/05/2019 NEGATIVE  NEGATIVE  mg/dL Final   Nitrite 03/05/2019 NEGATIVE  NEGATIVE Final   Leukocytes,Ua 03/05/2019 NEGATIVE  NEGATIVE Final   RBC / HPF 03/05/2019 0-5  0 - 5 RBC/hpf Final   WBC, UA 03/05/2019 0-5  0 - 5 WBC/hpf Final   Bacteria, UA 03/05/2019 RARE* NONE SEEN Final   Squamous Epithelial / LPF 03/05/2019 0-5  0 - 5 Final   Mucus 03/05/2019 PRESENT   Final   Performed at [facility], [place] [address] [place], [place] [address]   Specimen Description 03/05/2019    Final                   Value:URINE, CLEAN CATCH Performed at [facility], [place] [address] [place], [place] [address]    Special Requests 03/05/2019    Final                   Value:NONE Performed at [facility], [place] [address], [place], [place] [address]    Culture 03/05/2019 MULTIPLE SPECIES PRESENT, SUGGEST RECOLLECTION*  Final   Report Status 03/05/2019 03/06/2019 FINAL   Final    (this displays the last labs from the last 3 days)  No results found for: TOTALPROTELP, ALBUMINELP, A1GS, A2GS, BETS, BETA2SER, GAMS, MSPIKE, SPEI (this displays SPEP labs)  No results found for: KPAFRELGTCHN, LAMBDASER, KAPLAMBRATIO (kappa/lambda light chains)  No results found for: HGBA, HGBA2QUANT, HGBFQUANT, HGBSQUAN (Hemoglobinopathy evaluation)   No results found for: LDH  No results found for: IRON, TIBC, IRONPCTSAT (Iron and TIBC)  No results found for: FERRITIN  Urinalysis    Component Value Date/Time   COLORURINE YELLOW 03/05/2019 0912   APPEARANCEUR CLEAR 03/05/2019 0912   LABSPEC 1.017 03/05/2019 0912   PHURINE 5.0 03/05/2019 0912   GLUCOSEU NEGATIVE 03/05/2019 0912   HGBUR NEGATIVE 03/05/2019 0912   BILIRUBINUR NEGATIVE 03/05/2019 0912   KETONESUR NEGATIVE 03/05/2019 0912   PROTEINUR NEGATIVE 03/05/2019 0912   UROBILINOGEN 0.2 01/27/2008 0235   NITRITE NEGATIVE 03/05/2019 0912   LEUKOCYTESUR NEGATIVE 03/05/2019 0912     STUDIES: No results  found.   ELIGIBLE FOR AVAILABLE RESEARCH PROTOCOL: no   ASSESSMENT: 48 y.o. [facility] woman status post left breast upper outer quadrant biopsy 10/02/2018 for a clinical T2N0, stage IB invasive lobular breast cancer, grade not stated, estrogen and progesterone receptor positive, HER-2 not amplified, with an MIB-1 of less than 1%  (1) status post left mastectomy and sentinel lymph node sampling 10/18/2018 for a pT3 pN2, stage IIA invasive lobular breast cancer, grade 2, with close but negative margins  (a) 5 of 9 lymph nodes removed had micrometastatic deposits of tumor  (b) immediate expander placement  (2) chemotherapy consisting of doxorubicin and cyclophosphamide in dose dense fashion x4 started 11/22/2018, completed 01/02/2019, followed by weekly paclitaxel x12 starting 01/15/2019  (3) adjuvant radiation to follow  (4) antiestrogens to start at the completion of local treatment  (a) consider goserelin/anastrozole   PLAN: Stephaie is doing well today.  She continues on weekly Paclitaxel.  She is starting to develop more symptoms as we near the end.  These are all mild and tolerable, and she has not yet developed signs of peripheral neuropathy.  She will proceed with #8 of weekly Paclitaxel today.  [name] and  I reviewed her rash.  She will continue to apply the topical cortaid ointment and avoid sun.   [place] will leave [place] a urine sample for her increased mild urinary discomfort.  We will run a urinalysis and culture.  I sent in [place] for her to take 5 ml sw and spit for her oral ulcer.  I encouraged her to continue with exercise, and that as she completes her chemotherapy and her fatigue decreases, she will become more active which can help with her getting back to her normal weight.  We will now see her on a weekly basis.  She will return in 1 week for labs, f/u, and her next treatment.  She was recommended to continue with the appropriate pandemic precautions. She knows  to call for any questions that may arise between now and her next appointment.  We are happy to see her sooner if needed.   A total of (30) minutes of face-to-face time was spent with this patient with greater than 50% of that time in counseling and care-coordination.   Magrinat, Virgie Dad, MD  03/09/19 2:11 PM Medical Oncology and Hematology [facility] [address] [place], [place] [address] Tel. [phone]    Fax. [phone]  I, Jacqualyn Posey am acting as a Education administrator for Chauncey Cruel, MD.   {Add scribe attestation statement}

## 2019-03-11 NOTE — Progress Notes (Signed)
[name]  [phone] Fax:([phone]) [phone]    ID: Sinai Illingworth Foster DOB: September 16, 1970  MR#: [id number]  JYN#:[id number]  Patient Care Team: Orpah Melter, MD as PCP - General (Family Medicine) Mauro Kaufmann, RN as Oncology Nurse Navigator Rockwell Germany, RN as Oncology Nurse Navigator Jovita Kussmaul, MD as Consulting Physician (General Surgery) Tabb Croghan, Virgie Dad, MD as Consulting Physician (Oncology) Kyung Rudd, MD as Consulting Physician (Radiation Oncology) Aloha Gell, MD as Consulting Physician (Obstetrics and Gynecology) [name], Loel Lofty, DO as Attending Physician (Plastic Surgery) Chauncey Cruel, MD OTHER MD:   CHIEF COMPLAINT: Estrogen receptor positive breast cancer  CURRENT TREATMENT: Adjuvant chemotherapy (weekly taxol)   INTERVAL HISTORY: [name] returns today for follow-up and treatment of her estrogen receptor positive breast cancer. She was last seen by Wilber Bihari, NP on 03/05/2019.  She continues on adjuvant chemotherapy consisting of weekly paclitaxel x12. Today is day 1 cycle 9.  At her last visit here, she reported increased mild urinary discomfort. Urinalysis and culture performed that day were negative for infection.  She is already scheduled for definite silicone implant placement mid September, about 10 days after her last paclitaxel treatment, which is adequate.  Of note, she spoke with Dr. Lisbeth Renshaw via [facility] on 03/06/2019 regarding radiation therapy. He anticipates beginning a 6.5 week course of radiation treatments about 2-3 weeks after her reconstruction surgery.   REVIEW OF SYSTEMS: [name] is having a variety of symptoms.  She has more fatigue and when she walks and goes up a hill she has to stop.  She has more achiness in her joints.  She has a vaginal discomfort which is not related to urination and of course she had a negative culture last week.  That persists.  She is not having hot flashes and she  is not having any peripheral neuropathy symptoms.  Her face is still very irritated although she is using hydrocortisone twice daily.  She is having some vision changes.  She has some "spots on her back" which she wanted to look at.  She is very worried because there has been some increase in her liver function tests.  Aside from these issues a detailed review of systems today was stable.  HISTORY OF CURRENT ILLNESS: From the original intake note:  "[name]" presented with left nipple retraction for 1 week with retroareolar mass in the upper outer quadrant. She underwent bilateral diagnostic mammography with CAD and left breast ultrasonography at [facility] on 09/25/2018 showing: 7 mm oval duct in the left breast; no significant abnormalities in the left axilla. She also underwent additional imaging with left breast digital diagnostic mammogram on 10/02/2018 showing: new 0.4 cm cluster of grouped heterogeneous calcifications in the left breast.   Accordingly on 10/02/2018 she proceeded to biopsy of the left breast area in question. 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 Prognostic indicators significant for: estrogen receptor, 90% positive, with moderate staining intensity and progesterone receptor, 100% positive, with strong staining intensity. Proliferation marker Ki67 at <1%. HER2 negative (1+).   The patient's subsequent history is as detailed above.    PAST MEDICAL HISTORY: Past Medical History:  Diagnosis Date  . Cancer ([place]) 10/18/2018   left breast cancer    PAST SURGICAL HISTORY: Past Surgical History:  Procedure Laterality Date  . BREAST RECONSTRUCTION WITH PLACEMENT OF TISSUE EXPANDER AND FLEX HD (ACELLULAR HYDRATED DERMIS) Left 10/18/2018    Procedure: IMMEDIATEVBREAST RECONSTRUCTION WITH PLACEMENT OF TISSUE EXPANDER AND FLEX HD (ACELLULAR HYDRATED DERMIS);  Surgeon: Wallace Going, DO;  Location: [facility];  Service: Plastics;  Laterality: Left;  Marland Kitchen MASTECTOMY    . MASTECTOMY W/ SENTINEL NODE BIOPSY Left 10/18/2018   Procedure: LEFT MASTECTOMY WITH SENTINEL LYMPH NODE BIOPSY;  Surgeon: Jovita Kussmaul, MD;  Location: [facility];  Service: General;  Laterality: Left;  . PORTACATH PLACEMENT Right 11/02/2018   Procedure: INSERTION PORT-A-CATH WITH ULTRASOUND;  Surgeon: Jovita Kussmaul, MD;  Location: [facility];  Service: General;  Laterality: Right;    FAMILY HISTORY Family History  Problem Relation Age of Onset  . Breast cancer Neg Hx   . Ovarian cancer Neg Hx    As of March 2020, patient's father is living and healthy at age 48. Patient's mother is also living and healthy at age 47. The patient denies a family hx of breast or ovarian cancer. She has 1 sister.   GYNECOLOGIC HISTORY:  No LMP recorded. (Menstrual status: IUD). Menarche: 48 years old Age at first live birth: 48 years old Blanco 2 LMP in 2010 Contraceptive: Mirena IUD has been replaced by a ParaGard IUD as of March 2020 She used birth control pills between ages 48 to 4 w/o event HRT n/a  Hysterectomy? no BSO? 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  She has a PhD in Probation officer.  Husband [name] is an Art gallery manager. She lives at home with her husband and 3 children. She has two children, [name] age 60 and [name] age 18. Husband [name] has one daughter, [name] age 74, who lives with them.    ADVANCED DIRECTIVES: In the absence of any documentation to the contrary her husband [name] is her HCPOA.   HEALTH MAINTENANCE: Social History   Tobacco Use  . Smoking status: Never Smoker  . Smokeless tobacco: Never Used  Substance Use Topics   . Alcohol use: Yes    Alcohol/week: 3.0 standard drinks    Types: 3 Standard drinks or equivalent per week    Comment: social  . Drug use: Never     Colonoscopy: never done  PAP: 08/2017  Bone density: never done   Allergies  Allergen Reactions  . Septra [Sulfamethoxazole-Trimethoprim]     Current Outpatient Medications  Medication Sig Dispense Refill  . diazepam (VALIUM) 2 MG tablet Take 1 tablet (2 mg total) by mouth every 6 (six) hours as needed for anxiety. 30 tablet 0  . lidocaine-prilocaine (EMLA) cream Apply to affected area once 30 g 3  . loratadine (CLARITIN) 10 MG tablet Take 1 tablet (10 mg total) by mouth daily. 90 tablet 0  . LORazepam (ATIVAN) 0.5 MG tablet Take 1 tablet (0.5 mg total) by mouth at bedtime as needed (Nausea or vomiting). 30 tablet 0  . magic mouthwash w/lidocaine SOLN Take 5 mLs by mouth 4 (four) times daily as needed for mouth pain. 240 mL 0  . predniSONE (DELTASONE) 5 MG tablet Take 6 tablets with breakfast beginning 02/13/2019, then 5 tablets the next day, 4 tablets the next and so on until done 21 tablet 0  . prochlorperazine (COMPAZINE) 10 MG tablet Take 1 tablet (10 mg total) by mouth every 6 (six) hours as needed (Nausea or vomiting).  30 tablet 1  . valACYclovir (VALTREX) 1000 MG tablet Take 1 tablet (1,000 mg total) by mouth daily. 10 tablet 0   No current facility-administered medications for this visit.     OBJECTIVE: Young white woman who appears stated age 86:   03/12/19 0850  BP: 110/70  Pulse: 87  Resp: 18  Temp: 99.1 F (37.3 C)  SpO2: 100%     Body mass index is 24.03 kg/m.   Wt Readings from Last 3 Encounters:  03/12/19 148 lb 14.4 oz (67.5 kg)  03/06/19 149 lb (67.6 kg)  03/05/19 149 lb (67.6 kg)  ECOG FS:1 - Symptomatic but completely ambulatory  Sclerae unicteric, EOMs intact Wearing a mask No cervical or supraclavicular adenopathy Lungs no rales or rhonchi Heart regular rate and rhythm Abd soft, nontender,  positive bowel sounds MSK no focal spinal tenderness, no upper extremity lymphedema Neuro: nonfocal, well oriented, appropriate affect Breasts: The right breast is unremarkable.  The left breast is status post mastectomy with expander in place. Skin: Along the back there are scattered flat areas of peeling looking like very early eczematous patches.  They are not raised or itchy.   LAB RESULTS:  CMP     Component Value Date/Time   NA 142 03/05/2019 0826   K 4.1 03/05/2019 0826   CL 109 03/05/2019 0826   CO2 23 03/05/2019 0826   GLUCOSE 105 (H) 03/05/2019 0826   BUN 16 03/05/2019 0826   CREATININE 0.74 03/05/2019 0826   CREATININE 0.75 12/01/2018 0808   CALCIUM 8.7 (L) 03/05/2019 0826   PROT 6.1 (L) 03/05/2019 0826   ALBUMIN 3.6 03/05/2019 0826   AST 42 (H) 03/05/2019 0826   AST 13 (L) 12/01/2018 0808   ALT 86 (H) 03/05/2019 0826   ALT 21 12/01/2018 0808   ALKPHOS 42 03/05/2019 0826   BILITOT 0.2 (L) 03/05/2019 0826   BILITOT <0.2 (L) 12/01/2018 0808   GFRNONAA >60 03/05/2019 0826   GFRNONAA >60 12/01/2018 0808   GFRAA >60 03/05/2019 0826   GFRAA >60 12/01/2018 0808    No results found for: TOTALPROTELP, ALBUMINELP, A1GS, A2GS, BETS, BETA2SER, GAMS, MSPIKE, SPEI  No results found for: KPAFRELGTCHN, LAMBDASER, [facility]  Lab Results  Component Value Date   WBC 2.8 (L) 03/12/2019   NEUTROABS 1.7 03/12/2019   HGB 11.6 (L) 03/12/2019   HCT 35.1 (L) 03/12/2019   MCV 100.3 (H) 03/12/2019   PLT 305 03/12/2019    No results found for: LABCA2  No components found for: MHDQQI297  No results for input(s): INR in the last 168 hours.  No results found for: LABCA2  No results found for: LGX211  No results found for: HER740  No results found for: CXK481  No results found for: CA2729  No components found for: HGQUANT  No results found for: CEA1 / No results found for: CEA1   No results found for: AFPTUMOR  No results found for: CHROMOGRNA  No results found  for: PSA1  Appointment on 03/12/2019  Component Date Value Ref Range Status  . WBC Count 03/12/2019 2.8* 4.0 - 10.5 K/uL Final  . RBC 03/12/2019 3.50* 3.87 - 5.11 MIL/uL Final  . Hemoglobin 03/12/2019 11.6* 12.0 - 15.0 g/dL Final  . HCT 03/12/2019 35.1* 36.0 - 46.0 % Final  . MCV 03/12/2019 100.3* 80.0 - 100.0 fL Final  . MCH 03/12/2019 33.1  26.0 - 34.0 pg Final  . MCHC 03/12/2019 33.0  30.0 - 36.0 g/dL Final  . RDW 03/12/2019 13.5  11.5 - 15.5 % Final  . Platelet Count 03/12/2019 305  150 - 400 K/uL Final  . nRBC 03/12/2019 0.0  0.0 - 0.2 % Final  . Neutrophils Relative % 03/12/2019 60  % Final  . Neutro Abs 03/12/2019 1.7  1.7 - 7.7 K/uL Final  . Lymphocytes Relative 03/12/2019 26  % Final  . Lymphs Abs 03/12/2019 0.7  0.7 - 4.0 K/uL Final  . Monocytes Relative 03/12/2019 11  % Final  . Monocytes Absolute 03/12/2019 0.3  0.1 - 1.0 K/uL Final  . Eosinophils Relative 03/12/2019 1  % Final  . Eosinophils Absolute 03/12/2019 0.0  0.0 - 0.5 K/uL Final  . Basophils Relative 03/12/2019 1  % Final  . Basophils Absolute 03/12/2019 0.0  0.0 - 0.1 K/uL Final  . Immature Granulocytes 03/12/2019 1  % Final  . Abs Immature Granulocytes 03/12/2019 0.02  0.00 - 0.07 K/uL Final   Performed at [facility], [address] [place], [place] [address]    (this displays the last labs from the last 3 days)  No results found for: TOTALPROTELP, ALBUMINELP, A1GS, A2GS, BETS, BETA2SER, GAMS, MSPIKE, SPEI (this displays SPEP labs)  No results found for: KPAFRELGTCHN, LAMBDASER, KAPLAMBRATIO (kappa/lambda light chains)  No results found for: HGBA, HGBA2QUANT, HGBFQUANT, HGBSQUAN (Hemoglobinopathy evaluation)   No results found for: LDH  No results found for: IRON, TIBC, IRONPCTSAT (Iron and TIBC)  No results found for: FERRITIN  Urinalysis    Component Value Date/Time   COLORURINE YELLOW 03/05/2019 0912   APPEARANCEUR CLEAR 03/05/2019 0912   LABSPEC 1.017 03/05/2019  0912   PHURINE 5.0 03/05/2019 0912   GLUCOSEU NEGATIVE 03/05/2019 0912   HGBUR NEGATIVE 03/05/2019 0912   BILIRUBINUR NEGATIVE 03/05/2019 0912   KETONESUR NEGATIVE 03/05/2019 0912   PROTEINUR NEGATIVE 03/05/2019 0912   UROBILINOGEN 0.2 01/27/2008 0235   NITRITE NEGATIVE 03/05/2019 0912   LEUKOCYTESUR NEGATIVE 03/05/2019 0912     STUDIES: No results found.   ELIGIBLE FOR AVAILABLE RESEARCH PROTOCOL: no   ASSESSMENT: 48 y.o. [facility] woman status post left breast upper outer quadrant biopsy 10/02/2018 for a clinical T2N0, stage IB invasive lobular breast cancer, grade not stated, estrogen and progesterone receptor positive, HER-2 not amplified, with an MIB-1 of less than 1%  (1) status post left mastectomy and sentinel lymph node sampling 10/18/2018 for a pT3 pN2, stage IIA invasive lobular breast cancer, grade 2, with close but negative margins  (a) 5 of 9 lymph nodes removed had micrometastatic deposits of tumor  (b) immediate expander placement  (2) chemotherapy consisting of doxorubicin and cyclophosphamide in dose dense fashion x4 started 11/22/2018, completed 01/02/2019, followed by weekly paclitaxel x12 starting 01/15/2019  (3) adjuvant radiation to follow  (4) antiestrogens to start at the completion of local treatment  (a) consider goserelin/anastrozole   PLAN: Kaliyah is having a variety of symptoms related to her treatment and she is having some irritation of her liver.  I think her body is telling us it is time to take a break and there is no reason why we need to push on despite all these problems.  Instead she will get a week off, no treatment today.  She will be treated again next week.  We discussed the visual changes which are due to accommodation problems from the steroids.  As far as the vaginal issues are concerned she is going to use any emollient she wishes such as coconut oil or K-Y jelly.  I am not sure  what is happening with the spots on her back.  Again  they look like eczematous areas, but they are very small and scattered and very very shallow.  These need only follow-up.  The good news is that she is not having any peripheral neuropathy symptoms.  She will return to see me next week hopefully we will be able to resume treatment at that point.  Virgie Dad. Scout Guyett, MD  03/12/19 9:00 AM Medical Oncology and Hematology [facility] [address] [place],  [address] Tel. [phone]    Fax. [phone]   I, Wilburn Mylar, am acting as scribe for Dr. Virgie Dad. Saskia Simerson.  I, Lurline Del MD, have reviewed the above documentation for accuracy and completeness, and I agree with the above.

## 2019-03-12 ENCOUNTER — Inpatient Hospital Stay: Payer: 59

## 2019-03-12 ENCOUNTER — Other Ambulatory Visit: Payer: Self-pay

## 2019-03-12 ENCOUNTER — Inpatient Hospital Stay (HOSPITAL_BASED_OUTPATIENT_CLINIC_OR_DEPARTMENT_OTHER): Payer: 59 | Admitting: Oncology

## 2019-03-12 VITALS — BP 110/70 | HR 87 | Temp 99.1°F | Resp 18 | Ht 66.0 in | Wt 148.9 lb

## 2019-03-12 DIAGNOSIS — Z170 Estrogen receptor positive status [ER+]: Secondary | ICD-10-CM

## 2019-03-12 DIAGNOSIS — C50412 Malignant neoplasm of upper-outer quadrant of left female breast: Secondary | ICD-10-CM

## 2019-03-12 DIAGNOSIS — Z5111 Encounter for antineoplastic chemotherapy: Secondary | ICD-10-CM | POA: Diagnosis not present

## 2019-03-12 DIAGNOSIS — Z95828 Presence of other vascular implants and grafts: Secondary | ICD-10-CM

## 2019-03-12 LAB — CMP (CANCER CENTER ONLY)
ALT: 71 U/L — ABNORMAL HIGH (ref 0–44)
AST: 34 U/L (ref 15–41)
Albumin: 3.7 g/dL (ref 3.5–5.0)
Alkaline Phosphatase: 44 U/L (ref 38–126)
Anion gap: 10 (ref 5–15)
BUN: 16 mg/dL (ref 6–20)
CO2: 24 mmol/L (ref 22–32)
Calcium: 9 mg/dL (ref 8.9–10.3)
Chloride: 107 mmol/L (ref 98–111)
Creatinine: 0.78 mg/dL (ref 0.44–1.00)
GFR, Est AFR Am: >60 mL/min (ref >60)
GFR, Estimated: >60 mL/min (ref >60)
Glucose, Bld: 92 mg/dL (ref 70–99)
Potassium: 4.2 mmol/L (ref 3.5–5.1)
Sodium: 141 mmol/L (ref 135–145)
Total Bilirubin: 0.3 mg/dL (ref 0.3–1.2)
Total Protein: 6.3 g/dL — ABNORMAL LOW (ref 6.5–8.1)

## 2019-03-12 LAB — CBC WITH DIFFERENTIAL (CANCER CENTER ONLY)
Abs Immature Granulocytes: 0.02 10*3/uL (ref 0.00–0.07)
Basophils Absolute: 0 10*3/uL (ref 0.0–0.1)
Basophils Relative: 1 %
Eosinophils Absolute: 0 10*3/uL (ref 0.0–0.5)
Eosinophils Relative: 1 %
HCT: 35.1 % — ABNORMAL LOW (ref 36.0–46.0)
Hemoglobin: 11.6 g/dL — ABNORMAL LOW (ref 12.0–15.0)
Immature Granulocytes: 1 %
Lymphocytes Relative: 26 %
Lymphs Abs: 0.7 10*3/uL (ref 0.7–4.0)
MCH: 33.1 pg (ref 26.0–34.0)
MCHC: 33 g/dL (ref 30.0–36.0)
MCV: 100.3 fL — ABNORMAL HIGH (ref 80.0–100.0)
Monocytes Absolute: 0.3 10*3/uL (ref 0.1–1.0)
Monocytes Relative: 11 %
Neutro Abs: 1.7 10*3/uL (ref 1.7–7.7)
Neutrophils Relative %: 60 %
Platelet Count: 305 10*3/uL (ref 150–400)
RBC: 3.5 MIL/uL — ABNORMAL LOW (ref 3.87–5.11)
RDW: 13.5 % (ref 11.5–15.5)
WBC Count: 2.8 10*3/uL — ABNORMAL LOW (ref 4.0–10.5)
nRBC: 0 % (ref 0.0–0.2)

## 2019-03-12 MED ORDER — SODIUM CHLORIDE 0.9% FLUSH
10.0000 mL | Freq: Once | INTRAVENOUS | Status: AC
  Administered 2019-03-12: 10 mL
  Filled 2019-03-12: qty 10

## 2019-03-12 NOTE — Patient Instructions (Signed)

## 2019-03-17 NOTE — Progress Notes (Signed)
[name]  [phone] [phone]    ID: [name] DOB: [birth year]  MR#: [id number]  OFB#:[id number]  Patient Care Team: Orpah Melter, MD as PCP - General (Family Medicine) Mauro Kaufmann, RN as Oncology Nurse Navigator Rockwell Germany, RN as Oncology Nurse Navigator Jovita Kussmaul, MD as Consulting Physician (General Surgery) , Virgie Dad, MD as Consulting Physician (Oncology) Kyung Rudd, MD as Consulting Physician (Radiation Oncology) Aloha Gell, MD as Consulting Physician (Obstetrics and Gynecology) Dillingham, Loel Lofty, DO as Attending Physician (Plastic Surgery) Holton MD:   CHIEF COMPLAINT: Estrogen receptor positive breast cancer  CURRENT TREATMENT: Adjuvant chemotherapy (weekly taxol)   INTERVAL HISTORY: [name] returns today for follow-up and treatment of her estrogen receptor positive breast cancer. She was last seen here on 03/12/2019.   She continues on adjuvant chemotherapy consisting of weekly paclitaxel x12. Her treatment was delayed last week due to chemotherapy side effects and irritation of her liver. Today is day 1 cycle 9.  Taking off a week she says "was the right decision".  She has a lot more energy.  She is been able to work on the boat, work from home, and exercise regularly.  Her rash also has improved although it has not cleared.  Since her last visit here, she has not undergone any additional studies. She will be due for mammography after 09/2019.    REVIEW OF SYSTEMS: [name] has not had a period since June.  She is having not so much hot flashes is just feeling hot and cold and hot and cold especially at night.  There have been no unusual headaches, visual changes, cough phlegm production pleurisy or shortness of breath or change in bowel or bladder habits.  She has had no peripheral neuropathy.  A detailed review of systems today was otherwise stable.    HISTORY OF  CURRENT ILLNESS: From the original intake note:  "[name]" presented with left nipple retraction for 1 week with retroareolar mass in the upper outer quadrant. She underwent bilateral diagnostic mammography with CAD and left breast ultrasonography at [facility] on 09/25/2018 showing: 7 mm oval duct in the left breast; no significant abnormalities in the left axilla. She also underwent additional imaging with left breast digital diagnostic mammogram on 10/02/2018 showing: new 0.4 cm cluster of grouped heterogeneous calcifications in the left breast.   Accordingly on 10/02/2018 she proceeded to biopsy of the left breast area in question. The pathology [id number]) from this procedure showed: mammary carcinoma in situ at the 3 o'clock mass, with possible focal microinvasion; invasive and in situ mammary carcinoma at the 12 o'clock mass, immunostain for E-cadherin is negative in the tumor cells, consistent with a lobular phenotype. Prognostic indicators significant for: estrogen receptor, 90% positive, with moderate staining intensity and progesterone receptor, 100% positive, with strong staining intensity. Proliferation marker Ki67 at <1%. HER2 negative (1+).   The patient's subsequent history is as detailed above.    PAST MEDICAL HISTORY: Past Medical History:  Diagnosis Date  . Cancer ([facility]) 10/18/2018   left breast cancer    PAST SURGICAL HISTORY: Past Surgical History:  Procedure Laterality Date  . BREAST RECONSTRUCTION WITH PLACEMENT OF TISSUE EXPANDER AND FLEX HD (ACELLULAR HYDRATED DERMIS) Left 10/18/2018   Procedure: IMMEDIATEVBREAST RECONSTRUCTION WITH PLACEMENT OF TISSUE EXPANDER AND FLEX HD (ACELLULAR HYDRATED DERMIS);  Surgeon: Wallace Going, DO;  Location: [facility];  Service: Plastics;  Laterality: Left;  Marland Kitchen MASTECTOMY    . MASTECTOMY  W/ SENTINEL NODE BIOPSY Left 10/18/2018   Procedure: LEFT MASTECTOMY WITH SENTINEL LYMPH NODE BIOPSY;  Surgeon: Jovita Kussmaul, MD;   Location: [facility];  Service: General;  Laterality: Left;  . PORTACATH PLACEMENT Right 11/02/2018   Procedure: INSERTION PORT-A-CATH WITH ULTRASOUND;  Surgeon: Jovita Kussmaul, MD;  Location: [facility];  Service: General;  Laterality: Right;    FAMILY HISTORY Family History  Problem Relation Age of Onset  . Breast cancer Neg Hx   . Ovarian cancer Neg Hx    As of March 2020, patient's father is living and healthy at age 72. Patient's mother is also living and healthy at age 40. The patient denies a family hx of breast or ovarian cancer. She has 1 sister.   GYNECOLOGIC HISTORY:  No LMP recorded. (Menstrual status: IUD). Menarche: 48 years old Age at first live birth: 48 years old [place] 2 LMP in 2010 Contraceptive: Mirena IUD has been replaced by a ParaGard IUD as of March 2020 She used birth control pills between ages 13 to 36 w/o event HRT n/a  Hysterectomy? no BSO? 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  She has a PhD in Probation officer.  Husband [name] is an Art gallery manager. She lives at home with her husband and 3 children. She has two children, [name] age 50 and [name] age 53. Husband [name] has one daughter, Bronwen Pendergraft age 31, who lives with them.   ADVANCED DIRECTIVES: In the absence of any documentation to the contrary her husband [name] is her HCPOA.   HEALTH MAINTENANCE: Social History   Tobacco Use  . Smoking status: Never Smoker  . Smokeless tobacco: Never Used  Substance Use Topics  . Alcohol use: Yes    Alcohol/week: 3.0 standard drinks    Types: 3 Standard drinks or equivalent per week    Comment: social  . Drug use: Never     Colonoscopy: never done  PAP: 08/2017  Bone density: never done   Allergies  Allergen Reactions  . Septra [Sulfamethoxazole-Trimethoprim]     Current Outpatient Medications  Medication Sig Dispense Refill  . diazepam (VALIUM) 2 MG  tablet Take 1 tablet (2 mg total) by mouth every 6 (six) hours as needed for anxiety. 30 tablet 0  . lidocaine-prilocaine (EMLA) cream Apply to affected area once 30 g 3  . loratadine (CLARITIN) 10 MG tablet Take 1 tablet (10 mg total) by mouth daily. 90 tablet 0  . LORazepam (ATIVAN) 0.5 MG tablet Take 1 tablet (0.5 mg total) by mouth at bedtime as needed (Nausea or vomiting). 30 tablet 0  . magic mouthwash w/lidocaine SOLN Take 5 mLs by mouth 4 (four) times daily as needed for mouth pain. 240 mL 0  . predniSONE (DELTASONE) 5 MG tablet Take 6 tablets with breakfast beginning 02/13/2019, then 5 tablets the next day, 4 tablets the next and so on until done 21 tablet 0  . prochlorperazine (COMPAZINE) 10 MG tablet Take 1 tablet (10 mg total) by mouth every 6 (six) hours as needed (Nausea or vomiting). 30 tablet 1  . valACYclovir (VALTREX) 1000 MG tablet Take 1 tablet (1,000 mg total) by mouth daily. 10 tablet 0   No current facility-administered medications for this visit.     OBJECTIVE: Young white woman in no acute distress  There were no vitals filed for this visit. Wt Readings from Last 3 Encounters:  03/12/19 148 lb 14.4  oz (67.5 kg)  03/06/19 149 lb (67.6 kg)  03/05/19 149 lb (67.6 kg)   There is no height or weight on file to calculate BMI.    ECOG FS:1 - Symptomatic but completely ambulatory  Ocular: Sclerae unicteric, pupils round and equal Ear-nose-throat: Oropharynx clear and moist Lymphatic: No cervical or supraclavicular adenopathy Lungs no rales or rhonchi Heart regular rate and rhythm Abd soft, nontender, positive bowel sounds MSK no focal spinal tenderness, no joint edema Neuro: non-focal, well-oriented, appropriate affect Breasts: Deferred Skin: She continues to have patchy widely scattered scaly mildly erythematous lesions in the back particularly and also around the neck.   LAB RESULTS:  CMP     Component Value Date/Time   NA 141 03/12/2019 0833   K 4.2  03/12/2019 0833   CL 107 03/12/2019 0833   CO2 24 03/12/2019 0833   GLUCOSE 92 03/12/2019 0833   BUN 16 03/12/2019 0833   CREATININE 0.78 03/12/2019 0833   CALCIUM 9.0 03/12/2019 0833   PROT 6.3 (L) 03/12/2019 0833   ALBUMIN 3.7 03/12/2019 0833   AST 34 03/12/2019 0833   ALT 71 (H) 03/12/2019 0833   ALKPHOS 44 03/12/2019 0833   BILITOT 0.3 03/12/2019 0833   GFRNONAA >60 03/12/2019 0833   GFRAA >60 03/12/2019 0833    No results found for: TOTALPROTELP, ALBUMINELP, A1GS, A2GS, BETS, BETA2SER, GAMS, MSPIKE, SPEI  No results found for: KPAFRELGTCHN, LAMBDASER, [facility]  Lab Results  Component Value Date   WBC 2.8 (L) 03/12/2019   NEUTROABS 1.7 03/12/2019   HGB 11.6 (L) 03/12/2019   HCT 35.1 (L) 03/12/2019   MCV 100.3 (H) 03/12/2019   PLT 305 03/12/2019    No results found for: LABCA2  No components found for: [id number]  No results for input(s): INR in the last 168 hours.  No results found for: LABCA2  No results found for: CLE751  No results found for: ZGY174  No results found for: BSW967  No results found for: CA2729  No components found for: HGQUANT  No results found for: CEA1 / No results found for: CEA1   No results found for: AFPTUMOR  No results found for: CHROMOGRNA  No results found for: PSA1  No visits with results within 3 Day(s) from this visit.  Latest known visit with results is:  Appointment on 03/12/2019  Component Date Value Ref Range Status  . Sodium 03/12/2019 141  135 - 145 mmol/L Final  . Potassium 03/12/2019 4.2  3.5 - 5.1 mmol/L Final  . Chloride 03/12/2019 107  98 - 111 mmol/L Final  . CO2 03/12/2019 24  22 - 32 mmol/L Final  . Glucose, Bld 03/12/2019 92  70 - 99 mg/dL Final  . BUN 03/12/2019 16  6 - 20 mg/dL Final  . Creatinine 03/12/2019 0.78  0.44 - 1.00 mg/dL Final  . Calcium 03/12/2019 9.0  8.9 - 10.3 mg/dL Final  . Total Protein 03/12/2019 6.3* 6.5 - 8.1 g/dL Final  . Albumin 03/12/2019 3.7  3.5 - 5.0 g/dL Final  .  AST 03/12/2019 34  15 - 41 U/L Final  . ALT 03/12/2019 71* 0 - 44 U/L Final  . Alkaline Phosphatase 03/12/2019 44  38 - 126 U/L Final  . Total Bilirubin 03/12/2019 0.3  0.3 - 1.2 mg/dL Final  . GFR, Est Non Af Am 03/12/2019 >60  >60 mL/min Final  . GFR, Est AFR Am 03/12/2019 >60  >60 mL/min Final  . Anion gap 03/12/2019 10  5 - 15 Final  Performed at [facility], [place] [address], [place], [place] [address]  . WBC Count 03/12/2019 2.8* 4.0 - 10.5 K/uL Final  . RBC 03/12/2019 3.50* 3.87 - 5.11 MIL/uL Final  . Hemoglobin 03/12/2019 11.6* 12.0 - 15.0 g/dL Final  . HCT 03/12/2019 35.1* 36.0 - 46.0 % Final  . MCV 03/12/2019 100.3* 80.0 - 100.0 fL Final  . MCH 03/12/2019 33.1  26.0 - 34.0 pg Final  . MCHC 03/12/2019 33.0  30.0 - 36.0 g/dL Final  . RDW 03/12/2019 13.5  11.5 - 15.5 % Final  . Platelet Count 03/12/2019 305  150 - 400 K/uL Final  . nRBC 03/12/2019 0.0  0.0 - 0.2 % Final  . Neutrophils Relative % 03/12/2019 60  % Final  . Neutro Abs 03/12/2019 1.7  1.7 - 7.7 K/uL Final  . Lymphocytes Relative 03/12/2019 26  % Final  . Lymphs Abs 03/12/2019 0.7  0.7 - 4.0 K/uL Final  . Monocytes Relative 03/12/2019 11  % Final  . Monocytes Absolute 03/12/2019 0.3  0.1 - 1.0 K/uL Final  . Eosinophils Relative 03/12/2019 1  % Final  . Eosinophils Absolute 03/12/2019 0.0  0.0 - 0.5 K/uL Final  . Basophils Relative 03/12/2019 1  % Final  . Basophils Absolute 03/12/2019 0.0  0.0 - 0.1 K/uL Final  . Immature Granulocytes 03/12/2019 1  % Final  . Abs Immature Granulocytes 03/12/2019 0.02  0.00 - 0.07 K/uL Final   Performed at [facility], [address] [place], [place] [address]    (this displays the last labs from the last 3 days)  No results found for: TOTALPROTELP, ALBUMINELP, A1GS, A2GS, BETS, BETA2SER, GAMS, MSPIKE, SPEI (this displays SPEP labs)  No results found for: KPAFRELGTCHN, LAMBDASER, KAPLAMBRATIO (kappa/lambda light chains)  No  results found for: HGBA, HGBA2QUANT, HGBFQUANT, HGBSQUAN (Hemoglobinopathy evaluation)   No results found for: LDH  No results found for: IRON, TIBC, IRONPCTSAT (Iron and TIBC)  No results found for: FERRITIN  Urinalysis    Component Value Date/Time   COLORURINE YELLOW 03/05/2019 0912   APPEARANCEUR CLEAR 03/05/2019 0912   LABSPEC 1.017 03/05/2019 0912   PHURINE 5.0 03/05/2019 0912   GLUCOSEU NEGATIVE 03/05/2019 0912   HGBUR NEGATIVE 03/05/2019 0912   BILIRUBINUR NEGATIVE 03/05/2019 0912   KETONESUR NEGATIVE 03/05/2019 0912   PROTEINUR NEGATIVE 03/05/2019 0912   UROBILINOGEN 0.2 01/27/2008 0235   NITRITE NEGATIVE 03/05/2019 0912   LEUKOCYTESUR NEGATIVE 03/05/2019 0912     STUDIES: No results found.   ELIGIBLE FOR AVAILABLE RESEARCH PROTOCOL: no   ASSESSMENT: 48 y.o. [facility] In[facility] woman status post left breast upper outer quadrant biopsy 10/02/2018 for a clinical T2N0, stage IB invasive lobular breast cancer, grade not stated, estrogen and progesterone receptor positive, HER-2 not amplified, with an MIB-1 of less than 1%  (1) status post left mastectomy and sentinel lymph node sampling 10/18/2018 for a pT3 pN2, stage IIA invasive lobular breast cancer, grade 2, with close but negative margins  (a) 5 of 9 lymph nodes removed had micrometastatic deposits of tumor  (b) immediate expander placement  (2) chemotherapy consisting of doxorubicin and cyclophosphamide in dose dense fashion x4 started 11/22/2018, completed 01/02/2019, followed by weekly paclitaxel x12 starting 01/15/2019  (3) adjuvant radiation to follow  (4) antiestrogens to start at the completion of local treatment  (a) consider goserelin/anastrozole   PLAN: [name] got her energy back and is ready to proceed to cycle 9 of 12 planned weekly paclitaxel.  If we  continue as planned, then her last chemo treatment will be rather close to her surgery and we might want to omit that.  On the other hand she may develop  some peripheral neuropathy prior to that which also would lead to admission so right now we are simply continuing with the plan as before.  I do not have a solution for her rash.  It did get better with a week off and hopefully it will resolve off treatment.  If not we will refer to dermatology.  She will see me again in a week.  She knows to call for any other issue that may develop before then. Virgie Dad. , MD  03/17/19 5:46 PM Medical Oncology and Hematology [facility] [address] [place], [place] [address] Tel. [phone]    Fax. [phone]  I, Jacqualyn Posey am acting as a Education administrator for Chauncey Cruel, MD.   I, Lurline Del MD, have reviewed the above documentation for accuracy and completeness, and I agree with the above.

## 2019-03-19 ENCOUNTER — Encounter: Payer: Self-pay | Admitting: *Deleted

## 2019-03-19 ENCOUNTER — Other Ambulatory Visit: Payer: Self-pay

## 2019-03-19 ENCOUNTER — Inpatient Hospital Stay: Payer: 59

## 2019-03-19 ENCOUNTER — Inpatient Hospital Stay (HOSPITAL_BASED_OUTPATIENT_CLINIC_OR_DEPARTMENT_OTHER): Payer: 59 | Admitting: Oncology

## 2019-03-19 ENCOUNTER — Other Ambulatory Visit: Payer: 59

## 2019-03-19 VITALS — BP 121/64 | HR 76 | Temp 98.7°F | Resp 18 | Wt 149.1 lb

## 2019-03-19 DIAGNOSIS — Z170 Estrogen receptor positive status [ER+]: Secondary | ICD-10-CM

## 2019-03-19 DIAGNOSIS — C50412 Malignant neoplasm of upper-outer quadrant of left female breast: Secondary | ICD-10-CM

## 2019-03-19 DIAGNOSIS — Z95828 Presence of other vascular implants and grafts: Secondary | ICD-10-CM

## 2019-03-19 DIAGNOSIS — Z5111 Encounter for antineoplastic chemotherapy: Secondary | ICD-10-CM | POA: Diagnosis not present

## 2019-03-19 LAB — CMP (CANCER CENTER ONLY)
ALT: 35 U/L (ref 0–44)
AST: 22 U/L (ref 15–41)
Albumin: 3.8 g/dL (ref 3.5–5.0)
Alkaline Phosphatase: 48 U/L (ref 38–126)
Anion gap: 9 (ref 5–15)
BUN: 17 mg/dL (ref 6–20)
CO2: 25 mmol/L (ref 22–32)
Calcium: 9.1 mg/dL (ref 8.9–10.3)
Chloride: 107 mmol/L (ref 98–111)
Creatinine: 0.79 mg/dL (ref 0.44–1.00)
GFR, Est AFR Am: >60 mL/min (ref >60)
GFR, Estimated: >60 mL/min (ref >60)
Glucose, Bld: 106 mg/dL — ABNORMAL HIGH (ref 70–99)
Potassium: 4.1 mmol/L (ref 3.5–5.1)
Sodium: 141 mmol/L (ref 135–145)
Total Bilirubin: 0.3 mg/dL (ref 0.3–1.2)
Total Protein: 6.5 g/dL (ref 6.5–8.1)

## 2019-03-19 LAB — CBC WITH DIFFERENTIAL (CANCER CENTER ONLY)
Abs Immature Granulocytes: 0.02 10*3/uL (ref 0.00–0.07)
Basophils Absolute: 0 10*3/uL (ref 0.0–0.1)
Basophils Relative: 1 %
Eosinophils Absolute: 0.1 10*3/uL (ref 0.0–0.5)
Eosinophils Relative: 2 %
HCT: 36.9 % (ref 36.0–46.0)
Hemoglobin: 12 g/dL (ref 12.0–15.0)
Immature Granulocytes: 1 %
Lymphocytes Relative: 26 %
Lymphs Abs: 1 10*3/uL (ref 0.7–4.0)
MCH: 33.2 pg (ref 26.0–34.0)
MCHC: 32.5 g/dL (ref 30.0–36.0)
MCV: 102.2 fL — ABNORMAL HIGH (ref 80.0–100.0)
Monocytes Absolute: 0.6 10*3/uL (ref 0.1–1.0)
Monocytes Relative: 15 %
Neutro Abs: 2.2 10*3/uL (ref 1.7–7.7)
Neutrophils Relative %: 55 %
Platelet Count: 295 10*3/uL (ref 150–400)
RBC: 3.61 MIL/uL — ABNORMAL LOW (ref 3.87–5.11)
RDW: 12.7 % (ref 11.5–15.5)
WBC Count: 3.9 10*3/uL — ABNORMAL LOW (ref 4.0–10.5)
nRBC: 0 % (ref 0.0–0.2)

## 2019-03-19 MED ORDER — DIPHENHYDRAMINE HCL 50 MG/ML IJ SOLN
INTRAMUSCULAR | Status: AC
  Filled 2019-03-19: qty 1

## 2019-03-19 MED ORDER — DEXAMETHASONE SODIUM PHOSPHATE 10 MG/ML IJ SOLN
INTRAMUSCULAR | Status: AC
  Filled 2019-03-19: qty 1

## 2019-03-19 MED ORDER — SODIUM CHLORIDE 0.9 % IV SOLN
Freq: Once | INTRAVENOUS | Status: AC
  Administered 2019-03-19: 09:00:00 via INTRAVENOUS
  Filled 2019-03-19: qty 250

## 2019-03-19 MED ORDER — FAMOTIDINE IN NACL 20-0.9 MG/50ML-% IV SOLN
INTRAVENOUS | Status: AC
  Filled 2019-03-19: qty 50

## 2019-03-19 MED ORDER — DIPHENHYDRAMINE HCL 50 MG/ML IJ SOLN
25.0000 mg | Freq: Once | INTRAMUSCULAR | Status: AC
  Administered 2019-03-19: 09:00:00 25 mg via INTRAVENOUS

## 2019-03-19 MED ORDER — SODIUM CHLORIDE 0.9% FLUSH
10.0000 mL | Freq: Once | INTRAVENOUS | Status: AC
  Administered 2019-03-19: 08:00:00 10 mL
  Filled 2019-03-19: qty 10

## 2019-03-19 MED ORDER — SODIUM CHLORIDE 0.9% FLUSH
10.0000 mL | INTRAVENOUS | Status: DC | PRN
  Administered 2019-03-19: 12:00:00 10 mL
  Filled 2019-03-19: qty 10

## 2019-03-19 MED ORDER — FAMOTIDINE IN NACL 20-0.9 MG/50ML-% IV SOLN
20.0000 mg | Freq: Once | INTRAVENOUS | Status: AC
  Administered 2019-03-19: 10:00:00 20 mg via INTRAVENOUS

## 2019-03-19 MED ORDER — DEXAMETHASONE SODIUM PHOSPHATE 10 MG/ML IJ SOLN
4.0000 mg | Freq: Once | INTRAMUSCULAR | Status: AC
  Administered 2019-03-19: 10:00:00 4 mg via INTRAVENOUS

## 2019-03-19 MED ORDER — HEPARIN SOD (PORK) LOCK FLUSH 100 UNIT/ML IV SOLN
500.0000 [IU] | Freq: Once | INTRAVENOUS | Status: AC | PRN
  Administered 2019-03-19: 12:00:00 500 [IU]
  Filled 2019-03-19: qty 5

## 2019-03-19 MED ORDER — SODIUM CHLORIDE 0.9 % IV SOLN
80.0000 mg/m2 | Freq: Once | INTRAVENOUS | Status: AC
  Administered 2019-03-19: 11:00:00 138 mg via INTRAVENOUS
  Filled 2019-03-19: qty 23

## 2019-03-19 NOTE — Patient Instructions (Signed)
[facility] Discharge Instructions for Patients Receiving Chemotherapy  Today you received the following chemotherapy agents:  Taxol.  To help prevent nausea and vomiting after your treatment, we encourage you to take your nausea medication as directed.   If you develop nausea and vomiting that is not controlled by your nausea medication, call the clinic.   BELOW ARE SYMPTOMS THAT SHOULD BE REPORTED IMMEDIATELY:  *FEVER GREATER THAN 100.5 F  *CHILLS WITH OR WITHOUT FEVER  NAUSEA AND VOMITING THAT IS NOT CONTROLLED WITH YOUR NAUSEA MEDICATION  *UNUSUAL SHORTNESS OF BREATH  *UNUSUAL BRUISING OR BLEEDING  TENDERNESS IN MOUTH AND THROAT WITH OR WITHOUT PRESENCE OF ULCERS  *URINARY PROBLEMS  *BOWEL PROBLEMS  UNUSUAL RASH Items with * indicate a potential emergency and should be followed up as soon as possible.  Feel free to call the clinic should you have any questions or concerns. The clinic phone number is [phone].  Please show the CHEMO ALERT CARD at check-in to the Emergency Department and triage nurse.   

## 2019-03-26 NOTE — Progress Notes (Signed)
[facility]  [phone] [phone]    ID: Dakari Stabler Bernier DOB: 1971/06/24  MR#: [id number]  GMW#:[id number]  Patient Care Team: Orpah Melter, MD as PCP - General (Family Medicine) Mauro Kaufmann, RN as Oncology Nurse Navigator Rockwell Germany, RN as Oncology Nurse Navigator Jovita Kussmaul, MD as Consulting Physician (General Surgery) Magrinat, Virgie Dad, MD as Consulting Physician (Oncology) Kyung Rudd, MD as Consulting Physician (Radiation Oncology) Aloha Gell, MD as Consulting Physician (Obstetrics and Gynecology) [name], Loel Lofty, DO as Attending Physician (Plastic Surgery) Chauncey Cruel, MD OTHER MD:   CHIEF COMPLAINT: Estrogen receptor positive breast cancer  CURRENT TREATMENT: Adjuvant chemotherapy (weekly taxol)   INTERVAL HISTORY: Hawley returns today for follow-up and treatment of her estrogen receptor positive breast cancer.   She continues on adjuvant chemotherapy consisting of weekly paclitaxel x12. Today is day 1 cycle 10.  Since her last visit here, she has not undergone any additional studies. She will be due for repeat mammography in 09/2019.   REVIEW OF SYSTEMS: Jasline is having absolutely no neuropathy.  She is exercising by walking.  She still has a rash which is a little bit itchy but she is using hydrocortisone and that is helpful.  She and her family did a lot of cleaning over the holiday.  A detailed review of systems was otherwise noncontributory   HISTORY OF CURRENT ILLNESS: From the original intake note:  "Tanisia" presented with left nipple retraction for 1 week with retroareolar mass in the upper outer quadrant. She underwent bilateral diagnostic mammography with CAD and left breast ultrasonography at [facility] on 09/25/2018 showing: 7 mm oval duct in the left breast; no significant abnormalities in the left axilla. She also underwent additional imaging with left breast digital diagnostic  mammogram on 10/02/2018 showing: new 0.4 cm cluster of grouped heterogeneous calcifications in the left breast.   Accordingly on 10/02/2018 she proceeded to biopsy of the left breast area in question. The pathology [id number]) from this procedure showed: mammary carcinoma in situ at the 3 o'clock mass, with possible focal microinvasion; invasive and in situ mammary carcinoma at the 12 o'clock mass, immunostain for E-cadherin is negative in the tumor cells, consistent with a lobular phenotype. Prognostic indicators significant for: estrogen receptor, 90% positive, with moderate staining intensity and progesterone receptor, 100% positive, with strong staining intensity. Proliferation marker Ki67 at <1%. HER2 negative (1+).   The patient's subsequent history is as detailed above.    PAST MEDICAL HISTORY: Past Medical History:  Diagnosis Date  . Cancer ([place]) 10/18/2018   left breast cancer    PAST SURGICAL HISTORY: Past Surgical History:  Procedure Laterality Date  . BREAST RECONSTRUCTION WITH PLACEMENT OF TISSUE EXPANDER AND FLEX HD (ACELLULAR HYDRATED DERMIS) Left 10/18/2018   Procedure: IMMEDIATEVBREAST RECONSTRUCTION WITH PLACEMENT OF TISSUE EXPANDER AND FLEX HD (ACELLULAR HYDRATED DERMIS);  Surgeon: Wallace Going, DO;  Location: [facility];  Service: Plastics;  Laterality: Left;  Marland Kitchen MASTECTOMY    . MASTECTOMY W/ SENTINEL NODE BIOPSY Left 10/18/2018   Procedure: LEFT MASTECTOMY WITH SENTINEL LYMPH NODE BIOPSY;  Surgeon: Jovita Kussmaul, MD;  Location: [facility];  Service: General;  Laterality: Left;  . PORTACATH PLACEMENT Right 11/02/2018   Procedure: INSERTION PORT-A-CATH WITH ULTRASOUND;  Surgeon: Jovita Kussmaul, MD;  Location: [facility];  Service: General;  Laterality: Right;    FAMILY HISTORY Family History  Problem Relation Age of Onset  . Breast cancer Neg Hx   .  Ovarian cancer Neg Hx    As of March 2020, patient's father is living  and healthy at age 44. Patient's mother is also living and healthy at age 57. The patient denies a family hx of breast or ovarian cancer. She has 1 sister.   GYNECOLOGIC HISTORY:  No LMP recorded. (Menstrual status: IUD). Menarche: 48 years old Age at first live birth: 48 years old [place] 2 LMP in 2010 Contraceptive: Mirena IUD has been replaced by a ParaGard IUD as of March 2020 She used birth control pills between ages 16 to 66 w/o event HRT n/a  Hysterectomy? no BSO? 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  She has a PhD in Probation officer.  Husband [name] is an Art gallery manager. She lives at home with her husband and 3 children. She has two children, [name] age 32 and [name] age 9. Husband [name] has one daughter, Derita Michelsen age 87, who lives with them.   ADVANCED DIRECTIVES: In the absence of any documentation to the contrary her husband [name] is her HCPOA.   HEALTH MAINTENANCE: Social History   Tobacco Use  . Smoking status: Never Smoker  . Smokeless tobacco: Never Used  Substance Use Topics  . Alcohol use: Yes    Alcohol/week: 3.0 standard drinks    Types: 3 Standard drinks or equivalent per week    Comment: social  . Drug use: Never     Colonoscopy: never done  PAP: 08/2017  Bone density: never done   Allergies  Allergen Reactions  . Septra [Sulfamethoxazole-Trimethoprim]     Current Outpatient Medications  Medication Sig Dispense Refill  . diazepam (VALIUM) 2 MG tablet Take 1 tablet (2 mg total) by mouth every 6 (six) hours as needed for anxiety. 30 tablet 0  . lidocaine-prilocaine (EMLA) cream Apply to affected area once 30 g 3  . loratadine (CLARITIN) 10 MG tablet Take 1 tablet (10 mg total) by mouth daily. 90 tablet 0  . LORazepam (ATIVAN) 0.5 MG tablet Take 1 tablet (0.5 mg total) by mouth at bedtime as needed (Nausea or vomiting). 30 tablet 0  . magic mouthwash w/lidocaine SOLN Take 5 mLs by  mouth 4 (four) times daily as needed for mouth pain. 240 mL 0  . predniSONE (DELTASONE) 5 MG tablet Take 6 tablets with breakfast beginning 02/13/2019, then 5 tablets the next day, 4 tablets the next and so on until done 21 tablet 0  . prochlorperazine (COMPAZINE) 10 MG tablet Take 1 tablet (10 mg total) by mouth every 6 (six) hours as needed (Nausea or vomiting). 30 tablet 1  . valACYclovir (VALTREX) 1000 MG tablet Take 1 tablet (1,000 mg total) by mouth daily. 10 tablet 0   No current facility-administered medications for this visit.     OBJECTIVE: Young white woman who appears stated age  56:   03/27/19 0837  BP: 109/77  Pulse: (!) 106  Resp: 18  Temp: 98.7 F (37.1 C)  SpO2: 100%   Wt Readings from Last 3 Encounters:  03/27/19 149 lb 6.4 oz (67.8 kg)  03/19/19 149 lb 1.6 oz (67.6 kg)  03/12/19 148 lb 14.4 oz (67.5 kg)   Body mass index is 24.11 kg/m.    ECOG FS:0 - Asymptomatic  Sclerae unicteric, EOMs intact Wearing a mask No cervical or supraclavicular adenopathy Lungs no rales or rhonchi Heart regular rate and rhythm Abd soft, nontender, positive bowel sounds MSK no focal spinal tenderness, no  upper extremity lymphedema Neuro: nonfocal, well oriented, appropriate affect Breasts: Deferred     LAB RESULTS:  CMP     Component Value Date/Time   NA 141 03/19/2019 0756   K 4.1 03/19/2019 0756   CL 107 03/19/2019 0756   CO2 25 03/19/2019 0756   GLUCOSE 106 (H) 03/19/2019 0756   BUN 17 03/19/2019 0756   CREATININE 0.79 03/19/2019 0756   CALCIUM 9.1 03/19/2019 0756   PROT 6.5 03/19/2019 0756   ALBUMIN 3.8 03/19/2019 0756   AST 22 03/19/2019 0756   ALT 35 03/19/2019 0756   ALKPHOS 48 03/19/2019 0756   BILITOT 0.3 03/19/2019 0756   GFRNONAA >60 03/19/2019 0756   GFRAA >60 03/19/2019 0756    No results found for: TOTALPROTELP, ALBUMINELP, A1GS, A2GS, BETS, BETA2SER, GAMS, MSPIKE, SPEI  No results found for: KPAFRELGTCHN, LAMBDASER, KAPLAMBRATIO  Lab  Results  Component Value Date   WBC 5.2 03/27/2019   NEUTROABS 3.7 03/27/2019   HGB 12.4 03/27/2019   HCT 38.2 03/27/2019   MCV 99.2 03/27/2019   PLT 316 03/27/2019    No results found for: LABCA2  No components found for: TAVWPV948  No results for input(s): INR in the last 168 hours.  No results found for: LABCA2  No results found for: AXK553  No results found for: ZSM270  No results found for: BEM754  No results found for: CA2729  No components found for: HGQUANT  No results found for: CEA1 / No results found for: CEA1   No results found for: AFPTUMOR  No results found for: CHROMOGRNA  No results found for: PSA1  Appointment on 03/27/2019  Component Date Value Ref Range Status  . WBC Count 03/27/2019 5.2  4.0 - 10.5 K/uL Final  . RBC 03/27/2019 3.85* 3.87 - 5.11 MIL/uL Final  . Hemoglobin 03/27/2019 12.4  12.0 - 15.0 g/dL Final  . HCT 03/27/2019 38.2  36.0 - 46.0 % Final  . MCV 03/27/2019 99.2  80.0 - 100.0 fL Final  . MCH 03/27/2019 32.2  26.0 - 34.0 pg Final  . MCHC 03/27/2019 32.5  30.0 - 36.0 g/dL Final  . RDW 03/27/2019 12.4  11.5 - 15.5 % Final  . Platelet Count 03/27/2019 316  150 - 400 K/uL Final  . nRBC 03/27/2019 0.0  0.0 - 0.2 % Final  . Neutrophils Relative % 03/27/2019 70  % Final  . Neutro Abs 03/27/2019 3.7  1.7 - 7.7 K/uL Final  . Lymphocytes Relative 03/27/2019 18  % Final  . Lymphs Abs 03/27/2019 0.9  0.7 - 4.0 K/uL Final  . Monocytes Relative 03/27/2019 8  % Final  . Monocytes Absolute 03/27/2019 0.4  0.1 - 1.0 K/uL Final  . Eosinophils Relative 03/27/2019 2  % Final  . Eosinophils Absolute 03/27/2019 0.1  0.0 - 0.5 K/uL Final  . Basophils Relative 03/27/2019 1  % Final  . Basophils Absolute 03/27/2019 0.0  0.0 - 0.1 K/uL Final  . Immature Granulocytes 03/27/2019 1  % Final  . Abs Immature Granulocytes 03/27/2019 0.03  0.00 - 0.07 K/uL Final   Performed at [facility], [place] [address] [place], [place] [address]     (this displays the last labs from the last 3 days)  No results found for: TOTALPROTELP, ALBUMINELP, A1GS, A2GS, BETS, BETA2SER, GAMS, MSPIKE, SPEI (this displays SPEP labs)  No results found for: KPAFRELGTCHN, LAMBDASER, KAPLAMBRATIO (kappa/lambda light chains)  No results found for: HGBA, HGBA2QUANT, HGBFQUANT, HGBSQUAN (Hemoglobinopathy evaluation)   No  results found for: LDH  No results found for: IRON, TIBC, IRONPCTSAT (Iron and TIBC)  No results found for: FERRITIN  Urinalysis    Component Value Date/Time   COLORURINE YELLOW 03/05/2019 0912   APPEARANCEUR CLEAR 03/05/2019 0912   LABSPEC 1.017 03/05/2019 0912   PHURINE 5.0 03/05/2019 0912   GLUCOSEU NEGATIVE 03/05/2019 0912   HGBUR NEGATIVE 03/05/2019 0912   BILIRUBINUR NEGATIVE 03/05/2019 0912   KETONESUR NEGATIVE 03/05/2019 0912   PROTEINUR NEGATIVE 03/05/2019 0912   UROBILINOGEN 0.2 01/27/2008 0235   NITRITE NEGATIVE 03/05/2019 0912   LEUKOCYTESUR NEGATIVE 03/05/2019 0912     STUDIES: No results found.   ELIGIBLE FOR AVAILABLE RESEARCH PROTOCOL: no   ASSESSMENT: 48 y.o. [facility] woman status post left breast upper outer quadrant biopsy 10/02/2018 for a clinical T2N0, stage IB invasive lobular breast cancer, grade not stated, estrogen and progesterone receptor positive, HER-2 not amplified, with an MIB-1 of less than 1%  (1) status post left mastectomy and sentinel lymph node sampling 10/18/2018 for a pT3 pN2, stage IIA invasive lobular breast cancer, grade 2, with close but negative margins  (a) 5 of 9 lymph nodes removed had micrometastatic deposits of tumor  (b) immediate expander placement  (c) definitive silicone implant placement scheduled for 04/12/2019  (2) chemotherapy consisting of doxorubicin and cyclophosphamide in dose dense fashion x4 started 11/22/2018, completed 01/02/2019, followed by weekly paclitaxel x12 starting 01/15/2019  (3) adjuvant radiation to follow  (4) antiestrogens to  start at the completion of local treatment  (a) consider goserelin/anastrozole   PLAN: Bessie continues to tolerate paclitaxel well and she is receiving the 10th of originally 12 planned doses.  Because of the upcoming definitive surgery, I will omit cycle 12, and so her last treatment will be 04/02/2019.  I reassured her that this will have no impact on her long-term prognosis  She is delighted that she gets to "ring the bell" next week.  I anticipate the rash will clear once we stop the paclitaxel.  She knows to call for any other issues that may develop before the next visit.  Virgie Dad. Magrinat, MD  03/27/19 8:49 AM Medical Oncology and Hematology [facility] [address] [place], [place] [address] Tel. [phone]    Fax. [phone]   I, Wilburn Mylar, am acting as scribe for Dr. Virgie Dad. Magrinat.  I, Lurline Del MD, have reviewed the above documentation for accuracy and completeness, and I agree with the above.

## 2019-03-27 ENCOUNTER — Other Ambulatory Visit: Payer: 59

## 2019-03-27 ENCOUNTER — Other Ambulatory Visit: Payer: Self-pay

## 2019-03-27 ENCOUNTER — Encounter: Payer: Self-pay | Admitting: *Deleted

## 2019-03-27 ENCOUNTER — Inpatient Hospital Stay: Payer: 59

## 2019-03-27 ENCOUNTER — Inpatient Hospital Stay: Payer: 59 | Attending: Oncology

## 2019-03-27 ENCOUNTER — Inpatient Hospital Stay (HOSPITAL_BASED_OUTPATIENT_CLINIC_OR_DEPARTMENT_OTHER): Payer: 59 | Admitting: Oncology

## 2019-03-27 VITALS — BP 109/77 | HR 106 | Temp 98.7°F | Resp 18 | Ht 66.0 in | Wt 149.4 lb

## 2019-03-27 VITALS — HR 97

## 2019-03-27 DIAGNOSIS — C50412 Malignant neoplasm of upper-outer quadrant of left female breast: Secondary | ICD-10-CM

## 2019-03-27 DIAGNOSIS — Z95828 Presence of other vascular implants and grafts: Secondary | ICD-10-CM

## 2019-03-27 DIAGNOSIS — Z170 Estrogen receptor positive status [ER+]: Secondary | ICD-10-CM

## 2019-03-27 DIAGNOSIS — Z5111 Encounter for antineoplastic chemotherapy: Secondary | ICD-10-CM | POA: Diagnosis not present

## 2019-03-27 LAB — CMP (CANCER CENTER ONLY)
ALT: 56 U/L — ABNORMAL HIGH (ref 0–44)
AST: 35 U/L (ref 15–41)
Albumin: 3.9 g/dL (ref 3.5–5.0)
Alkaline Phosphatase: 48 U/L (ref 38–126)
Anion gap: 10 (ref 5–15)
BUN: 14 mg/dL (ref 6–20)
CO2: 24 mmol/L (ref 22–32)
Calcium: 9.1 mg/dL (ref 8.9–10.3)
Chloride: 109 mmol/L (ref 98–111)
Creatinine: 0.82 mg/dL (ref 0.44–1.00)
GFR, Est AFR Am: >60 mL/min (ref >60)
GFR, Estimated: >60 mL/min (ref >60)
Glucose, Bld: 108 mg/dL — ABNORMAL HIGH (ref 70–99)
Potassium: 4.3 mmol/L (ref 3.5–5.1)
Sodium: 143 mmol/L (ref 135–145)
Total Bilirubin: <0.2 mg/dL — ABNORMAL LOW (ref 0.3–1.2)
Total Protein: 6.4 g/dL — ABNORMAL LOW (ref 6.5–8.1)

## 2019-03-27 LAB — CBC WITH DIFFERENTIAL (CANCER CENTER ONLY)
Abs Immature Granulocytes: 0.03 10*3/uL (ref 0.00–0.07)
Basophils Absolute: 0 10*3/uL (ref 0.0–0.1)
Basophils Relative: 1 %
Eosinophils Absolute: 0.1 10*3/uL (ref 0.0–0.5)
Eosinophils Relative: 2 %
HCT: 38.2 % (ref 36.0–46.0)
Hemoglobin: 12.4 g/dL (ref 12.0–15.0)
Immature Granulocytes: 1 %
Lymphocytes Relative: 18 %
Lymphs Abs: 0.9 10*3/uL (ref 0.7–4.0)
MCH: 32.2 pg (ref 26.0–34.0)
MCHC: 32.5 g/dL (ref 30.0–36.0)
MCV: 99.2 fL (ref 80.0–100.0)
Monocytes Absolute: 0.4 10*3/uL (ref 0.1–1.0)
Monocytes Relative: 8 %
Neutro Abs: 3.7 10*3/uL (ref 1.7–7.7)
Neutrophils Relative %: 70 %
Platelet Count: 316 10*3/uL (ref 150–400)
RBC: 3.85 MIL/uL — ABNORMAL LOW (ref 3.87–5.11)
RDW: 12.4 % (ref 11.5–15.5)
WBC Count: 5.2 10*3/uL (ref 4.0–10.5)
nRBC: 0 % (ref 0.0–0.2)

## 2019-03-27 MED ORDER — FAMOTIDINE IN NACL 20-0.9 MG/50ML-% IV SOLN
20.0000 mg | Freq: Once | INTRAVENOUS | Status: AC
  Administered 2019-03-27: 20 mg via INTRAVENOUS

## 2019-03-27 MED ORDER — HEPARIN SOD (PORK) LOCK FLUSH 100 UNIT/ML IV SOLN
500.0000 [IU] | Freq: Once | INTRAVENOUS | Status: AC | PRN
  Administered 2019-03-27: 12:00:00 500 [IU]
  Filled 2019-03-27: qty 5

## 2019-03-27 MED ORDER — DIPHENHYDRAMINE HCL 50 MG/ML IJ SOLN
INTRAMUSCULAR | Status: AC
  Filled 2019-03-27: qty 1

## 2019-03-27 MED ORDER — SODIUM CHLORIDE 0.9 % IV SOLN
Freq: Once | INTRAVENOUS | Status: AC
  Administered 2019-03-27: 09:00:00 via INTRAVENOUS
  Filled 2019-03-27: qty 250

## 2019-03-27 MED ORDER — FAMOTIDINE IN NACL 20-0.9 MG/50ML-% IV SOLN
INTRAVENOUS | Status: AC
  Filled 2019-03-27: qty 50

## 2019-03-27 MED ORDER — SODIUM CHLORIDE 0.9 % IV SOLN
80.0000 mg/m2 | Freq: Once | INTRAVENOUS | Status: AC
  Administered 2019-03-27: 138 mg via INTRAVENOUS
  Filled 2019-03-27: qty 23

## 2019-03-27 MED ORDER — SODIUM CHLORIDE 0.9% FLUSH
10.0000 mL | Freq: Once | INTRAVENOUS | Status: AC
  Administered 2019-03-27: 10 mL
  Filled 2019-03-27: qty 10

## 2019-03-27 MED ORDER — DIPHENHYDRAMINE HCL 50 MG/ML IJ SOLN
25.0000 mg | Freq: Once | INTRAMUSCULAR | Status: AC
  Administered 2019-03-27: 25 mg via INTRAVENOUS

## 2019-03-27 MED ORDER — DEXAMETHASONE SODIUM PHOSPHATE 10 MG/ML IJ SOLN
INTRAMUSCULAR | Status: AC
  Filled 2019-03-27: qty 1

## 2019-03-27 MED ORDER — DEXAMETHASONE SODIUM PHOSPHATE 10 MG/ML IJ SOLN
4.0000 mg | Freq: Once | INTRAMUSCULAR | Status: AC
  Administered 2019-03-27: 4 mg via INTRAVENOUS

## 2019-03-27 MED ORDER — SODIUM CHLORIDE 0.9% FLUSH
10.0000 mL | INTRAVENOUS | Status: DC | PRN
  Administered 2019-03-27: 10 mL
  Filled 2019-03-27: qty 10

## 2019-03-27 NOTE — Patient Instructions (Signed)
[facility] Discharge Instructions for Patients Receiving Chemotherapy  Today you received the following chemotherapy agents Paclitaxel (TAXOL).  To help prevent nausea and vomiting after your treatment, we encourage you to take your nausea medication as prescribed.  If you develop nausea and vomiting that is not controlled by your nausea medication, call the clinic.   BELOW ARE SYMPTOMS THAT SHOULD BE REPORTED IMMEDIATELY:  *FEVER GREATER THAN 100.5 F  *CHILLS WITH OR WITHOUT FEVER  NAUSEA AND VOMITING THAT IS NOT CONTROLLED WITH YOUR NAUSEA MEDICATION  *UNUSUAL SHORTNESS OF BREATH  *UNUSUAL BRUISING OR BLEEDING  TENDERNESS IN MOUTH AND THROAT WITH OR WITHOUT PRESENCE OF ULCERS  *URINARY PROBLEMS  *BOWEL PROBLEMS  UNUSUAL RASH Items with * indicate a potential emergency and should be followed up as soon as possible.  Feel free to call the clinic should you have any questions or concerns. The clinic phone number is [phone].  Please show the CHEMO ALERT CARD at check-in to the Emergency Department and triage nurse.  Coronavirus (COVID-19) Are you at risk?  Are you at risk for the Coronavirus (COVID-19)?  To be considered HIGH RISK for Coronavirus (COVID-19), you have to meet the following criteria:  . Traveled to [place], [place], [place], [place] or [place]; or in the [place] to [place], [place], [place], or [place]; and have fever, cough, and shortness of breath within the last 2 weeks of travel OR . Been in close contact with a person diagnosed with COVID-19 within the last 2 weeks and have fever, cough, and shortness of breath . IF YOU DO NOT MEET THESE CRITERIA, YOU ARE CONSIDERED LOW RISK FOR COVID-19.  What to do if you are HIGH RISK for COVID-19?  . If you are having a medical emergency, call 911. . Seek medical care right away. Before you go to a doctor's office, urgent care or emergency department, call ahead and tell them about  your recent travel, contact with someone diagnosed with COVID-19, and your symptoms. You should receive instructions from your physician's office regarding next steps of care.  . When you arrive at healthcare provider, tell the healthcare staff immediately you have returned from visiting [place], [place], [place], [place] or [place]; or traveled in the [place] to [place], [place], [place], or [place]; in the last two weeks or you have been in close contact with a person diagnosed with COVID-19 in the last 2 weeks.   . Tell the health care staff about your symptoms: fever, cough and shortness of breath. . After you have been seen by a medical provider, you will be either: o Tested for (COVID-19) and discharged home on quarantine except to seek medical care if symptoms worsen, and asked to  - Stay home and avoid contact with others until you get your results (4-5 days)  - Avoid travel on public transportation if possible (such as bus, train, or airplane) or o Sent to the Emergency Department by EMS for evaluation, COVID-19 testing, and possible admission depending on your condition and test results.  What to do if you are LOW RISK for COVID-19?  Reduce your risk of any infection by using the same precautions used for avoiding the common cold or flu:  . Wash your hands often with soap and warm water for at least 20 seconds.  If soap and water are not readily available, use an alcohol-based hand sanitizer with at least 60% alcohol.  . If coughing or sneezing,   cover your mouth and nose by coughing or sneezing into the elbow areas of your shirt or coat, into a tissue or into your sleeve (not your hands). . Avoid shaking hands with others and consider head nods or verbal greetings only. . Avoid touching your eyes, nose, or mouth with unwashed hands.  . Avoid close contact with people who are sick. . Avoid places or events with large numbers of people in one location, like concerts or sporting  events. . Carefully consider travel plans you have or are making. . If you are planning any travel outside or inside the [place], visit the CDC's Travelers' Health webpage for the latest health notices. . If you have some symptoms but not all symptoms, continue to monitor at home and seek medical attention if your symptoms worsen. . If you are having a medical emergency, call 911.   ADDITIONAL HEALTHCARE OPTIONS FOR PATIENTS  [facility] / e-Visit: [email or web address]         [facility]: [phone]  [facility]: [phone]                   [facility]: [phone]     

## 2019-03-30 ENCOUNTER — Encounter: Payer: Self-pay | Admitting: Plastic Surgery

## 2019-03-30 ENCOUNTER — Ambulatory Visit (INDEPENDENT_AMBULATORY_CARE_PROVIDER_SITE_OTHER): Payer: 59 | Admitting: Plastic Surgery

## 2019-03-30 ENCOUNTER — Other Ambulatory Visit: Payer: Self-pay

## 2019-03-30 VITALS — BP 110/72 | HR 88 | Ht 66.0 in | Wt 147.0 lb

## 2019-03-30 DIAGNOSIS — Z170 Estrogen receptor positive status [ER+]: Secondary | ICD-10-CM

## 2019-03-30 DIAGNOSIS — C50412 Malignant neoplasm of upper-outer quadrant of left female breast: Secondary | ICD-10-CM

## 2019-03-30 DIAGNOSIS — Z95828 Presence of other vascular implants and grafts: Secondary | ICD-10-CM

## 2019-03-30 DIAGNOSIS — Z9012 Acquired absence of left breast and nipple: Secondary | ICD-10-CM

## 2019-03-30 MED ORDER — ONDANSETRON HCL 4 MG PO TABS: 4.0000 mg | ORAL_TABLET | Freq: Three times a day (TID) | ORAL | 0 refills | Status: DC | PRN

## 2019-03-30 MED ORDER — HYDROCODONE-ACETAMINOPHEN 5-325 MG PO TABS: 1.0000 | ORAL_TABLET | Freq: Four times a day (QID) | ORAL | 0 refills | Status: AC | PRN

## 2019-03-30 MED ORDER — CEPHALEXIN 500 MG PO CAPS: 500.0000 mg | ORAL_CAPSULE | Freq: Four times a day (QID) | ORAL | 0 refills | Status: AC

## 2019-03-30 MED ORDER — DIAZEPAM 2 MG PO TABS: 2.0000 mg | ORAL_TABLET | Freq: Two times a day (BID) | ORAL | 0 refills | Status: AC | PRN

## 2019-03-30 NOTE — Progress Notes (Signed)
Patient ID: [name], female    DOB: 1970/08/26, 48 y.o.   MRN: [id number]   Chief Complaint  Patient presents with  . Breast Problem    The patient is a 48 yrs old wf here for her history and physical for breast reconstruction.  The patient underwent a left mastectomy nipple sparing with expander placement.  She has done extremely well with her expansion.  She has 1 chemo treatment left.  Her incision is healed well.  There is no sign of infection.  She has 200 cc in the 300 cc expander.  She is excited that her hair is growing back as well.  She also requests removal of her Port-A-Cath.   Review of Systems  Constitutional: Negative for activity change and appetite change.  HENT: Negative.   Eyes: Negative.   Respiratory: Negative for chest tightness and shortness of breath.   Cardiovascular: Negative for leg swelling.  Gastrointestinal: Negative.   Endocrine: Negative.   Genitourinary: Negative.   Skin: Positive for rash.  Psychiatric/Behavioral: Negative.     Past Medical History:  Diagnosis Date  . Cancer ([place]) 10/18/2018   left breast cancer    Past Surgical History:  Procedure Laterality Date  . BREAST RECONSTRUCTION WITH PLACEMENT OF TISSUE EXPANDER AND FLEX HD (ACELLULAR HYDRATED DERMIS) Left 10/18/2018   Procedure: IMMEDIATEVBREAST RECONSTRUCTION WITH PLACEMENT OF TISSUE EXPANDER AND FLEX HD (ACELLULAR HYDRATED DERMIS);  Surgeon: Wallace Going, DO;  Location: [facility];  Service: Plastics;  Laterality: Left;  Marland Kitchen MASTECTOMY    . MASTECTOMY W/ SENTINEL NODE BIOPSY Left 10/18/2018   Procedure: LEFT MASTECTOMY WITH SENTINEL LYMPH NODE BIOPSY;  Surgeon: Jovita Kussmaul, MD;  Location: [facility];  Service: General;  Laterality: Left;  . PORTACATH PLACEMENT Right 11/02/2018   Procedure: INSERTION PORT-A-CATH WITH ULTRASOUND;  Surgeon: Autumn Messing III, MD;  Location: [facility];  Service: General;  Laterality: Right;       Current Outpatient Medications:  .  cephALEXin (KEFLEX) 500 MG capsule, Take 1 capsule (500 mg total) by mouth 4 (four) times daily for 5 days., Disp: 20 capsule, Rfl: 0 .  diazepam (VALIUM) 2 MG tablet, Take 1 tablet (2 mg total) by mouth every 6 (six) hours as needed for anxiety., Disp: 30 tablet, Rfl: 0 .  diazepam (VALIUM) 2 MG tablet, Take 1 tablet (2 mg total) by mouth every 12 (twelve) hours as needed for up to 10 days for muscle spasms., Disp: 20 tablet, Rfl: 0 .  HYDROcodone-acetaminophen (NORCO) 5-325 MG tablet, Take 1 tablet by mouth every 6 (six) hours as needed for up to 7 days for moderate pain or severe pain., Disp: 28 tablet, Rfl: 0 .  lidocaine-prilocaine (EMLA) cream, Apply to affected area once, Disp: 30 g, Rfl: 3 .  loratadine (CLARITIN) 10 MG tablet, Take 1 tablet (10 mg total) by mouth daily., Disp: 90 tablet, Rfl: 0 .  LORazepam (ATIVAN) 0.5 MG tablet, Take 1 tablet (0.5 mg total) by mouth at bedtime as needed (Nausea or vomiting)., Disp: 30 tablet, Rfl: 0 .  magic mouthwash w/lidocaine SOLN, Take 5 mLs by mouth 4 (four) times daily as needed for mouth pain., Disp: 240 mL, Rfl: 0 .  ondansetron (ZOFRAN) 4 MG tablet, Take 1 tablet (4 mg total) by mouth every 8 (eight) hours as needed for up to 5 days for nausea or vomiting., Disp: 15 tablet, Rfl: 0 .  predniSONE (DELTASONE) 5 MG tablet, Take  6 tablets with breakfast beginning 02/13/2019, then 5 tablets the next day, 4 tablets the next and so on until done, Disp: 21 tablet, Rfl: 0 .  prochlorperazine (COMPAZINE) 10 MG tablet, Take 1 tablet (10 mg total) by mouth every 6 (six) hours as needed (Nausea or vomiting)., Disp: 30 tablet, Rfl: 1 .  valACYclovir (VALTREX) 1000 MG tablet, Take 1 tablet (1,000 mg total) by mouth daily., Disp: 10 tablet, Rfl: 0   Objective:   Vitals:   03/30/19 1058  BP: 110/72  Pulse: 88  SpO2: 100%    Physical Exam Vitals signs and nursing note reviewed.  Constitutional:      Appearance:  Normal appearance.  HENT:     Head: Normocephalic and atraumatic.  Cardiovascular:     Rate and Rhythm: Normal rate.  Pulmonary:     Effort: Pulmonary effort is normal.  Abdominal:     General: Abdomen is flat. There is no distension.     Tenderness: There is no abdominal tenderness.  Skin:    General: Skin is warm.  Neurological:     General: No focal deficit present.     Mental Status: She is alert and oriented to person, place, and time.  Psychiatric:        Mood and Affect: Mood normal.        Behavior: Behavior normal.        Thought Content: Thought content normal.     Assessment & Plan:  Port-A-Cath in place  Acquired absence of left breast  Malignant neoplasm of upper-outer quadrant of left breast in female, estrogen receptor positive ([place])  I will check with Dr. Jana Hakim to be sure it is okay for the Port-A-Cath to be removed.  We will plan on removal of the left breast expander and placement of a silicone implant.  I will get orders in for lab work as well.  Her prescriptions were sent to pharmacy. The risks that can be encountered with and after placement of a breast implan were discussed and include the following but not limited to these: bleeding, infection, delayed healing, anesthesia risks, skin sensation changes, injury to structures including nerves, blood vessels, and muscles which may be temporary or permanent, allergies to tape, suture materials and glues, blood products, topical preparations or injected agents, skin contour irregularities, skin discoloration and swelling, deep vein thrombosis, cardiac and pulmonary complications, pain, which may persist, fluid accumulation, wrinkling of the skin over the expander, changes in nipple or breast sensation, expander leakage or rupture, faulty position of the expander, persistent pain, formation of tight scar tissue around the expander (capsular contracture), possible need for revisional surgery or staged procedures.     [place], DO

## 2019-03-30 NOTE — H&P (View-Only) (Signed)
Patient ID: [name], female    DOB: [birth year], 48 y.o.   MRN: [id number]   Chief Complaint  Patient presents with  . Breast Problem    The patient is a 48 yrs old wf here for her history and physical for breast reconstruction.  The patient underwent a left mastectomy nipple sparing with expander placement.  She has done extremely well with her expansion.  She has 1 chemo treatment left.  Her incision is healed well.  There is no sign of infection.  She has 200 cc in the 300 cc expander.  She is excited that her hair is growing back as well.  She also requests removal of her Port-A-Cath.   Review of Systems  Constitutional: Negative for activity change and appetite change.  HENT: Negative.   Eyes: Negative.   Respiratory: Negative for chest tightness and shortness of breath.   Cardiovascular: Negative for leg swelling.  Gastrointestinal: Negative.   Endocrine: Negative.   Genitourinary: Negative.   Skin: Positive for rash.  Psychiatric/Behavioral: Negative.     Past Medical History:  Diagnosis Date  . Cancer ([place]) 10/18/2018   left breast cancer    Past Surgical History:  Procedure Laterality Date  . BREAST RECONSTRUCTION WITH PLACEMENT OF TISSUE EXPANDER AND FLEX HD (ACELLULAR HYDRATED DERMIS) Left 10/18/2018   Procedure: IMMEDIATEVBREAST RECONSTRUCTION WITH PLACEMENT OF TISSUE EXPANDER AND FLEX HD (ACELLULAR HYDRATED DERMIS);  Surgeon: Wallace Going, DO;  Location: [place];  Service: Plastics;  Laterality: Left;  Marland Kitchen MASTECTOMY    . MASTECTOMY W/ SENTINEL NODE BIOPSY Left 10/18/2018   Procedure: LEFT MASTECTOMY WITH SENTINEL LYMPH NODE BIOPSY;  Surgeon: Jovita Kussmaul, MD;  Location: [place];  Service: General;  Laterality: Left;  . PORTACATH PLACEMENT Right 11/02/2018   Procedure: INSERTION PORT-A-CATH WITH ULTRASOUND;  Surgeon: Autumn Messing III, MD;  Location: [place];  Service: General;  Laterality: Right;       Current Outpatient Medications:  .  cephALEXin (KEFLEX) 500 MG capsule, Take 1 capsule (500 mg total) by mouth 4 (four) times daily for 5 days., Disp: 20 capsule, Rfl: 0 .  diazepam (VALIUM) 2 MG tablet, Take 1 tablet (2 mg total) by mouth every 6 (six) hours as needed for anxiety., Disp: 30 tablet, Rfl: 0 .  diazepam (VALIUM) 2 MG tablet, Take 1 tablet (2 mg total) by mouth every 12 (twelve) hours as needed for up to 10 days for muscle spasms., Disp: 20 tablet, Rfl: 0 .  HYDROcodone-acetaminophen (NORCO) 5-325 MG tablet, Take 1 tablet by mouth every 6 (six) hours as needed for up to 7 days for moderate pain or severe pain., Disp: 28 tablet, Rfl: 0 .  lidocaine-prilocaine (EMLA) cream, Apply to affected area once, Disp: 30 g, Rfl: 3 .  loratadine (CLARITIN) 10 MG tablet, Take 1 tablet (10 mg total) by mouth daily., Disp: 90 tablet, Rfl: 0 .  LORazepam (ATIVAN) 0.5 MG tablet, Take 1 tablet (0.5 mg total) by mouth at bedtime as needed (Nausea or vomiting)., Disp: 30 tablet, Rfl: 0 .  magic mouthwash w/lidocaine SOLN, Take 5 mLs by mouth 4 (four) times daily as needed for mouth pain., Disp: 240 mL, Rfl: 0 .  ondansetron (ZOFRAN) 4 MG tablet, Take 1 tablet (4 mg total) by mouth every 8 (eight) hours as needed for up to 5 days for nausea or vomiting., Disp: 15 tablet, Rfl: 0 .  predniSONE (DELTASONE) 5 MG tablet, Take  6 tablets with breakfast beginning 02/13/2019, then 5 tablets the next day, 4 tablets the next and so on until done, Disp: 21 tablet, Rfl: 0 .  prochlorperazine (COMPAZINE) 10 MG tablet, Take 1 tablet (10 mg total) by mouth every 6 (six) hours as needed (Nausea or vomiting)., Disp: 30 tablet, Rfl: 1 .  valACYclovir (VALTREX) 1000 MG tablet, Take 1 tablet (1,000 mg total) by mouth daily., Disp: 10 tablet, Rfl: 0   Objective:   Vitals:   03/30/19 1058  BP: 110/72  Pulse: 88  SpO2: 100%    Physical Exam Vitals signs and nursing note reviewed.  Constitutional:      Appearance:  Normal appearance.  HENT:     Head: Normocephalic and atraumatic.  Cardiovascular:     Rate and Rhythm: Normal rate.  Pulmonary:     Effort: Pulmonary effort is normal.  Abdominal:     General: Abdomen is flat. There is no distension.     Tenderness: There is no abdominal tenderness.  Skin:    General: Skin is warm.  Neurological:     General: No focal deficit present.     Mental Status: She is alert and oriented to person, place, and time.  Psychiatric:        Mood and Affect: Mood normal.        Behavior: Behavior normal.        Thought Content: Thought content normal.     Assessment & Plan:  Port-A-Cath in place  Acquired absence of left breast  Malignant neoplasm of upper-outer quadrant of left breast in female, estrogen receptor positive ([place])  I will check with Dr. Jana Hakim to be sure it is okay for the Port-A-Cath to be removed.  We will plan on removal of the left breast expander and placement of a silicone implant.  I will get orders in for lab work as well.  Her prescriptions were sent to pharmacy. The risks that can be encountered with and after placement of a breast implan were discussed and include the following but not limited to these: bleeding, infection, delayed healing, anesthesia risks, skin sensation changes, injury to structures including nerves, blood vessels, and muscles which may be temporary or permanent, allergies to tape, suture materials and glues, blood products, topical preparations or injected agents, skin contour irregularities, skin discoloration and swelling, deep vein thrombosis, cardiac and pulmonary complications, pain, which may persist, fluid accumulation, wrinkling of the skin over the expander, changes in nipple or breast sensation, expander leakage or rupture, faulty position of the expander, persistent pain, formation of tight scar tissue around the expander (capsular contracture), possible need for revisional surgery or staged procedures.     Lamar, DO

## 2019-04-01 ENCOUNTER — Other Ambulatory Visit: Payer: Self-pay | Admitting: Oncology

## 2019-04-01 NOTE — Progress Notes (Signed)
[name]  [phone] [phone]    ID: [name] DOB: 1971-02-05  MR#: [id number]  QDI#:[id number]  Patient Care Team: Orpah Melter, MD as PCP - General (Family Medicine) Mauro Kaufmann, RN as Oncology Nurse Navigator Rockwell Germany, RN as Oncology Nurse Navigator Jovita Kussmaul, MD as Consulting Physician (General Surgery) Ranyah Groeneveld, Virgie Dad, MD as Consulting Physician (Oncology) Kyung Rudd, MD as Consulting Physician (Radiation Oncology) Aloha Gell, MD as Consulting Physician (Obstetrics and Gynecology) [name], Loel Lofty, DO as Attending Physician (Plastic Surgery) Chauncey Cruel, MD OTHER MD:   CHIEF COMPLAINT: Estrogen receptor positive breast cancer  CURRENT TREATMENT: Completing adjuvant chemotherapy    INTERVAL HISTORY: [name] returns today for follow-up and treatment of her estrogen receptor positive breast cancer. She was last seen here on 03/27/2019.   She continues on adjuvant chemotherapy consisting of weekly paclitaxel x12. Today is day 1 cycle 11, and also her final cycle.  She has tolerated Taxol remarkably well, specifically with no peripheral neuropathy, minimal nausea, no vomiting, and generally good energy.  She has not had a menstrual period since June 2020.  Since her last visit here, she has not undergone any additional studies. She is scheduled for the removal of her tissue expanders and the placement of her breast implant on 04/12/2019 with Dr. [name].    REVIEW OF SYSTEMS: [name] continues to be appropriately careful regarding the current pandemic.  She is thinking about possibly going back to work in 6 to 8 weeks depending on what the schools decision is but very likely they will remain virtual into January.  She continues to exercise regularly.  She has not had any intercurrent fevers, bleeding, or diarrhea.  Her facial rash is better.  Her rash on the body is about the same or  slightly better.  Detailed review of systems today was noncontributory except as noted   HISTORY OF CURRENT ILLNESS: From the original intake note:  "[name]" presented with left nipple retraction for 1 week with retroareolar mass in the upper outer quadrant. She underwent bilateral diagnostic mammography with CAD and left breast ultrasonography at [facility] on 09/25/2018 showing: 7 mm oval duct in the left breast; no significant abnormalities in the left axilla. She also underwent additional imaging with left breast digital diagnostic mammogram on 10/02/2018 showing: new 0.4 cm cluster of grouped heterogeneous calcifications in the left breast.   Accordingly on 10/02/2018 she proceeded to biopsy of the left breast area in question. The pathology [id number]) from this procedure showed: mammary carcinoma in situ at the 3 o'clock mass, with possible focal microinvasion; invasive and in situ mammary carcinoma at the 12 o'clock mass, immunostain for E-cadherin is negative in the tumor cells, consistent with a lobular phenotype. Prognostic indicators significant for: estrogen receptor, 90% positive, with moderate staining intensity and progesterone receptor, 100% positive, with strong staining intensity. Proliferation marker Ki67 at <1%. HER2 negative (1+).   The patient's subsequent history is as detailed above.    PAST MEDICAL HISTORY: Past Medical History:  Diagnosis Date   Cancer ([place]) 10/18/2018   left breast cancer    PAST SURGICAL HISTORY: Past Surgical History:  Procedure Laterality Date   BREAST RECONSTRUCTION WITH PLACEMENT OF TISSUE EXPANDER AND FLEX HD (ACELLULAR HYDRATED DERMIS) Left 10/18/2018   Procedure: IMMEDIATEVBREAST RECONSTRUCTION WITH PLACEMENT OF TISSUE EXPANDER AND FLEX HD (ACELLULAR HYDRATED DERMIS);  Surgeon: Wallace Going, DO;  Location: [place];  Service: Plastics;  Laterality: Left;  MASTECTOMY     MASTECTOMY W/ SENTINEL NODE BIOPSY Left  10/18/2018   Procedure: LEFT MASTECTOMY WITH SENTINEL LYMPH NODE BIOPSY;  Surgeon: Jovita Kussmaul, MD;  Location: [place];  Service: General;  Laterality: Left;   PORTACATH PLACEMENT Right 11/02/2018   Procedure: INSERTION PORT-A-CATH WITH ULTRASOUND;  Surgeon: Jovita Kussmaul, MD;  Location: [place];  Service: General;  Laterality: Right;    FAMILY HISTORY Family History  Problem Relation Age of Onset   Breast cancer Neg Hx    Ovarian cancer Neg Hx    As of March 2020, patient's father is living and healthy at age 22. Patient's mother is also living and healthy at age 44. The patient denies a family hx of breast or ovarian cancer. She has 1 sister.   GYNECOLOGIC HISTORY:  Menarche: 48 years old Age at first live birth: 48 years old Roe P 2 LMP June 2020 Contraceptive: Mirena IUD has been replaced by a ParaGard IUD as of March 2020 She used birth control pills between ages 46 to 62 w/o event HRT n/a  Hysterectomy? no BSO? 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  She has a PhD in Probation officer.  Husband [name] is an Art gallery manager. She lives at home with her husband and 3 children. She has two children, [name] age 9 and [name] age 63. Husband [name] has one daughter, Ronalda Walpole age 70, who lives with them.   ADVANCED DIRECTIVES: In the absence of any documentation to the contrary her husband [name] is her HCPOA.   HEALTH MAINTENANCE: Social History   Tobacco Use   Smoking status: Never Smoker   Smokeless tobacco: Never Used  Substance Use Topics   Alcohol use: Yes    Alcohol/week: 3.0 standard drinks    Types: 3 Standard drinks or equivalent per week    Comment: social   Drug use: Never     Colonoscopy: never done  PAP: 08/2017  Bone density: never done   Allergies  Allergen Reactions   Septra [Sulfamethoxazole-Trimethoprim]     Current Outpatient Medications    Medication Sig Dispense Refill   cephALEXin (KEFLEX) 500 MG capsule Take 1 capsule (500 mg total) by mouth 4 (four) times daily for 5 days. 20 capsule 0   diazepam (VALIUM) 2 MG tablet Take 1 tablet (2 mg total) by mouth every 6 (six) hours as needed for anxiety. 30 tablet 0   diazepam (VALIUM) 2 MG tablet Take 1 tablet (2 mg total) by mouth every 12 (twelve) hours as needed for up to 10 days for muscle spasms. 20 tablet 0   HYDROcodone-acetaminophen (NORCO) 5-325 MG tablet Take 1 tablet by mouth every 6 (six) hours as needed for up to 7 days for moderate pain or severe pain. 28 tablet 0   lidocaine-prilocaine (EMLA) cream Apply to affected area once 30 g 3   loratadine (CLARITIN) 10 MG tablet Take 1 tablet (10 mg total) by mouth daily. 90 tablet 0   LORazepam (ATIVAN) 0.5 MG tablet Take 1 tablet (0.5 mg total) by mouth at bedtime as needed (Nausea or vomiting). 30 tablet 0   magic mouthwash w/lidocaine SOLN Take 5 mLs by mouth 4 (four) times daily as needed for mouth pain. 240 mL 0   ondansetron (ZOFRAN) 4 MG tablet Take 1 tablet (4 mg total) by mouth every 8 (eight) hours as needed for up to 5 days for nausea or  vomiting. 15 tablet 0   predniSONE (DELTASONE) 5 MG tablet Take 6 tablets with breakfast beginning 02/13/2019, then 5 tablets the next day, 4 tablets the next and so on until done 21 tablet 0   prochlorperazine (COMPAZINE) 10 MG tablet Take 1 tablet (10 mg total) by mouth every 6 (six) hours as needed (Nausea or vomiting). 30 tablet 1   valACYclovir (VALTREX) 1000 MG tablet Take 1 tablet (1,000 mg total) by mouth daily. 10 tablet 0   No current facility-administered medications for this visit.     OBJECTIVE: Young white woman in no acute distress Vitals:   04/02/19 0840  BP: 110/76  Pulse: 86  Resp: 18  Temp: 98.7 F (37.1 C)  SpO2: 100%   Wt Readings from Last 3 Encounters:  04/02/19 150 lb 1.6 oz (68.1 kg)  03/30/19 147 lb (66.7 kg)  03/27/19 149 lb 6.4 oz  (67.8 kg)   Body mass index is 24.23 kg/m.    ECOG FS:1 - Symptomatic but completely ambulatory  Ocular: Sclerae unicteric, pupils round and equal Ear-nose-throat: Wearing a mask Lymphatic: No cervical or supraclavicular adenopathy Lungs no rales or rhonchi Heart regular rate and rhythm Abd soft, nontender, positive bowel sounds MSK no focal spinal tenderness, no joint edema Neuro: non-focal, well-oriented, appropriate affect Breasts: Deferred    LAB RESULTS:  CMP     Component Value Date/Time   NA 143 03/27/2019 0832   K 4.3 03/27/2019 0832   CL 109 03/27/2019 0832   CO2 24 03/27/2019 0832   GLUCOSE 108 (H) 03/27/2019 0832   BUN 14 03/27/2019 0832   CREATININE 0.82 03/27/2019 0832   CALCIUM 9.1 03/27/2019 0832   PROT 6.4 (L) 03/27/2019 0832   ALBUMIN 3.9 03/27/2019 0832   AST 35 03/27/2019 0832   ALT 56 (H) 03/27/2019 0832   ALKPHOS 48 03/27/2019 0832   BILITOT <0.2 (L) 03/27/2019 0832   GFRNONAA >60 03/27/2019 0832   GFRAA >60 03/27/2019 0832    No results found for: TOTALPROTELP, ALBUMINELP, A1GS, A2GS, BETS, BETA2SER, GAMS, MSPIKE, SPEI  No results found for: KPAFRELGTCHN, LAMBDASER, KAPLAMBRATIO  Lab Results  Component Value Date   WBC 5.2 03/27/2019   NEUTROABS 3.7 03/27/2019   HGB 12.4 03/27/2019   HCT 38.2 03/27/2019   MCV 99.2 03/27/2019   PLT 316 03/27/2019    No results found for: LABCA2  No components found for: [id number]  No results for input(s): INR in the last 168 hours.  No results found for: LABCA2  No results found for: HAL937  No results found for: TKW409  No results found for: BDZ329  No results found for: CA2729  No components found for: HGQUANT  No results found for: CEA1 / No results found for: CEA1   No results found for: AFPTUMOR  No results found for: CHROMOGRNA  No results found for: PSA1  No visits with results within 3 Day(s) from this visit.  Latest known visit with results is:  Appointment on 03/27/2019   Component Date Value Ref Range Status   WBC Count 03/27/2019 5.2  4.0 - 10.5 K/uL Final   RBC 03/27/2019 3.85* 3.87 - 5.11 MIL/uL Final   Hemoglobin 03/27/2019 12.4  12.0 - 15.0 g/dL Final   HCT 03/27/2019 38.2  36.0 - 46.0 % Final   MCV 03/27/2019 99.2  80.0 - 100.0 fL Final   MCH 03/27/2019 32.2  26.0 - 34.0 pg Final   MCHC 03/27/2019 32.5  30.0 - 36.0 g/dL Final  RDW 03/27/2019 12.4  11.5 - 15.5 % Final   Platelet Count 03/27/2019 316  150 - 400 K/uL Final   nRBC 03/27/2019 0.0  0.0 - 0.2 % Final   Neutrophils Relative % 03/27/2019 70  % Final   Neutro Abs 03/27/2019 3.7  1.7 - 7.7 K/uL Final   Lymphocytes Relative 03/27/2019 18  % Final   Lymphs Abs 03/27/2019 0.9  0.7 - 4.0 K/uL Final   Monocytes Relative 03/27/2019 8  % Final   Monocytes Absolute 03/27/2019 0.4  0.1 - 1.0 K/uL Final   Eosinophils Relative 03/27/2019 2  % Final   Eosinophils Absolute 03/27/2019 0.1  0.0 - 0.5 K/uL Final   Basophils Relative 03/27/2019 1  % Final   Basophils Absolute 03/27/2019 0.0  0.0 - 0.1 K/uL Final   Immature Granulocytes 03/27/2019 1  % Final   Abs Immature Granulocytes 03/27/2019 0.03  0.00 - 0.07 K/uL Final   Performed at [facility], [place] [address] [place], [place] [address]   Sodium 03/27/2019 143  135 - 145 mmol/L Final   Potassium 03/27/2019 4.3  3.5 - 5.1 mmol/L Final   Chloride 03/27/2019 109  98 - 111 mmol/L Final   CO2 03/27/2019 24  22 - 32 mmol/L Final   Glucose, Bld 03/27/2019 108* 70 - 99 mg/dL Final   BUN 03/27/2019 14  6 - 20 mg/dL Final   Creatinine 03/27/2019 0.82  0.44 - 1.00 mg/dL Final   Calcium 03/27/2019 9.1  8.9 - 10.3 mg/dL Final   Total Protein 03/27/2019 6.4* 6.5 - 8.1 g/dL Final   Albumin 03/27/2019 3.9  3.5 - 5.0 g/dL Final   AST 03/27/2019 35  15 - 41 U/L Final   ALT 03/27/2019 56* 0 - 44 U/L Final   Alkaline Phosphatase 03/27/2019 48  38 - 126 U/L Final   Total Bilirubin 03/27/2019 <0.2* 0.3  - 1.2 mg/dL Final   GFR, Est Non Af Am 03/27/2019 >60  >60 mL/min Final   GFR, Est AFR Am 03/27/2019 >60  >60 mL/min Final   Anion gap 03/27/2019 10  5 - 15 Final   Performed at [facility], [address] [place], [place] [address]    (this displays the last labs from the last 3 days)  No results found for: TOTALPROTELP, ALBUMINELP, A1GS, A2GS, BETS, BETA2SER, GAMS, MSPIKE, SPEI (this displays SPEP labs)  No results found for: KPAFRELGTCHN, LAMBDASER, KAPLAMBRATIO (kappa/lambda light chains)  No results found for: HGBA, HGBA2QUANT, HGBFQUANT, HGBSQUAN (Hemoglobinopathy evaluation)   No results found for: LDH  No results found for: IRON, TIBC, IRONPCTSAT (Iron and TIBC)  No results found for: FERRITIN  Urinalysis    Component Value Date/Time   COLORURINE YELLOW 03/05/2019 0912   APPEARANCEUR CLEAR 03/05/2019 0912   LABSPEC 1.017 03/05/2019 0912   PHURINE 5.0 03/05/2019 0912   GLUCOSEU NEGATIVE 03/05/2019 0912   HGBUR NEGATIVE 03/05/2019 0912   BILIRUBINUR NEGATIVE 03/05/2019 0912   KETONESUR NEGATIVE 03/05/2019 0912   PROTEINUR NEGATIVE 03/05/2019 0912   UROBILINOGEN 0.2 01/27/2008 0235   NITRITE NEGATIVE 03/05/2019 0912   LEUKOCYTESUR NEGATIVE 03/05/2019 0912     STUDIES: No results found.   ELIGIBLE FOR AVAILABLE RESEARCH PROTOCOL: no   ASSESSMENT: 48 y.o. [facility] woman status post left breast upper outer quadrant biopsy 10/02/2018 for a clinical T2N0, stage IB invasive lobular breast cancer, grade not stated, estrogen and progesterone receptor positive, HER-2 not amplified, with an MIB-1 of less than  1%  (1) status post left mastectomy and sentinel lymph node sampling 10/18/2018 for a pT3 pN2, stage IIA invasive lobular breast cancer, grade 2, with close but negative margins  (a) 5 of 9 lymph nodes removed had micrometastatic deposits of tumor  (b) immediate expander placement  (c) definitive silicone implant placement scheduled  for 04/12/2019  (2) chemotherapy consisting of doxorubicin and cyclophosphamide in dose dense fashion x4 started 11/22/2018, completed 01/02/2019, followed by weekly paclitaxel x12 starting 01/15/2019, completed 04/02/2019  (a) received 11 of 12 planned taxol cycles (last cycle d/c'd to accommodate surgery)  (3) adjuvant radiation to follow  (4) antiestrogens to start at the completion of local treatment  (a) consider goserelin/anastrozole   PLAN: Bridie completes her adjuvant chemotherapy today.  Overall she has done remarkably well.  She feels "it has gone by very fast".  She has not had any signs or symptoms suggestive of end-organ damage.  She will have her definitive implant exchange 04/12/2019.  She will then proceed to adjuvant radiation for 6-1/2 weeks.  I am going to see her again late October.  Before that visit we will check her [facility] and estradiol levels in preparation for discussion later this year regarding which antiestrogen she wishes to go on.  We always have the option of goserelin if she wishes to optimize her recurrence risk  She knows to call for any other issue that may develop before her next visit.  [name], Virgie Dad, MD  04/02/19 8:41 AM Medical Oncology and Hematology [facility] [place], [place] [address] Tel. [phone]    Fax. [phone]   I, Jacqualyn Posey am acting as a Education administrator for Chauncey Cruel, MD.   I, Lurline Del MD, have reviewed the above documentation for accuracy and completeness, and I agree with the above.

## 2019-04-02 ENCOUNTER — Inpatient Hospital Stay: Payer: 59

## 2019-04-02 ENCOUNTER — Other Ambulatory Visit: Payer: 59

## 2019-04-02 ENCOUNTER — Inpatient Hospital Stay (HOSPITAL_BASED_OUTPATIENT_CLINIC_OR_DEPARTMENT_OTHER): Payer: 59 | Admitting: Oncology

## 2019-04-02 ENCOUNTER — Telehealth: Payer: Self-pay | Admitting: *Deleted

## 2019-04-02 ENCOUNTER — Other Ambulatory Visit: Payer: Self-pay

## 2019-04-02 VITALS — BP 110/76 | HR 86 | Temp 98.7°F | Resp 18 | Ht 66.0 in | Wt 150.1 lb

## 2019-04-02 DIAGNOSIS — C50412 Malignant neoplasm of upper-outer quadrant of left female breast: Secondary | ICD-10-CM

## 2019-04-02 DIAGNOSIS — Z170 Estrogen receptor positive status [ER+]: Secondary | ICD-10-CM

## 2019-04-02 DIAGNOSIS — Z5111 Encounter for antineoplastic chemotherapy: Secondary | ICD-10-CM | POA: Diagnosis not present

## 2019-04-02 DIAGNOSIS — Z95828 Presence of other vascular implants and grafts: Secondary | ICD-10-CM

## 2019-04-02 LAB — CBC WITH DIFFERENTIAL (CANCER CENTER ONLY)
Abs Immature Granulocytes: 0.01 10*3/uL (ref 0.00–0.07)
Basophils Absolute: 0 10*3/uL (ref 0.0–0.1)
Basophils Relative: 1 %
Eosinophils Absolute: 0.1 10*3/uL (ref 0.0–0.5)
Eosinophils Relative: 3 %
HCT: 38.1 % (ref 36.0–46.0)
Hemoglobin: 12.4 g/dL (ref 12.0–15.0)
Immature Granulocytes: 0 %
Lymphocytes Relative: 23 %
Lymphs Abs: 0.8 10*3/uL (ref 0.7–4.0)
MCH: 32 pg (ref 26.0–34.0)
MCHC: 32.5 g/dL (ref 30.0–36.0)
MCV: 98.4 fL (ref 80.0–100.0)
Monocytes Absolute: 0.3 10*3/uL (ref 0.1–1.0)
Monocytes Relative: 9 %
Neutro Abs: 2.2 10*3/uL (ref 1.7–7.7)
Neutrophils Relative %: 64 %
Platelet Count: 230 10*3/uL (ref 150–400)
RBC: 3.87 MIL/uL (ref 3.87–5.11)
RDW: 12.1 % (ref 11.5–15.5)
WBC Count: 3.5 10*3/uL — ABNORMAL LOW (ref 4.0–10.5)
nRBC: 0 % (ref 0.0–0.2)

## 2019-04-02 LAB — CMP (CANCER CENTER ONLY)
ALT: 49 U/L — ABNORMAL HIGH (ref 0–44)
AST: 27 U/L (ref 15–41)
Albumin: 4 g/dL (ref 3.5–5.0)
Alkaline Phosphatase: 51 U/L (ref 38–126)
Anion gap: 10 (ref 5–15)
BUN: 13 mg/dL (ref 6–20)
CO2: 24 mmol/L (ref 22–32)
Calcium: 9.2 mg/dL (ref 8.9–10.3)
Chloride: 107 mmol/L (ref 98–111)
Creatinine: 0.81 mg/dL (ref 0.44–1.00)
GFR, Est AFR Am: >60 mL/min (ref >60)
GFR, Estimated: >60 mL/min (ref >60)
Glucose, Bld: 85 mg/dL (ref 70–99)
Potassium: 4.4 mmol/L (ref 3.5–5.1)
Sodium: 141 mmol/L (ref 135–145)
Total Bilirubin: 0.3 mg/dL (ref 0.3–1.2)
Total Protein: 6.5 g/dL (ref 6.5–8.1)

## 2019-04-02 MED ORDER — HEPARIN SOD (PORK) LOCK FLUSH 100 UNIT/ML IV SOLN
500.0000 [IU] | Freq: Once | INTRAVENOUS | Status: AC | PRN
  Administered 2019-04-02: 500 [IU]
  Filled 2019-04-02: qty 5

## 2019-04-02 MED ORDER — SODIUM CHLORIDE 0.9 % IV SOLN
80.0000 mg/m2 | Freq: Once | INTRAVENOUS | Status: AC
  Administered 2019-04-02: 10:00:00 138 mg via INTRAVENOUS
  Filled 2019-04-02: qty 23

## 2019-04-02 MED ORDER — SODIUM CHLORIDE 0.9% FLUSH
10.0000 mL | INTRAVENOUS | Status: DC | PRN
  Administered 2019-04-02: 12:00:00 10 mL
  Filled 2019-04-02: qty 10

## 2019-04-02 MED ORDER — DIPHENHYDRAMINE HCL 50 MG/ML IJ SOLN
INTRAMUSCULAR | Status: AC
  Filled 2019-04-02: qty 1

## 2019-04-02 MED ORDER — DIPHENHYDRAMINE HCL 50 MG/ML IJ SOLN
25.0000 mg | Freq: Once | INTRAMUSCULAR | Status: AC
  Administered 2019-04-02: 09:00:00 25 mg via INTRAVENOUS

## 2019-04-02 MED ORDER — FAMOTIDINE IN NACL 20-0.9 MG/50ML-% IV SOLN
INTRAVENOUS | Status: AC
  Filled 2019-04-02: qty 50

## 2019-04-02 MED ORDER — SODIUM CHLORIDE 0.9 % IV SOLN
Freq: Once | INTRAVENOUS | Status: AC
  Administered 2019-04-02: 09:00:00 via INTRAVENOUS
  Filled 2019-04-02: qty 250

## 2019-04-02 MED ORDER — DEXAMETHASONE SODIUM PHOSPHATE 10 MG/ML IJ SOLN
4.0000 mg | Freq: Once | INTRAMUSCULAR | Status: AC
  Administered 2019-04-02: 4 mg via INTRAVENOUS

## 2019-04-02 MED ORDER — DEXAMETHASONE SODIUM PHOSPHATE 10 MG/ML IJ SOLN
INTRAMUSCULAR | Status: AC
  Filled 2019-04-02: qty 1

## 2019-04-02 MED ORDER — FAMOTIDINE IN NACL 20-0.9 MG/50ML-% IV SOLN
20.0000 mg | Freq: Once | INTRAVENOUS | Status: AC
  Administered 2019-04-02: 20 mg via INTRAVENOUS

## 2019-04-02 MED ORDER — SODIUM CHLORIDE 0.9% FLUSH
10.0000 mL | Freq: Once | INTRAVENOUS | Status: AC
  Administered 2019-04-02: 10 mL
  Filled 2019-04-02: qty 10

## 2019-04-02 NOTE — Patient Instructions (Signed)
[facility] Discharge Instructions for Patients Receiving Chemotherapy  Today you received the following chemotherapy agents Paclitaxel (TAXOL).  To help prevent nausea and vomiting after your treatment, we encourage you to take your nausea medication as prescribed.  If you develop nausea and vomiting that is not controlled by your nausea medication, call the clinic.   BELOW ARE SYMPTOMS THAT SHOULD BE REPORTED IMMEDIATELY:  *FEVER GREATER THAN 100.5 F  *CHILLS WITH OR WITHOUT FEVER  NAUSEA AND VOMITING THAT IS NOT CONTROLLED WITH YOUR NAUSEA MEDICATION  *UNUSUAL SHORTNESS OF BREATH  *UNUSUAL BRUISING OR BLEEDING  TENDERNESS IN MOUTH AND THROAT WITH OR WITHOUT PRESENCE OF ULCERS  *URINARY PROBLEMS  *BOWEL PROBLEMS  UNUSUAL RASH Items with * indicate a potential emergency and should be followed up as soon as possible.  Feel free to call the clinic should you have any questions or concerns. The clinic phone number is [phone].  Please show the CHEMO ALERT CARD at check-in to the Emergency Department and triage nurse.  Coronavirus (COVID-19) Are you at risk?  Are you at risk for the Coronavirus (COVID-19)?  To be considered HIGH RISK for Coronavirus (COVID-19), you have to meet the following criteria:  . Traveled to [place], [place], [place], [place] or [place]; or in the [place] to [place], [place], [place], or [place]; and have fever, cough, and shortness of breath within the last 2 weeks of travel OR . Been in close contact with a person diagnosed with COVID-19 within the last 2 weeks and have fever, cough, and shortness of breath . IF YOU DO NOT MEET THESE CRITERIA, YOU ARE CONSIDERED LOW RISK FOR COVID-19.  What to do if you are HIGH RISK for COVID-19?  . If you are having a medical emergency, call 911. . Seek medical care right away. Before you go to a doctor's office, urgent care or emergency department, call ahead and tell them about  your recent travel, contact with someone diagnosed with COVID-19, and your symptoms. You should receive instructions from your physician's office regarding next steps of care.  . When you arrive at healthcare provider, tell the healthcare staff immediately you have returned from visiting [place], [place], [place], [place] or [place]; or traveled in the [place] to [place], [place], [place], or [place]; in the last two weeks or you have been in close contact with a person diagnosed with COVID-19 in the last 2 weeks.   . Tell the health care staff about your symptoms: fever, cough and shortness of breath. . After you have been seen by a medical provider, you will be either: o Tested for (COVID-19) and discharged home on quarantine except to seek medical care if symptoms worsen, and asked to  - Stay home and avoid contact with others until you get your results (4-5 days)  - Avoid travel on public transportation if possible (such as bus, train, or airplane) or o Sent to the Emergency Department by EMS for evaluation, COVID-19 testing, and possible admission depending on your condition and test results.  What to do if you are LOW RISK for COVID-19?  Reduce your risk of any infection by using the same precautions used for avoiding the common cold or flu:  . Wash your hands often with soap and warm water for at least 20 seconds.  If soap and water are not readily available, use an alcohol-based hand sanitizer with at least 60% alcohol.  . If coughing or sneezing,   cover your mouth and nose by coughing or sneezing into the elbow areas of your shirt or coat, into a tissue or into your sleeve (not your hands). . Avoid shaking hands with others and consider head nods or verbal greetings only. . Avoid touching your eyes, nose, or mouth with unwashed hands.  . Avoid close contact with people who are sick. . Avoid places or events with large numbers of people in one location, like concerts or sporting  events. . Carefully consider travel plans you have or are making. . If you are planning any travel outside or inside the [place], visit the CDC's Travelers' Health webpage for the latest health notices. . If you have some symptoms but not all symptoms, continue to monitor at home and seek medical attention if your symptoms worsen. . If you are having a medical emergency, call 911.   ADDITIONAL HEALTHCARE OPTIONS FOR PATIENTS  [facility] / e-Visit: [email or web address]         [facility]: [phone]  [facility]: [phone]                   [facility]: [phone]     

## 2019-04-02 NOTE — Telephone Encounter (Signed)
Called pt to congratulate on completion of chemo and to assess for needs or questions. VM left for pt to return call. Contact information provided.

## 2019-04-03 ENCOUNTER — Telehealth: Payer: Self-pay | Admitting: Oncology

## 2019-04-03 NOTE — Telephone Encounter (Signed)
I left a message regarding schedule  

## 2019-04-05 ENCOUNTER — Encounter (HOSPITAL_BASED_OUTPATIENT_CLINIC_OR_DEPARTMENT_OTHER): Payer: Self-pay | Admitting: *Deleted

## 2019-04-05 ENCOUNTER — Other Ambulatory Visit: Payer: Self-pay

## 2019-04-09 ENCOUNTER — Other Ambulatory Visit (HOSPITAL_COMMUNITY)
Admission: RE | Admit: 2019-04-09 | Discharge: 2019-04-09 | Disposition: A | Discharge status: Home / Self Care | Payer: 59 | Source: Ambulatory Visit | Attending: Plastic Surgery | Admitting: Plastic Surgery

## 2019-04-09 DIAGNOSIS — Z20828 Contact with and (suspected) exposure to other viral communicable diseases: Secondary | ICD-10-CM | POA: Diagnosis not present

## 2019-04-09 DIAGNOSIS — Z170 Estrogen receptor positive status [ER+]: Secondary | ICD-10-CM | POA: Diagnosis not present

## 2019-04-09 DIAGNOSIS — Z01812 Encounter for preprocedural laboratory examination: Secondary | ICD-10-CM | POA: Insufficient documentation

## 2019-04-09 DIAGNOSIS — C50412 Malignant neoplasm of upper-outer quadrant of left female breast: Secondary | ICD-10-CM | POA: Diagnosis not present

## 2019-04-11 LAB — NOVEL CORONAVIRUS, NAA (HOSP ORDER, SEND-OUT TO REF LAB; TAT 18-24 HRS): SARS-CoV-2, NAA: NOT DETECTED

## 2019-04-12 ENCOUNTER — Encounter (HOSPITAL_BASED_OUTPATIENT_CLINIC_OR_DEPARTMENT_OTHER): Payer: Self-pay | Admitting: Anesthesiology

## 2019-04-12 ENCOUNTER — Ambulatory Visit (HOSPITAL_BASED_OUTPATIENT_CLINIC_OR_DEPARTMENT_OTHER): Payer: 59 | Admitting: Anesthesiology

## 2019-04-12 ENCOUNTER — Ambulatory Visit (HOSPITAL_BASED_OUTPATIENT_CLINIC_OR_DEPARTMENT_OTHER)
Admission: RE | Admit: 2019-04-12 | Discharge: 2019-04-12 | Disposition: A | Discharge status: Home / Self Care | Payer: 59 | Attending: Plastic Surgery | Admitting: Plastic Surgery

## 2019-04-12 ENCOUNTER — Encounter (HOSPITAL_BASED_OUTPATIENT_CLINIC_OR_DEPARTMENT_OTHER)
Admission: RE | Disposition: A | Discharge status: Home / Self Care | Payer: Self-pay | Source: Home / Self Care | Attending: Plastic Surgery

## 2019-04-12 ENCOUNTER — Other Ambulatory Visit: Payer: Self-pay

## 2019-04-12 DIAGNOSIS — Z853 Personal history of malignant neoplasm of breast: Secondary | ICD-10-CM | POA: Insufficient documentation

## 2019-04-12 DIAGNOSIS — Z9012 Acquired absence of left breast and nipple: Secondary | ICD-10-CM | POA: Insufficient documentation

## 2019-04-12 DIAGNOSIS — Z452 Encounter for adjustment and management of vascular access device: Secondary | ICD-10-CM

## 2019-04-12 DIAGNOSIS — Z79899 Other long term (current) drug therapy: Secondary | ICD-10-CM | POA: Insufficient documentation

## 2019-04-12 DIAGNOSIS — Z421 Encounter for breast reconstruction following mastectomy: Secondary | ICD-10-CM | POA: Diagnosis present

## 2019-04-12 DIAGNOSIS — Z45812 Encounter for adjustment or removal of left breast implant: Secondary | ICD-10-CM

## 2019-04-12 HISTORY — PX: REMOVAL OF TISSUE EXPANDER AND PLACEMENT OF IMPLANT: SHX6457

## 2019-04-12 HISTORY — PX: PORT-A-CATH REMOVAL: SHX5289

## 2019-04-12 SURGERY — REMOVAL, TISSUE EXPANDER, BREAST, WITH IMPLANT INSERTION
Anesthesia: General | Site: Chest | Laterality: Right

## 2019-04-12 MED ORDER — ONDANSETRON HCL 4 MG/2ML IJ SOLN
INTRAMUSCULAR | Status: DC | PRN
  Administered 2019-04-12: 4 mg via INTRAVENOUS

## 2019-04-12 MED ORDER — ACETAMINOPHEN 650 MG RE SUPP: 650.0000 mg | RECTAL | Status: DC | PRN

## 2019-04-12 MED ORDER — CHLORHEXIDINE GLUCONATE CLOTH 2 % EX PADS: 6.0000 | MEDICATED_PAD | Freq: Once | CUTANEOUS | Status: DC

## 2019-04-12 MED ORDER — PHENYLEPHRINE 40 MCG/ML (10ML) SYRINGE FOR IV PUSH (FOR BLOOD PRESSURE SUPPORT)
PREFILLED_SYRINGE | INTRAVENOUS | Status: AC
  Filled 2019-04-12: qty 10

## 2019-04-12 MED ORDER — OXYCODONE HCL 5 MG PO TABS: 5.0000 mg | ORAL_TABLET | Freq: Once | ORAL | Status: DC | PRN

## 2019-04-12 MED ORDER — SCOPOLAMINE 1 MG/3DAYS TD PT72
MEDICATED_PATCH | TRANSDERMAL | Status: AC
  Filled 2019-04-12: qty 1

## 2019-04-12 MED ORDER — SODIUM CHLORIDE 0.9% FLUSH: 3.0000 mL | Freq: Two times a day (BID) | INTRAVENOUS | Status: DC

## 2019-04-12 MED ORDER — SODIUM CHLORIDE 0.9 % IV SOLN: 250.0000 mL | INTRAVENOUS | Status: DC | PRN

## 2019-04-12 MED ORDER — PHENYLEPHRINE HCL (PRESSORS) 10 MG/ML IV SOLN
INTRAVENOUS | Status: DC | PRN
  Administered 2019-04-12: 80 ug via INTRAVENOUS

## 2019-04-12 MED ORDER — CEFAZOLIN SODIUM-DEXTROSE 2-4 GM/100ML-% IV SOLN
2.0000 g | INTRAVENOUS | Status: AC
  Administered 2019-04-12: 2 g via INTRAVENOUS

## 2019-04-12 MED ORDER — BUPIVACAINE-EPINEPHRINE 0.5% -1:200000 IJ SOLN
INTRAMUSCULAR | Status: DC | PRN
  Administered 2019-04-12: 8 mL

## 2019-04-12 MED ORDER — MIDAZOLAM HCL 2 MG/2ML IJ SOLN
1.0000 mg | INTRAMUSCULAR | Status: DC | PRN
  Administered 2019-04-12: 2 mg via INTRAVENOUS

## 2019-04-12 MED ORDER — ONDANSETRON HCL 4 MG/2ML IJ SOLN
INTRAMUSCULAR | Status: AC
  Filled 2019-04-12: qty 2

## 2019-04-12 MED ORDER — SODIUM CHLORIDE 0.9 % IV SOLN
INTRAVENOUS | Status: DC | PRN
  Administered 2019-04-12: 10:00:00 500 mL

## 2019-04-12 MED ORDER — SCOPOLAMINE 1 MG/3DAYS TD PT72
1.0000 | MEDICATED_PATCH | TRANSDERMAL | Status: DC
  Administered 2019-04-12: 1.5 mg via TRANSDERMAL

## 2019-04-12 MED ORDER — EPHEDRINE 5 MG/ML INJ
INTRAVENOUS | Status: AC
  Filled 2019-04-12: qty 10

## 2019-04-12 MED ORDER — ACETAMINOPHEN 325 MG PO TABS: 650.0000 mg | ORAL_TABLET | ORAL | Status: DC | PRN

## 2019-04-12 MED ORDER — OXYCODONE HCL 5 MG PO TABS: 5.0000 mg | ORAL_TABLET | ORAL | Status: DC | PRN

## 2019-04-12 MED ORDER — SODIUM CHLORIDE 0.9% FLUSH: 3.0000 mL | INTRAVENOUS | Status: DC | PRN

## 2019-04-12 MED ORDER — LIDOCAINE 2% (20 MG/ML) 5 ML SYRINGE
INTRAMUSCULAR | Status: AC
  Filled 2019-04-12: qty 5

## 2019-04-12 MED ORDER — CEFAZOLIN SODIUM-DEXTROSE 2-4 GM/100ML-% IV SOLN
INTRAVENOUS | Status: AC
  Filled 2019-04-12: qty 100

## 2019-04-12 MED ORDER — DEXAMETHASONE SODIUM PHOSPHATE 10 MG/ML IJ SOLN
INTRAMUSCULAR | Status: AC
  Filled 2019-04-12: qty 1

## 2019-04-12 MED ORDER — ONDANSETRON HCL 4 MG/2ML IJ SOLN: 4.0000 mg | Freq: Once | INTRAMUSCULAR | Status: DC | PRN

## 2019-04-12 MED ORDER — DEXAMETHASONE SODIUM PHOSPHATE 4 MG/ML IJ SOLN
INTRAMUSCULAR | Status: DC | PRN
  Administered 2019-04-12: 4 mg via INTRAVENOUS

## 2019-04-12 MED ORDER — SUCCINYLCHOLINE CHLORIDE 200 MG/10ML IV SOSY
PREFILLED_SYRINGE | INTRAVENOUS | Status: AC
  Filled 2019-04-12: qty 10

## 2019-04-12 MED ORDER — FENTANYL CITRATE (PF) 100 MCG/2ML IJ SOLN
INTRAMUSCULAR | Status: AC
  Filled 2019-04-12: qty 2

## 2019-04-12 MED ORDER — MIDAZOLAM HCL 2 MG/2ML IJ SOLN
INTRAMUSCULAR | Status: AC
  Filled 2019-04-12: qty 2

## 2019-04-12 MED ORDER — MORPHINE SULFATE (PF) 4 MG/ML IV SOLN: 2.0000 mg | INTRAVENOUS | Status: DC | PRN

## 2019-04-12 MED ORDER — LACTATED RINGERS IV SOLN
INTRAVENOUS | Status: DC
  Administered 2019-04-12 (×2): via INTRAVENOUS

## 2019-04-12 MED ORDER — OXYCODONE HCL 5 MG/5ML PO SOLN: 5.0000 mg | Freq: Once | ORAL | Status: DC | PRN

## 2019-04-12 MED ORDER — LIDOCAINE HCL (CARDIAC) PF 100 MG/5ML IV SOSY
PREFILLED_SYRINGE | INTRAVENOUS | Status: DC | PRN
  Administered 2019-04-12: 50 mg via INTRAVENOUS

## 2019-04-12 MED ORDER — HYDROMORPHONE HCL 1 MG/ML IJ SOLN: 0.2500 mg | INTRAMUSCULAR | Status: DC | PRN

## 2019-04-12 MED ORDER — MEPERIDINE HCL 25 MG/ML IJ SOLN: 6.2500 mg | INTRAMUSCULAR | Status: DC | PRN

## 2019-04-12 MED ORDER — FENTANYL CITRATE (PF) 100 MCG/2ML IJ SOLN
50.0000 ug | INTRAMUSCULAR | Status: DC | PRN
  Administered 2019-04-12: 09:00:00 100 ug via INTRAVENOUS

## 2019-04-12 SURGICAL SUPPLY — 75 items
ADH SKN CLS APL DERMABOND .7 (GAUZE/BANDAGES/DRESSINGS) ×2
APL PRP STRL LF DISP 70% ISPRP (MISCELLANEOUS) ×2
BAG DECANTER FOR FLEXI CONT (MISCELLANEOUS) ×4 IMPLANT
BINDER BREAST LRG (GAUZE/BANDAGES/DRESSINGS) IMPLANT
BINDER BREAST MEDIUM (GAUZE/BANDAGES/DRESSINGS) IMPLANT
BINDER BREAST XLRG (GAUZE/BANDAGES/DRESSINGS) IMPLANT
BINDER BREAST XXLRG (GAUZE/BANDAGES/DRESSINGS) IMPLANT
BIOPATCH RED 1 DISK 7.0 (GAUZE/BANDAGES/DRESSINGS) IMPLANT
BIOPATCH RED 1IN DISK 7.0MM (GAUZE/BANDAGES/DRESSINGS)
BLADE SURG 15 STRL LF DISP TIS (BLADE) ×2 IMPLANT
BLADE SURG 15 STRL SS (BLADE) ×8
BNDG GAUZE ELAST 4 BULKY (GAUZE/BANDAGES/DRESSINGS) ×4 IMPLANT
CANISTER SUCT 1200ML W/VALVE (MISCELLANEOUS) ×4 IMPLANT
CHLORAPREP W/TINT 26 (MISCELLANEOUS) ×4 IMPLANT
CLOSURE WOUND 1/2 X4 (GAUZE/BANDAGES/DRESSINGS) ×1
COVER BACK TABLE REUSABLE LG (DRAPES) ×4 IMPLANT
COVER MAYO STAND REUSABLE (DRAPES) ×4 IMPLANT
COVER WAND RF STERILE (DRAPES) IMPLANT
DECANTER SPIKE VIAL GLASS SM (MISCELLANEOUS) IMPLANT
DERMABOND ADVANCED (GAUZE/BANDAGES/DRESSINGS) ×2
DERMABOND ADVANCED .7 DNX12 (GAUZE/BANDAGES/DRESSINGS) IMPLANT
DRAIN CHANNEL 19F RND (DRAIN) IMPLANT
DRAPE LAPAROSCOPIC ABDOMINAL (DRAPES) ×4 IMPLANT
DRSG PAD ABDOMINAL 8X10 ST (GAUZE/BANDAGES/DRESSINGS) ×6 IMPLANT
ELECT BLADE 4.0 EZ CLEAN MEGAD (MISCELLANEOUS) ×4
ELECT COATED BLADE 2.86 ST (ELECTRODE) ×4 IMPLANT
ELECT REM PT RETURN 9FT ADLT (ELECTROSURGICAL) ×4
ELECTRODE BLDE 4.0 EZ CLN MEGD (MISCELLANEOUS) ×2 IMPLANT
ELECTRODE REM PT RTRN 9FT ADLT (ELECTROSURGICAL) ×2 IMPLANT
EVACUATOR SILICONE 100CC (DRAIN) IMPLANT
GAUZE SPONGE 4X4 12PLY STRL LF (GAUZE/BANDAGES/DRESSINGS) IMPLANT
GLOVE BIO SURGEON STRL SZ 6.5 (GLOVE) ×8 IMPLANT
GLOVE BIO SURGEONS STRL SZ 6.5 (GLOVE) ×4
GLOVE BIOGEL PI IND STRL 7.0 (GLOVE) IMPLANT
GLOVE BIOGEL PI INDICATOR 7.0 (GLOVE) ×2
GOWN STRL REUS W/ TWL LRG LVL3 (GOWN DISPOSABLE) ×4 IMPLANT
GOWN STRL REUS W/TWL LRG LVL3 (GOWN DISPOSABLE) ×18 IMPLANT
IMPL GEL 225CC (Breast) IMPLANT
IMPLANT GEL 225CC (Breast) ×4 IMPLANT
IV NS 1000ML (IV SOLUTION)
IV NS 1000ML BAXH (IV SOLUTION) IMPLANT
NDL HYPO 25X1 1.5 SAFETY (NEEDLE) ×2 IMPLANT
NDL SAFETY ECLIPSE 18X1.5 (NEEDLE) ×2 IMPLANT
NEEDLE HYPO 18GX1.5 SHARP (NEEDLE) ×4
NEEDLE HYPO 25X1 1.5 SAFETY (NEEDLE) ×4 IMPLANT
PACK BASIN DAY SURGERY FS (CUSTOM PROCEDURE TRAY) ×4 IMPLANT
PENCIL BUTTON HOLSTER BLD 10FT (ELECTRODE) ×4 IMPLANT
PIN SAFETY STERILE (MISCELLANEOUS) IMPLANT
SIZER BREAST REUSE 225CC (SIZER) ×4
SIZER BREAST REUSE 250CC (SIZER) ×4
SIZER BRST REUSE 225CC (SIZER) IMPLANT
SIZER BRST REUSE P4.3XHI 250CC (SIZER) IMPLANT
SLEEVE SCD COMPRESS KNEE MED (MISCELLANEOUS) ×4 IMPLANT
SPONGE LAP 18X18 RF (DISPOSABLE) ×6 IMPLANT
STRIP CLOSURE SKIN 1/2X4 (GAUZE/BANDAGES/DRESSINGS) ×1 IMPLANT
STRIP SUTURE WOUND CLOSURE 1/2 (SUTURE) IMPLANT
SUT MNCRL AB 3-0 PS2 18 (SUTURE) IMPLANT
SUT MNCRL AB 4-0 PS2 18 (SUTURE) ×4 IMPLANT
SUT MON AB 3-0 SH 27 (SUTURE) ×8
SUT MON AB 3-0 SH27 (SUTURE) ×2 IMPLANT
SUT MON AB 5-0 PS2 18 (SUTURE) ×4 IMPLANT
SUT PDS 3-0 CT2 (SUTURE)
SUT PDS AB 2-0 CT2 27 (SUTURE) IMPLANT
SUT PDS II 3-0 CT2 27 ABS (SUTURE) IMPLANT
SUT SILK 3 0 PS 1 (SUTURE) IMPLANT
SUT VIC AB 3-0 SH 27 (SUTURE)
SUT VIC AB 3-0 SH 27X BRD (SUTURE) ×2 IMPLANT
SUT VICRYL 4-0 PS2 18IN ABS (SUTURE) ×4 IMPLANT
SYR BULB IRRIGATION 50ML (SYRINGE) ×4 IMPLANT
SYR CONTROL 10ML LL (SYRINGE) ×2 IMPLANT
TOWEL GREEN STERILE FF (TOWEL DISPOSABLE) ×6 IMPLANT
TUBE CONNECTING 20'X1/4 (TUBING) ×1
TUBE CONNECTING 20X1/4 (TUBING) ×3 IMPLANT
UNDERPAD 30X36 HEAVY ABSORB (UNDERPADS AND DIAPERS) ×8 IMPLANT
YANKAUER SUCT BULB TIP NO VENT (SUCTIONS) ×4 IMPLANT

## 2019-04-12 NOTE — Transfer of Care (Signed)
Immediate Anesthesia Transfer of Care Note  Patient: [name]  Procedure(s) Performed: REMOVAL OF TISSUE EXPANDER AND PLACEMENT OF IMPLANT (Left Breast) REMOVAL PORT-A-CATH (Right Chest)  Patient Location: PACU  Anesthesia Type:General  Level of Consciousness: awake, alert  and oriented  Airway & Oxygen Therapy: Patient Spontanous Breathing and Patient connected to nasal cannula oxygen  Post-op Assessment: Report given to RN and Post -op Vital signs reviewed and stable  Post vital signs: Reviewed and stable  Last Vitals:  Vitals Value Taken Time  BP    Temp    Pulse 86 04/12/19 1034  Resp 7 04/12/19 1034  SpO2 100 % 04/12/19 1034  Vitals shown include unvalidated device data.  Last Pain:  Vitals:   04/12/19 0819  TempSrc: Oral  PainSc: 0-No pain         Complications: No apparent anesthesia complications

## 2019-04-12 NOTE — Anesthesia Postprocedure Evaluation (Signed)
Anesthesia Post Note  Patient: [name]  Procedure(s) Performed: REMOVAL OF TISSUE EXPANDER AND PLACEMENT OF IMPLANT (Left Breast) REMOVAL PORT-A-CATH (Right Chest)     Patient location during evaluation: PACU Anesthesia Type: General Level of consciousness: awake and alert and oriented Pain management: pain level controlled Vital Signs Assessment: post-procedure vital signs reviewed and stable Respiratory status: spontaneous breathing, nonlabored ventilation and respiratory function stable Cardiovascular status: blood pressure returned to baseline and stable Postop Assessment: no apparent nausea or vomiting Anesthetic complications: no    Last Vitals:  Vitals:   04/12/19 1100 04/12/19 1115  BP: 121/68 120/75  Pulse: 89 75  Resp: 15 16  Temp:  36.6 C  SpO2: 99% 100%    Last Pain:  Vitals:   04/12/19 1115  TempSrc:   PainSc: 0-No pain                 Liset Mcmonigle A.

## 2019-04-12 NOTE — Interval H&P Note (Signed)
History and Physical Interval Note:  04/12/2019 8:56 AM  [name]  has presented today for surgery, with the diagnosis of Malignant Neoplasm Of Upper-outer Quadrant Of Left Breast In Female, Estrogen Receptor Positive.  The various methods of treatment have been discussed with the patient and family. After consideration of risks, benefits and other options for treatment, the patient has consented to  Procedure(s) with comments: REMOVAL OF TISSUE EXPANDER AND PLACEMENT OF IMPLANT (Left) - 90 min, please as a surgical intervention.  The patient's history has been reviewed, patient examined, no change in status, stable for surgery.  I have reviewed the patient's chart and labs.  Questions were answered to the patient's satisfaction.     Loel Lofty [name]

## 2019-04-12 NOTE — Anesthesia Procedure Notes (Signed)
Procedure Name: LMA Insertion Date/Time: 04/12/2019 9:31 AM Performed by: Willa Frater, CRNA Pre-anesthesia Checklist: Patient identified, Emergency Drugs available, Suction available and Patient being monitored Patient Re-evaluated:Patient Re-evaluated prior to induction Oxygen Delivery Method: Circle system utilized Preoxygenation: Pre-oxygenation with 100% oxygen Induction Type: IV induction Ventilation: Mask ventilation without difficulty LMA: LMA inserted LMA Size: 3.0 Grade View: Grade I Number of attempts: 1 Airway Equipment and Method: Bite block Placement Confirmation: positive ETCO2 Tube secured with: Tape Dental Injury: Teeth and Oropharynx as per pre-operative assessment

## 2019-04-12 NOTE — Op Note (Signed)
Op report Unilateral Breast Exchange   DATE OF OPERATION:  04/12/2019  LOCATION: [facility]  SURGICAL DIVISION: Plastic Surgery  PREOPERATIVE DIAGNOSES:  1. History of left breast cancer.  2. Acquired absence of left breast.   POSTOPERATIVE DIAGNOSES:  1. History of left breast cancer.  2. Acquired absence of left breast.   PROCEDURE:  1. Exchange of tissue expander for implant. 2. Capsulotomies for implant respositioning. 3.  Removal of port-a-cath.  SURGEON: Qualyn Oyervides Sanger Zacary Bauer, DO  ASSISTANT: Roetta Sessions, PA and [place], RNFA  ANESTHESIA:  General.   COMPLICATIONS: None.   IMPLANTS: [place] Smooth Round High Profile Gel 225cc. Ref [id number].  Serial Number [id number]  INDICATIONS FOR PROCEDURE:  The patient, [name], is a 48 y.o. female born on 05-01-1971, is here for treatment for further treatment after a mastectomy and placement of a tissue expander. She now presents for exchange of her expander for an implant.  She requires capsulotomies to better position the implant. She also got clearance from Oncology for removal of the port-a-cath. MRN: [id number]  CONSENT:  Informed consent was obtained directly from the patient. Risks, benefits and alternatives were fully discussed. Specific risks including but not limited to bleeding, infection, hematoma, seroma, scarring, pain, implant infection, implant extrusion, capsular contracture, asymmetry, wound healing problems, and need for further surgery were all discussed. The patient did have an ample opportunity to have her questions answered to her satisfaction.   DESCRIPTION OF PROCEDURE:  The patient was taken to the operating room. SCDs were placed and IV antibiotics were given. The patient's chest was prepped and draped in a sterile fashion. A time out was performed and the implants to be used were identified.  One percent Xylocaine with epinephrine was used to infiltrate the  area.   The old mastectomy scar was opened and superior mastectomy and inferior mastectomy flaps were re-raised over the pectoralis major muscle. The pectoralis was split at the ADM junction to expose the tissue expander which was removed. Inspection of the pocket showed a normal healthy capsule and good integration of the biologic matrix.   Circumferential capsulotomies were performed to allow for breast pocket expansion.  Measurements were made to confirm adequate pocket size for the implant dimensions.  Hemostasis was ensured with electrocautery.  The pocket was irrigated with antibiotic solution.  New gloves were placed.  The implant was placed in the pocket and oriented appropriately. The pectoralis major muscle and capsule on the anterior surface were re-closed with a 3-0 running Monocryl suture. The remaining skin was closed with 4-0 Monocryl deep dermal and 5-0 Monocryl subcuticular stitches.    The local was injected at the port-a-cath incision site.  The #15 blade was used to make the skin incision.  The bovie was used to dissect to the port along with tissue scissors.  The 4-0 Vicryl was used to place a purse stitch around the catheter entry site.  The patient was placed in Trendelenburg.  The port was removed with the sutures clipped.  The deep layer was closed with the 5-0 Monocryl followed by the Monocryl running suture.   Dermabond was applied.  A breast binder and ABD was applied.  The patient was allowed to wake from anesthesia and taken to the recovery room in satisfactory condition.   The advanced practice practitioner (APP) assisted throughout the case.  The APP was essential in retraction and counter traction when needed to make the case progress smoothly.  This retraction and assistance  made it possible to see the tissue plans for the procedure.  The assistance was needed for blood control, tissue re-approximation and assisted with closure of the incision site.

## 2019-04-12 NOTE — Anesthesia Preprocedure Evaluation (Signed)
Anesthesia Evaluation  Patient identified by MRN, date of birth, ID band Patient awake    Reviewed: Allergy & Precautions, NPO status , Patient's Chart, lab work & pertinent test results  Airway Mallampati: II  TM Distance: >3 FB Neck ROM: Full    Dental no notable dental hx. (+) Teeth Intact   Pulmonary neg pulmonary ROS,    Pulmonary exam normal breath sounds clear to auscultation       Cardiovascular negative cardio ROS Normal cardiovascular exam Rhythm:Regular Rate:Normal     Neuro/Psych negative neurological ROS  negative psych ROS   GI/Hepatic negative GI ROS, Neg liver ROS,   Endo/Other  Left Breast Ca  Renal/GU negative Renal ROS  negative genitourinary   Musculoskeletal negative musculoskeletal ROS (+)   Abdominal   Peds  Hematology negative hematology ROS (+)   Anesthesia Other Findings   Reproductive/Obstetrics                             Anesthesia Physical Anesthesia Plan  ASA: II  Anesthesia Plan: General   Post-op Pain Management:    Induction: Intravenous  PONV Risk Score and Plan: 4 or greater and Midazolam, Scopolamine patch - Pre-op, Ondansetron, Dexamethasone and Treatment may vary due to age or medical condition  Airway Management Planned: LMA  Additional Equipment:   Intra-op Plan:   Post-operative Plan: Extubation in OR  Informed Consent: I have reviewed the patients History and Physical, chart, labs and discussed the procedure including the risks, benefits and alternatives for the proposed anesthesia with the patient or authorized representative who has indicated his/her understanding and acceptance.     Dental advisory given  Plan Discussed with: CRNA and Surgeon  Anesthesia Plan Comments:         Anesthesia Quick Evaluation

## 2019-04-12 NOTE — Discharge Instructions (Addendum)
INSTRUCTIONS FOR AFTER BREAST SURGERY   You are getting ready to undergo breast surgery.  You will likely have some questions about what to expect following your operation.  The following information will help you and your family understand what to expect when you are discharged from the hospital.  Following these guidelines will help ensure a smooth recovery and reduce risks of complications.   Postoperative instructions include information on: diet, wound care, medications and physical activity.  AFTER SURGERY  Expect to go home after the procedure.  In some cases, you may need to spend one night in the hospital for observation.  DIET Breast surgery does not require a specific diet.  However, I have to mention that the healthier you eat the better your body can start healing. It is important to increasing your protein intake.  This means limiting the foods with sugar and carbohydrates.  Focus on vegetables and some meat.  If you have any liposuction during your procedure be sure to drink water.  If your urine is bright yellow, then it is concentrated, and you need to drink more water.  As a general rule after surgery, you should have 8 ounces of water every hour while awake.  If you find you are persistently nauseated or unable to take in liquids let us know.  NO TOBACCO USE or EXPOSURE.  This will slow your healing process and increase the risk of a wound.  WOUND CARE You can shower the day after surgery. Use fragrance free soap.  Dial, [place] and [place] are usually mild on the skin. If you have steri-strips / tape directly attached to your skin leave them in place. It is OK to get these wet.  No baths, pools or hot tubs for two weeks. We close your incision to leave the smallest and best-looking scar. No ointment or creams on your incisions until given the go ahead.  Especially not Neosporin (Too many skin reactions with this one).  A few weeks after surgery you can use Mederma and start massaging  the scar. We ask you to wear your binder or sports bra for the first 6 weeks around the clock, including while sleeping. This provides added comfort and helps reduce the fluid accumulation at the surgery site.  ACTIVITY No heavy lifting until cleared by the doctor.  This usually means no more than a half-gallon of milk.  It is OK to walk and climb stairs. In fact, moving your legs is very important to decrease your risk of a blood clot.  It will also help keep you from getting deconditioned.  Every 1 to 2 hours get up and walk for 5 minutes. This will help with a quicker recovery back to normal.  Let pain be your guide so you don't do too much.  NO, you cannot do the spring cleaning and don't plan on taking care of anyone else.  This is your time for TLC.  You will be more comfortable if you sleep and rest with your head elevated either with a few pillows under you or in a recliner.  No stomach sleeping for a few months.  WORK Everyone returns to work at different times. As a rough guide, most people take at least 1 - 2 weeks off prior to returning to work. If you need documentation for your job, bring the forms to your postoperative follow up visit.  DRIVING Arrange for someone to bring you home from the hospital.  You may be able to drive  a few days after surgery but not while taking any narcotics or valium.  BOWEL MOVEMENTS Constipation can occur after anesthesia and while taking pain medication.  It is important to stay ahead for your comfort.  We recommend taking Milk of Magnesia (2 tablespoons; twice a day) while taking the pain pills.  SEROMA This is fluid your body tried to put in the surgical site.  This is normal but if it creates tight skinny skin let us know.  It usually decreases in a few weeks.  WHEN TO CALL Call your surgeon's office if any of the following occur:  Fever 101 degrees F or greater  Excessive bleeding or fluid from the incision site.  Pain that increases over  time without aid from the medications  Redness, warmth, or pus draining from incision sites  Persistent nausea or inability to take in liquids  Severe misshapen area that underwent the operation.     Post Anesthesia Home Care Instructions  Activity: Get plenty of rest for the remainder of the day. A responsible individual must stay with you for 24 hours following the procedure.  For the next 24 hours, DO NOT: -Drive a car -Paediatric nurse -Drink alcoholic beverages -Take any medication unless instructed by your physician -Make any legal decisions or sign important papers.  Meals: Start with liquid foods such as gelatin or soup. Progress to regular foods as tolerated. Avoid greasy, spicy, heavy foods. If nausea and/or vomiting occur, drink only clear liquids until the nausea and/or vomiting subsides. Call your physician if vomiting continues.  Special Instructions/Symptoms: Your throat may feel dry or sore from the anesthesia or the breathing tube placed in your throat during surgery. If this causes discomfort, gargle with warm salt water. The discomfort should disappear within 24 hours.  If you had a scopolamine patch placed behind your ear for the management of post- operative nausea and/or vomiting:  1. The medication in the patch is effective for 72 hours, after which it should be removed.  Wrap patch in a tissue and discard in the trash. Wash hands thoroughly with soap and water. 2. You may remove the patch earlier than 72 hours if you experience unpleasant side effects which may include dry mouth, dizziness or visual disturbances. 3. Avoid touching the patch. Wash your hands with soap and water after contact with the patch.

## 2019-04-13 ENCOUNTER — Encounter (HOSPITAL_BASED_OUTPATIENT_CLINIC_OR_DEPARTMENT_OTHER): Payer: Self-pay | Admitting: Plastic Surgery

## 2019-04-17 NOTE — Addendum Note (Signed)
Encounter addended by: Kyung Rudd, MD on: 04/17/2019 2:59 PM  Actions taken: Clinical Note Signed

## 2019-04-17 NOTE — Addendum Note (Signed)
Encounter addended by: Kyung Rudd, MD on: 04/17/2019 2:56 PM  Actions taken: Clinical Note Signed

## 2019-04-19 ENCOUNTER — Telehealth: Payer: Self-pay

## 2019-04-19 NOTE — Progress Notes (Signed)
   Subjective:     Patient ID: [name], female    DOB: 1970/10/06, 48 y.o.   MRN: [id number]  Chief Complaint  Patient presents with  . Post-op Follow-up    for removal of (L) breast expander and placement of implant    HPI: The patient is a 48 y.o. female here for follow-up after exchange of tissue expander for implant, capsulotomies for implant repositioning, removal of Port-A-Cath on 04/12/2019.  1 week postop. She is here with her husband.  225 cc smooth round high profile gel implant was placed on the left. She is doing really well. Pain has been minimal. Incision is healing well. No erythema, drainage. No sign of seroma, hematoma, infection.  No concerns.  Review of Systems  Constitutional: Negative for chills, fever, malaise/fatigue and weight loss.  Respiratory: Negative.   Cardiovascular: Negative.   Genitourinary: Negative.   Musculoskeletal: Negative.   Skin: Negative for itching and rash.  Neurological: Negative.      Objective:   Vital Signs BP 101/67 (BP Location: Right Arm, Patient Position: Sitting, Cuff Size: Normal)   Pulse 79   Temp 97.8 F (36.6 C) (Temporal)   Ht 5\' 6"  (1.676 m)   Wt 150 lb 12.8 oz (68.4 kg)   SpO2 100%   BMI 24.34 kg/m  Vital Signs and Nursing Note Reviewed Chaperone present Physical Exam  Constitutional: She is oriented to person, place, and time and well-developed, well-nourished, and in no distress.  HENT:  Head: Normocephalic and atraumatic.  Cardiovascular: Normal rate.  Pulmonary/Chest: Effort normal.    Musculoskeletal: Normal range of motion.  Neurological: She is alert and oriented to person, place, and time. Gait normal.  Skin: Skin is warm and dry. No rash noted. She is not diaphoretic. No erythema. No pallor.  Psychiatric: Mood and affect normal.      Assessment/Plan:     ICD-10-CM   1. Port-A-Cath in place  Z95.828   2. Acquired absence of left breast  Z90.12   3. Malignant neoplasm of  upper-outer quadrant of left breast in female, estrogen receptor positive ([place])  C50.412    Z17.0    [name] is doing really well. No sign of seroma, hematoma, infection.  Continue to wear sports bra or breast binder Daily multivitamin, vitamin for optimal healing High-protein, low-carb/sugar diet Avoid bra with underwire at this time.   Call with any questions or concerns. She has a follow up in 1 week  Charlies Constable, PA-C 04/20/2019, 10:33 AM

## 2019-04-19 NOTE — Telephone Encounter (Signed)

## 2019-04-20 ENCOUNTER — Ambulatory Visit (INDEPENDENT_AMBULATORY_CARE_PROVIDER_SITE_OTHER): Payer: 59 | Admitting: Surgical

## 2019-04-20 ENCOUNTER — Encounter: Payer: Self-pay | Admitting: Surgical

## 2019-04-20 ENCOUNTER — Other Ambulatory Visit: Payer: Self-pay

## 2019-04-20 VITALS — BP 101/67 | HR 79 | Temp 97.8°F | Ht 66.0 in | Wt 150.8 lb

## 2019-04-20 DIAGNOSIS — C50412 Malignant neoplasm of upper-outer quadrant of left female breast: Secondary | ICD-10-CM

## 2019-04-20 DIAGNOSIS — Z170 Estrogen receptor positive status [ER+]: Secondary | ICD-10-CM

## 2019-04-20 DIAGNOSIS — Z95828 Presence of other vascular implants and grafts: Secondary | ICD-10-CM

## 2019-04-20 DIAGNOSIS — Z9012 Acquired absence of left breast and nipple: Secondary | ICD-10-CM

## 2019-04-23 ENCOUNTER — Encounter: Payer: Self-pay | Admitting: Oncology

## 2019-04-26 ENCOUNTER — Telehealth: Payer: Self-pay | Admitting: Plastic Surgery

## 2019-04-26 NOTE — Telephone Encounter (Signed)

## 2019-04-27 ENCOUNTER — Other Ambulatory Visit: Payer: Self-pay

## 2019-04-27 ENCOUNTER — Ambulatory Visit (INDEPENDENT_AMBULATORY_CARE_PROVIDER_SITE_OTHER): Payer: 59 | Admitting: Plastic Surgery

## 2019-04-27 ENCOUNTER — Encounter: Payer: Self-pay | Admitting: Plastic Surgery

## 2019-04-27 DIAGNOSIS — Z9889 Other specified postprocedural states: Secondary | ICD-10-CM

## 2019-04-30 ENCOUNTER — Encounter: Payer: Self-pay | Admitting: *Deleted

## 2019-04-30 ENCOUNTER — Other Ambulatory Visit: Payer: Self-pay | Admitting: *Deleted

## 2019-04-30 DIAGNOSIS — C50412 Malignant neoplasm of upper-outer quadrant of left female breast: Secondary | ICD-10-CM

## 2019-05-01 ENCOUNTER — Telehealth: Payer: Self-pay | Admitting: *Deleted

## 2019-05-01 ENCOUNTER — Telehealth: Payer: Self-pay | Admitting: Oncology

## 2019-05-01 NOTE — Telephone Encounter (Signed)
Left vm with appt with Dr. Lisbeth Renshaw and for [facility]. Contact information provided for questions or needs.

## 2019-05-01 NOTE — Telephone Encounter (Signed)
Returned patient's phone call regarding rescheduling an appointment, per patient's request 10/20 has moved to 10/21.

## 2019-05-02 ENCOUNTER — Encounter: Payer: Self-pay | Admitting: Oncology

## 2019-05-03 ENCOUNTER — Encounter: Payer: Self-pay | Admitting: *Deleted

## 2019-05-03 ENCOUNTER — Encounter: Payer: Self-pay | Admitting: Plastic Surgery

## 2019-05-03 DIAGNOSIS — Z9889 Other specified postprocedural states: Secondary | ICD-10-CM | POA: Insufficient documentation

## 2019-05-03 NOTE — Progress Notes (Signed)
   Subjective:    Patient ID: [name], female    DOB: 11/18/1970, 48 y.o.   MRN: [id number]  The patient is a 48 yrs old wf here with her husband for follow up after second stage breast reconstruction.  The implant was placed.  The incisions area healing well.  There is no sign of infection.  The patient is very pleased.  No redness.     Review of Systems  Constitutional: Negative.   Eyes: Negative.   Respiratory: Negative.   Cardiovascular: Negative.   Gastrointestinal: Negative.   Genitourinary: Negative.   Musculoskeletal: Negative.        Objective:   Physical Exam Vitals signs and nursing note reviewed.  Constitutional:      Appearance: Normal appearance.  Neck:     Musculoskeletal: Normal range of motion.  Cardiovascular:     Pulses: Normal pulses.  Pulmonary:     Effort: Pulmonary effort is normal.  Neurological:     General: No focal deficit present.     Mental Status: She is alert and oriented to person, place, and time.  Psychiatric:        Mood and Affect: Mood normal.        Behavior: Behavior normal.        Assessment & Plan:     ICD-10-CM   1. S/P breast reconstruction  Z98.890     Continue sports bra.  PT exercises at home for ROM.  Radiation likely to start in next month.

## 2019-05-08 ENCOUNTER — Other Ambulatory Visit: Payer: 59

## 2019-05-09 ENCOUNTER — Other Ambulatory Visit: Payer: Self-pay

## 2019-05-09 ENCOUNTER — Inpatient Hospital Stay: Payer: 59 | Attending: Oncology

## 2019-05-09 ENCOUNTER — Ambulatory Visit
Admission: RE | Admit: 2019-05-09 | Discharge: 2019-05-09 | Disposition: A | Discharge status: Home / Self Care | Payer: 59 | Source: Ambulatory Visit | Attending: Radiation Oncology | Admitting: Radiation Oncology

## 2019-05-09 DIAGNOSIS — Z9012 Acquired absence of left breast and nipple: Secondary | ICD-10-CM | POA: Insufficient documentation

## 2019-05-09 DIAGNOSIS — C50412 Malignant neoplasm of upper-outer quadrant of left female breast: Secondary | ICD-10-CM | POA: Insufficient documentation

## 2019-05-09 DIAGNOSIS — Z3009 Encounter for other general counseling and advice on contraception: Secondary | ICD-10-CM

## 2019-05-09 DIAGNOSIS — Z170 Estrogen receptor positive status [ER+]: Secondary | ICD-10-CM | POA: Insufficient documentation

## 2019-05-09 DIAGNOSIS — R61 Generalized hyperhidrosis: Secondary | ICD-10-CM | POA: Diagnosis not present

## 2019-05-09 LAB — CMP (CANCER CENTER ONLY)
ALT: 21 U/L (ref 0–44)
AST: 20 U/L (ref 15–41)
Albumin: 4 g/dL (ref 3.5–5.0)
Alkaline Phosphatase: 61 U/L (ref 38–126)
Anion gap: 11 (ref 5–15)
BUN: 16 mg/dL (ref 6–20)
CO2: 25 mmol/L (ref 22–32)
Calcium: 9.3 mg/dL (ref 8.9–10.3)
Chloride: 107 mmol/L (ref 98–111)
Creatinine: 1.08 mg/dL — ABNORMAL HIGH (ref 0.44–1.00)
GFR, Est AFR Am: >60 mL/min (ref >60)
GFR, Estimated: >60 mL/min (ref >60)
Glucose, Bld: 98 mg/dL (ref 70–99)
Potassium: 4 mmol/L (ref 3.5–5.1)
Sodium: 143 mmol/L (ref 135–145)
Total Bilirubin: 0.3 mg/dL (ref 0.3–1.2)
Total Protein: 7 g/dL (ref 6.5–8.1)

## 2019-05-09 LAB — CBC WITH DIFFERENTIAL (CANCER CENTER ONLY)
Abs Immature Granulocytes: 0.01 10*3/uL (ref 0.00–0.07)
Basophils Absolute: 0 10*3/uL (ref 0.0–0.1)
Basophils Relative: 1 %
Eosinophils Absolute: 0.2 10*3/uL (ref 0.0–0.5)
Eosinophils Relative: 3 %
HCT: 42.8 % (ref 36.0–46.0)
Hemoglobin: 14.1 g/dL (ref 12.0–15.0)
Immature Granulocytes: 0 %
Lymphocytes Relative: 18 %
Lymphs Abs: 1.3 10*3/uL (ref 0.7–4.0)
MCH: 31.8 pg (ref 26.0–34.0)
MCHC: 32.9 g/dL (ref 30.0–36.0)
MCV: 96.6 fL (ref 80.0–100.0)
Monocytes Absolute: 0.5 10*3/uL (ref 0.1–1.0)
Monocytes Relative: 7 %
Neutro Abs: 5 10*3/uL (ref 1.7–7.7)
Neutrophils Relative %: 71 %
Platelet Count: 336 10*3/uL (ref 150–400)
RBC: 4.43 MIL/uL (ref 3.87–5.11)
RDW: 12.3 % (ref 11.5–15.5)
WBC Count: 7 10*3/uL (ref 4.0–10.5)
nRBC: 0 % (ref 0.0–0.2)

## 2019-05-09 NOTE — Progress Notes (Addendum)
Radiation Oncology         [phone] ________________________________  Name: [name]        MRN: [id number]  Date of Service: 05/09/2019 DOB: 11/27/1970  AJ:OINOMV, Annie Main, MD  Magrinat, Virgie Dad, MD     REFERRING PHYSICIAN: Magrinat, Virgie Dad, MD   DIAGNOSIS: The encounter diagnosis was Malignant neoplasm of upper-outer quadrant of left breast in female, estrogen receptor positive ([place]).   HISTORY OF PRESENT ILLNESS: [name] is a 48 y.o. female with a recent history of left breast cancer. The patient was originally seen following nipple retraction in the left breast and a palpable mass in the upper outer quadrant.  Diagnostic imaging was performed on 09/25/2018 and a 7 mm oval duct in the left breast was noted without any evidence of axillary adenopathy.  There was also a grouping of 4 mm of calcifications in the left breast.  She underwent a biopsy on 10/02/2018 and pathology revealed a lobular carcinoma at 3:00 with possible microinvasion and at 12:00 an invasive and in situ lobular carcinoma.  Her tumor was ER/PR positive and HER-2 negative.  Her Ki-67 was less than 1%.  She subsequently underwent left mastectomy with sentinel lymph node biopsy on 10/18/2018 revealing a 7.8 cm grade 2 invasive lobular carcinoma.  Her margins were less than 1 mm in the anterior and inferior sites.  She had a total of 9 lymph nodes removed, 5 of which had macro metastases greater than 2 mm.  The largest was 15 mm.  She also underwent immediate tissue expander placement with Dr. [name]. She proceeded with adjuvant chemotherapy between 11/22/2018 and 04/02/2019.  The last of the 12 plan Taxol treatments were discontinued due to accommodate for her surgery.  She underwent tissue expander exchange for high-profile gel implant on 04/12/2019.  She is seen today via my chart to discuss options of adjuvant radiotherapy. She is planning to follow up with Dr. Jana Hakim on 05/15/2019 to discuss  antiestrogen therapy.     PREVIOUS RADIATION THERAPY: No   PAST MEDICAL HISTORY:  Past Medical History:  Diagnosis Date   Cancer ([place]) 10/18/2018   left breast cancer       PAST SURGICAL HISTORY: Past Surgical History:  Procedure Laterality Date   BREAST RECONSTRUCTION WITH PLACEMENT OF TISSUE EXPANDER AND FLEX HD (ACELLULAR HYDRATED DERMIS) Left 10/18/2018   Procedure: IMMEDIATEVBREAST RECONSTRUCTION WITH PLACEMENT OF TISSUE EXPANDER AND FLEX HD (ACELLULAR HYDRATED DERMIS);  Surgeon: Wallace Going, DO;  Location: [facility];  Service: Plastics;  Laterality: Left;   MASTECTOMY     MASTECTOMY W/ SENTINEL NODE BIOPSY Left 10/18/2018   Procedure: LEFT MASTECTOMY WITH SENTINEL LYMPH NODE BIOPSY;  Surgeon: Jovita Kussmaul, MD;  Location: [facility];  Service: General;  Laterality: Left;   PORT-A-CATH REMOVAL Right 04/12/2019   Procedure: REMOVAL PORT-A-CATH;  Surgeon: Wallace Going, DO;  Location: [place];  Service: Plastics;  Laterality: Right;   PORTACATH PLACEMENT Right 11/02/2018   Procedure: INSERTION PORT-A-CATH WITH ULTRASOUND;  Surgeon: Jovita Kussmaul, MD;  Location: [facility];  Service: General;  Laterality: Right;   REMOVAL OF TISSUE EXPANDER AND PLACEMENT OF IMPLANT Left 04/12/2019   Procedure: REMOVAL OF TISSUE EXPANDER AND PLACEMENT OF IMPLANT;  Surgeon: Wallace Going, DO;  Location: [facility];  Service: Plastics;  Laterality: Left;  90 min, please     FAMILY HISTORY:  Family History  Problem Relation Age  of Onset   Breast cancer Neg Hx    Ovarian cancer Neg Hx      SOCIAL HISTORY:  reports that she has never smoked. She has never used smokeless tobacco. She reports current alcohol use of about 3.0 standard drinks of alcohol per week. She reports that she does not use drugs. 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 She has teenage children and lives in [place]. Her husband accompanies her on the call.    ALLERGIES: Septra [sulfamethoxazole-trimethoprim]   MEDICATIONS:  No current outpatient medications on file.   No current facility-administered medications for this encounter.      REVIEW OF SYSTEMS: On review of systems, the patient reports that she is doing well overall since chemo and her most recent surgery. She denies any chest pain, shortness of breath, cough, fevers, chills, night sweats, unintended weight changes. She denies any bowel or bladder disturbances, and denies abdominal pain, nausea or vomiting. She denies any new musculoskeletal or joint aches or pains. A complete review of systems is obtained and is otherwise negative.     PHYSICAL EXAM:  Vitals not obtained due to encounter type. In general this is a well appearing caucasian female in no acute distress. She's alert and oriented x4 and appropriate throughout the examination. Cardiopulmonary assessment is negative for acute distress and she exhibits normal effort. Bilateral breast exam is deferred.    ECOG = 0  0 - Asymptomatic (Fully active, able to carry on all predisease activities without restriction)  1 - Symptomatic but completely ambulatory (Restricted in physically strenuous activity but ambulatory and able to carry out work of a light or sedentary nature. For example, light housework, office work)  2 - Symptomatic, <50% in bed during the day (Ambulatory and capable of all self care but unable to carry out any work activities. Up and about more than 50% of waking hours)  3 - Symptomatic, >50% in bed, but not bedbound (Capable of only limited self-care, confined to bed or chair 50% or more of waking hours)  4 - Bedbound (Completely disabled. Cannot carry on any self-care. Totally confined to bed or chair)  5 - Death   Eustace Pen MM, Creech RH, Tormey DC, et al. [phone]). "Toxicity and response  criteria of the [facility]". [place] Oncol. 5 (6): 649-55    LABORATORY DATA:  Lab Results  Component Value Date   WBC 3.5 (L) 04/02/2019   HGB 12.4 04/02/2019   HCT 38.1 04/02/2019   MCV 98.4 04/02/2019   PLT 230 04/02/2019   Lab Results  Component Value Date   NA 141 04/02/2019   K 4.4 04/02/2019   CL 107 04/02/2019   CO2 24 04/02/2019   Lab Results  Component Value Date   ALT 49 (H) 04/02/2019   AST 27 04/02/2019   ALKPHOS 51 04/02/2019   BILITOT 0.3 04/02/2019      RADIOGRAPHY: No results found.     IMPRESSION/PLAN: 1. Stage IB, pT3N2aM0 grade 2 ER/PR positive invasive lobular carcinoma of the left breast. Dr. Lisbeth Renshaw discusses the pathology findings and reviews the nature of left breast disease. She has done well since surgery and chemotherapy. She is healing from her reconstruction but is ready to proceed with adjuvant radiotherapy. We discussed the risks, benefits, short, and long term effects of radiotherapy, and the patient is interested in proceeding. Dr. Lisbeth Renshaw discusses the delivery  and logistics of radiotherapy and anticipates a course of 6 1/2 weeks of radiotherapy to the left chest wall and regional nodes with deep inspiration breath hold technique. She will proceed with simulation later today and will sign written consent to proceed.   2. Contraceptive counseling. The patient has had an IUD and has been amenorrheic since placement in April 2020. We will forgo urine pregnancy testing prior to simulation and treatment.   This encounter was provided by telemedicine platform Mychart.  The patient has given verbal consent for this type of encounter and has been advised to only accept a meeting of this type in a secure network environment. The time spent during this encounter was 45 minutes. The attendants for this meeting include  Dr. Lisbeth Renshaw, Hayden Pedro  and Mal Amabile Milosevic. Her husband [name] also joined in on the call.   During the encounter,  Dr. Lisbeth Renshaw, and Hayden Pedro were located at [facility] Radiation Oncology Department.  Mairely Foxworth Nesbit and her husband Shaunette Gassner were located at home.    The above documentation reflects my direct findings during this shared patient visit. Please see the separate note by Dr. Lisbeth Renshaw on this date for the remainder of the patient's plan of care.    Carola Rhine, PAC

## 2019-05-10 ENCOUNTER — Encounter: Payer: Self-pay | Admitting: *Deleted

## 2019-05-10 LAB — FOLLICLE STIMULATING HORMONE: FSH: 91.2 m[IU]/mL

## 2019-05-10 NOTE — Progress Notes (Signed)
  Radiation Oncology         [phone] ________________________________  Name: [name] MRN: [id number]  Date: 05/09/2019  DOB: 12-23-1970  Optical Surface Tracking Plan:  Since intensity modulated radiotherapy (IMRT) and 3D conformal radiation treatment methods are predicated on accurate and precise positioning for treatment, intrafraction motion monitoring is medically necessary to ensure accurate and safe treatment delivery.  The ability to quantify intrafraction motion without excessive ionizing radiation dose can only be performed with optical surface tracking. Accordingly, surface imaging offers the opportunity to obtain 3D measurements of patient position throughout IMRT and 3D treatments without excessive radiation exposure.  I am ordering optical surface tracking for this patient's upcoming course of radiotherapy. ________________________________  Kyung Rudd, MD 05/10/2019 1:55 PM    Reference:   Particia Jasper, et al. Surface imaging-based analysis of intrafraction motion for breast radiotherapy patients.Journal of [place], n. 6, nov. 2014. ISSN [id number].   Available at: <http://www.jacmp.org/index.php/jacmp/article/view/4957>.

## 2019-05-10 NOTE — Progress Notes (Signed)
  Radiation Oncology         [phone] ________________________________  Name: [name] MRN: [id number]  Date: 05/09/2019  DOB: 1970/08/12  Diagnosis DIAGNOSIS:     ICD-10-CM   1. Malignant neoplasm of upper-outer quadrant of left breast in female, estrogen receptor positive ([place])  C50.412    Z17.0      SIMULATION AND TREATMENT PLANNING NOTE  The patient presented for simulation prior to beginning her course of radiation treatment for her diagnosis of left-sided breast cancer. The patient was placed in a supine position on a breast board. A customized vac-lock bag was also constructed and this complex treatment device will be used on a daily basis during her treatment. In this fashion, a CT scan was obtained through the chest area and an isocenter was placed near the chest wall at the upper aspect of the right chest. A breath-hold technique has also been evaluated to determine if this significantly improves the spatial relationship between the target region and the heart. Based on this analysis, a breath-hold technique has been ordered for the patient's treatment.  The patient will be planned to receive a course of radiation initially to a dose of 50.4 gray. This will consist of a 4 field technique targeting the left chest wall as well as the supraclavicular region. Therefore 2 customized medial and lateral tangent fields have been created targeting the chest wall, and also 2 additional customized fields have been designed to treat the supraclavicular region both with a left supraclavicular field and a left posterior axillary boost field. A forward planning/reduced field technique will also be evaluated to determine if this significantly improves the dose homogeneity of the overall plan. Therefore, additional customized blocks/fields may be necessary.  This initial treatment will be accomplished at 1.8 gray per fraction.   The initial plan will consist of a 3-D conformal  technique. The target volume/scar, heart and lungs have been contoured and dose volume histograms of each of these structures will be evaluated as part of the 3-D conformal treatment planning process.   It is anticipated that the patient will then receive a 10 gray boost to the surgical scar. This will be accomplished at 2 gray per fraction. The final anticipated total dose therefore will correspond to 60.4 gray.    Special treatment procedure was performed today due to the extra time and effort required by myself to plan and prepare this patient for deep inspiration breath hold technique.  I have determined cardiac sparing to be of benefit to this patient to prevent long term cardiac damage due to radiation of the heart.  Bellows were placed on the patient's abdomen. To facilitate cardiac sparing, the patient was coached by the radiation therapists on breath hold techniques and breathing practice was performed. Practice waveforms were obtained. The patient was then scanned while maintaining breath hold in the treatment position.  This image was then transferred over to the imaging specialist. The imaging specialist then created a fusion of the free breathing and breath hold scans using the chest wall as the stable structure. I personally reviewed the fusion in axial, coronal and sagittal image planes.  Excellent cardiac sparing was obtained.  I felt the patient is an appropriate candidate for breath hold and the patient will be treated as such.  The image fusion was then reviewed with the patient to reinforce the necessity of reproducible breath hold.     _______________________________   Jodelle Gross, MD, PhD

## 2019-05-14 LAB — ESTRADIOL, ULTRA SENS: Estradiol, Sensitive: 3.9 pg/mL

## 2019-05-14 NOTE — Progress Notes (Signed)
[facility]  [phone] [phone]    ID: Julie-Anne Torain Dungee DOB: 21-Feb-1971  MR#: [id number]  AYT#:[id number]  Patient Care Team: Orpah Melter, MD as PCP - General (Family Medicine) Mauro Kaufmann, RN as Oncology Nurse Navigator Rockwell Germany, RN as Oncology Nurse Navigator Jovita Kussmaul, MD as Consulting Physician (General Surgery) Havanna Groner, Virgie Dad, MD as Consulting Physician (Oncology) Kyung Rudd, MD as Consulting Physician (Radiation Oncology) Aloha Gell, MD as Consulting Physician (Obstetrics and Gynecology) [name], Loel Lofty, DO as Attending Physician (Plastic Surgery) Chauncey Cruel, MD OTHER MD:   CHIEF COMPLAINT: Estrogen receptor positive breast cancer  CURRENT TREATMENT: Starting adjuvant radiation therapy   INTERVAL HISTORY: Marvina returns today for follow-up of her estrogen receptor positive breast cancer.   She completed her neoadjuvant chemotherapy on 04/02/2019.   Since her last visit here, she underwent removal of her tissue expanders and the placement of her breast implant on 04/12/2019 with Dr. [name].   She met with Dr. Lisbeth Renshaw on 05/09/2019 and is currently scheduled to undergo radiation treatments from 05/18/2019 to 07/06/2019.  She has not had a menstrual period since June 2020.  Of note, she also underwent coronavirus testing on 04/09/2019, prior to her implant placement, which was negative.   REVIEW OF SYSTEMS: Lorenza tolerated her definitive implant placement without any complications.  She did not need to take any pain medication for that.  She has had no fever, dehiscence, or erythema.  She is very pleased with the results.  She is going to have a tattoo, not a nipple reconstruction.  She is back to work with appropriate precautions.  A detailed review of systems today was otherwise stable.   HISTORY OF CURRENT ILLNESS: From the original intake note:  "Laurene" presented with left  nipple retraction for 1 week with retroareolar mass in the upper outer quadrant. She underwent bilateral diagnostic mammography with CAD and left breast ultrason[facility] at [facility] on 09/25/2018 showing: 7 mm oval duct in the left breast; no significant abnormalities in the left axilla. She also underwent additional imaging with left breast digital diagnostic mammogram on 10/02/2018 showing: new 0.4 cm cluster of grouped heterogeneous calcifications in the left breast.   Accordingly on 10/02/2018 she proceeded to biopsy of the left breast area in question. The pathology [id number]) from this procedure showed: mammary carcinoma in situ at the 3 o'clock mass, with possible focal microinvasion; invasive and in situ mammary carcinoma at the 12 o'clock mass, immunostain for E-cadherin is negative in the tumor cells, consistent with a lobular phenotype. Prognostic indicators significant for: estrogen receptor, 90% positive, with moderate staining intensity and progesterone receptor, 100% positive, with strong staining intensity. Proliferation marker Ki67 at <1%. HER2 negative (1+).   The patient's subsequent history is as detailed above.    PAST MEDICAL HISTORY: Past Medical History:  Diagnosis Date   Cancer ([place]) 10/18/2018   left breast cancer    PAST SURGICAL HISTORY: Past Surgical History:  Procedure Laterality Date   BREAST RECONSTRUCTION WITH PLACEMENT OF TISSUE EXPANDER AND FLEX HD (ACELLULAR HYDRATED DERMIS) Left 10/18/2018   Procedure: IMMEDIATEVBREAST RECONSTRUCTION WITH PLACEMENT OF TISSUE EXPANDER AND FLEX HD (ACELLULAR HYDRATED DERMIS);  Surgeon: Wallace Going, DO;  Location: [facility];  Service: Plastics;  Laterality: Left;   MASTECTOMY     MASTECTOMY W/ SENTINEL NODE BIOPSY Left 10/18/2018   Procedure: LEFT MASTECTOMY WITH SENTINEL LYMPH NODE BIOPSY;  Surgeon: Jovita Kussmaul, MD;  Location: [facility];  Service: General;  Laterality: Left;   PORT-A-CATH  REMOVAL Right 04/12/2019   Procedure: REMOVAL PORT-A-CATH;  Surgeon: Wallace Going, DO;  Location: [facility];  Service: Plastics;  Laterality: Right;   PORTACATH PLACEMENT Right 11/02/2018   Procedure: INSERTION PORT-A-CATH WITH ULTRASOUND;  Surgeon: Jovita Kussmaul, MD;  Location: [facility];  Service: General;  Laterality: Right;   REMOVAL OF TISSUE EXPANDER AND PLACEMENT OF IMPLANT Left 04/12/2019   Procedure: REMOVAL OF TISSUE EXPANDER AND PLACEMENT OF IMPLANT;  Surgeon: Wallace Going, DO;  Location: [facility];  Service: Plastics;  Laterality: Left;  90 min, please    FAMILY HISTORY Family History  Problem Relation Age of Onset   Breast cancer Neg Hx    Ovarian cancer Neg Hx    As of March 2020, patient's father is living and healthy at age 70. Patient's mother is also living and healthy at age 64. The patient denies a family hx of breast or ovarian cancer. She has 1 sister.   GYNECOLOGIC HISTORY:  Menarche: 48 years old Age at first live birth: 48 years old [place] P 2 LMP June 2020 Contraceptive: Mirena IUD has been replaced by a ParaGard IUD as of March 2020 She used birth control pills between ages 74 to 73 w/o event HRT n/a  Hysterectomy? no BSO? 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  She has a PhD in Probation officer.  Husband [name] is an Art gallery manager. She lives at home with her husband and 3 children. She has two children, [name] age 54 and [name] age 24. Husband [name] has one daughter, Kimball Manske age 74, who lives with them.   ADVANCED DIRECTIVES: In the absence of any documentation to the contrary her husband [name] is her HCPOA.   HEALTH MAINTENANCE: Social History   Tobacco Use   Smoking status: Never Smoker   Smokeless tobacco: Never Used  Substance Use Topics   Alcohol use: Yes    Alcohol/week: 3.0 standard drinks    Types: 3  Standard drinks or equivalent per week    Comment: social   Drug use: Never     Colonoscopy: never done  PAP: 08/2017  Bone density: never done   Allergies  Allergen Reactions   Septra [Sulfamethoxazole-Trimethoprim]     No current outpatient medications on file.   No current facility-administered medications for this visit.     OBJECTIVE: Young white woman who appears stated age 58:   05/15/19 0844  BP: 99/68  Pulse: 86  Resp: 18  Temp: 97.8 F (36.6 C)  SpO2: 99%   Wt Readings from Last 3 Encounters:  05/15/19 155 lb 14.4 oz (70.7 kg)  04/20/19 150 lb 12.8 oz (68.4 kg)  04/12/19 151 lb 10.8 oz (68.8 kg)   Body mass index is 25.16 kg/m.    ECOG FS:1 - Symptomatic but completely ambulatory  "Frosty" hair is coming back fully. Sclerae unicteric, EOMs intact Wearing a mask No cervical or supraclavicular adenopathy Lungs no rales or rhonchi Heart regular rate and rhythm Abd soft, nontender, positive bowel sounds MSK no focal spinal tenderness, no upper extremity lymphedema Neuro: nonfocal, well oriented, appropriate affect Breasts: Right breast is unremarkable.  Left breast is status post mastectomy with implant reconstruction.  The cosmetic result is excellent.  Both axillae are benign.    LAB RESULTS:  CMP     Component Value Date/Time  NA 143 05/09/2019 1408   K 4.0 05/09/2019 1408   CL 107 05/09/2019 1408   CO2 25 05/09/2019 1408   GLUCOSE 98 05/09/2019 1408   BUN 16 05/09/2019 1408   CREATININE 1.08 (H) 05/09/2019 1408   CALCIUM 9.3 05/09/2019 1408   PROT 7.0 05/09/2019 1408   ALBUMIN 4.0 05/09/2019 1408   AST 20 05/09/2019 1408   ALT 21 05/09/2019 1408   ALKPHOS 61 05/09/2019 1408   BILITOT 0.3 05/09/2019 1408   GFRNONAA >60 05/09/2019 1408   GFRAA >60 05/09/2019 1408    No results found for: TOTALPROTELP, ALBUMINELP, A1GS, A2GS, BETS, BETA2SER, GAMS, MSPIKE, SPEI  No results found for: KPAFRELGTCHN, LAMBDASER, KAPLAMBRATIO  Lab  Results  Component Value Date   WBC 7.0 05/09/2019   NEUTROABS 5.0 05/09/2019   HGB 14.1 05/09/2019   HCT 42.8 05/09/2019   MCV 96.6 05/09/2019   PLT 336 05/09/2019    No results found for: LABCA2  No components found for: [id number]  No results for input(s): INR in the last 168 hours.  No results found for: LABCA2  No results found for: TWK462  No results found for: MMN817  No results found for: RNH657  No results found for: CA2729  No components found for: HGQUANT  No results found for: CEA1 / No results found for: CEA1   No results found for: AFPTUMOR  No results found for: CHROMOGRNA  No results found for: PSA1  No visits with results within 3 Day(s) from this visit.  Latest known visit with results is:  Appointment on 05/09/2019  Component Date Value Ref Range Status   WBC Count 05/09/2019 7.0  4.0 - 10.5 K/uL Final   RBC 05/09/2019 4.43  3.87 - 5.11 MIL/uL Final   Hemoglobin 05/09/2019 14.1  12.0 - 15.0 g/dL Final   HCT 05/09/2019 42.8  36.0 - 46.0 % Final   MCV 05/09/2019 96.6  80.0 - 100.0 fL Final   MCH 05/09/2019 31.8  26.0 - 34.0 pg Final   MCHC 05/09/2019 32.9  30.0 - 36.0 g/dL Final   RDW 05/09/2019 12.3  11.5 - 15.5 % Final   Platelet Count 05/09/2019 336  150 - 400 K/uL Final   nRBC 05/09/2019 0.0  0.0 - 0.2 % Final   Neutrophils Relative % 05/09/2019 71  % Final   Neutro Abs 05/09/2019 5.0  1.7 - 7.7 K/uL Final   Lymphocytes Relative 05/09/2019 18  % Final   Lymphs Abs 05/09/2019 1.3  0.7 - 4.0 K/uL Final   Monocytes Relative 05/09/2019 7  % Final   Monocytes Absolute 05/09/2019 0.5  0.1 - 1.0 K/uL Final   Eosinophils Relative 05/09/2019 3  % Final   Eosinophils Absolute 05/09/2019 0.2  0.0 - 0.5 K/uL Final   Basophils Relative 05/09/2019 1  % Final   Basophils Absolute 05/09/2019 0.0  0.0 - 0.1 K/uL Final   Immature Granulocytes 05/09/2019 0  % Final   Abs Immature Granulocytes 05/09/2019 0.01  0.00 - 0.07 K/uL Final     Performed at [facility], [place] [address], [place], [place] [address]   Sodium 05/09/2019 143  135 - 145 mmol/L Final   Potassium 05/09/2019 4.0  3.5 - 5.1 mmol/L Final   Chloride 05/09/2019 107  98 - 111 mmol/L Final   CO2 05/09/2019 25  22 - 32 mmol/L Final   Glucose, Bld 05/09/2019 98  70 - 99 mg/dL Final   BUN 05/09/2019 16  6 -  20 mg/dL Final   Creatinine 05/09/2019 1.08* 0.44 - 1.00 mg/dL Final   Calcium 05/09/2019 9.3  8.9 - 10.3 mg/dL Final   Total Protein 05/09/2019 7.0  6.5 - 8.1 g/dL Final   Albumin 05/09/2019 4.0  3.5 - 5.0 g/dL Final   AST 05/09/2019 20  15 - 41 U/L Final   ALT 05/09/2019 21  0 - 44 U/L Final   Alkaline Phosphatase 05/09/2019 61  38 - 126 U/L Final   Total Bilirubin 05/09/2019 0.3  0.3 - 1.2 mg/dL Final   GFR, Est Non Af Am 05/09/2019 >60  >60 mL/min Final   GFR, Est AFR Am 05/09/2019 >60  >60 mL/min Final   Anion gap 05/09/2019 11  5 - 15 Final   Performed at [facility], [place] [address] [place], [place] [address]   [facility] 05/09/2019 91.2  mIU/mL Final   Comment: (NOTE)                    Adult Female:                      Follicular phase      3.5 -  12.5                      Ovulation phase       4.7 -  21.5                      Luteal phase          1.7 -   7.7                      Postmenopausal       25.8 - 134.8 Performed At: [facility] [place], [place] [id number] Rush Farmer MD ML:[id number]    Estradiol, Sensitive 05/09/2019 3.9  pg/mL Final   Comment: (NOTE)           Female:            Follicular:                    30.0 - 100.0            Luteal:                        70.0 - 300.0            Postmenopausal:                      < 15.0 This test was developed and its performance characteristics determined by [facility]. It has not been cleared by the Food and Drug Administration. Methodology: Liquid chromatography tandem  mass spectrometry(LC/MS/MS) Performed At: [facility] [place], [place] [id number] Rush Farmer MD [phone]     (this displays the last labs from the last 3 days)  No results found for: TOTALPROTELP, ALBUMINELP, A1GS, A2GS, BETS, BETA2SER, GAMS, MSPIKE, SPEI (this displays SPEP labs)  No results found for: KPAFRELGTCHN, LAMBDASER, KAPLAMBRATIO (kappa/lambda light chains)  No results found for: HGBA, HGBA2QUANT, HGBFQUANT, HGBSQUAN (Hemoglobinopathy evaluation)   No results found for: LDH  No results found for: IRON, TIBC, IRONPCTSAT (Iron and TIBC)  No results found for: FERRITIN  Urinalysis    Component Value Date/Time   COLORURINE YELLOW 03/05/2019 0912   APPEARANCEUR CLEAR 03/05/2019 0912  LABSPEC 1.017 03/05/2019 0912   PHURINE 5.0 03/05/2019 0912   GLUCOSEU NEGATIVE 03/05/2019 0912   HGBUR NEGATIVE 03/05/2019 0912   BILIRUBINUR NEGATIVE 03/05/2019 0912   KETONESUR NEGATIVE 03/05/2019 0912   PROTEINUR NEGATIVE 03/05/2019 0912   UROBILINOGEN 0.2 01/27/2008 0235   NITRITE NEGATIVE 03/05/2019 0912   LEUKOCYTESUR NEGATIVE 03/05/2019 0912     STUDIES: No results found.   ELIGIBLE FOR AVAILABLE RESEARCH PROTOCOL: no   ASSESSMENT: 48 y.o. [facility] woman status post left breast upper outer quadrant biopsy 10/02/2018 for a clinical T2N0, stage IB invasive lobular breast cancer, grade not stated, estrogen and progesterone receptor positive, HER-2 not amplified, with an MIB-1 of less than 1%  (1) status post left mastectomy and sentinel lymph node sampling 10/18/2018 for a pT3 pN2, stage IIA invasive lobular breast cancer, grade 2, with close but negative margins  (a) 5 of 9 lymph nodes removed had micrometastatic deposits of tumor  (b) immediate expander placement  (c) definitive silicone implant placement 04/12/2019 with [employer] Smooth Round High Profile Gel 225cc. Ref #[phone].  Serial Number [id number]  (2) chemotherapy consisting  of doxorubicin and cyclophosphamide in dose dense fashion x4 started 11/22/2018, completed 01/02/2019, followed by weekly paclitaxel x12 starting 01/15/2019, completed 04/02/2019  (a) received 11 of 12 planned taxol cycles (last cycle d/c'd to accommodate surgery)  (3) adjuvant radiation to start 05/18/2019  (4) antiestrogens to start at the completion of local treatment  (a) consider goserelin/anastrozole   PLAN: Kyiah is recovering very nicely from her chemotherapy treatments and is eagerly anticipating radiation.  We did discuss the possible toxicities side effects and complications of radiation therapy.  We discussed her [place] and estradiol levels.  At this point she is clearly menopausal.  She understands this may be permanent or over the next year her numbers may change and she may be back into having periods.  She does have an IUD in place so we do not have to worry about pregnancy.  I offered her gabapentin for her night sweats but she thinks she can deal with it right now.  I let her know that we have a pelvic health program if she develops significant vaginal dryness problems.  She will finished radiation sometime in mid December.  She will return to see me mid January.  Before that visit she will have another [facility] and estradiol level  She knows to call for any other issue that may develop before then.  Dioselina Brumbaugh, Virgie Dad, MD  05/15/19 9:15 AM Medical Oncology and Hematology [facility] [place], [place] [address] Tel. [phone]    Fax. [phone]   I, Wilburn Mylar, am acting as scribe for Dr. Virgie Dad. Saeed Toren.  I, Lurline Del MD, have reviewed the above documentation for accuracy and completeness, and I agree with the above.

## 2019-05-15 ENCOUNTER — Inpatient Hospital Stay (HOSPITAL_BASED_OUTPATIENT_CLINIC_OR_DEPARTMENT_OTHER): Payer: 59 | Admitting: Oncology

## 2019-05-15 ENCOUNTER — Other Ambulatory Visit: Payer: Self-pay

## 2019-05-15 VITALS — BP 99/68 | HR 86 | Temp 97.8°F | Resp 18 | Ht 66.0 in | Wt 155.9 lb

## 2019-05-15 DIAGNOSIS — Z170 Estrogen receptor positive status [ER+]: Secondary | ICD-10-CM

## 2019-05-15 DIAGNOSIS — C50412 Malignant neoplasm of upper-outer quadrant of left female breast: Secondary | ICD-10-CM

## 2019-05-16 ENCOUNTER — Telehealth: Payer: Self-pay | Admitting: Oncology

## 2019-05-16 NOTE — Telephone Encounter (Signed)
I talk with patient regarding schedule  

## 2019-05-18 ENCOUNTER — Other Ambulatory Visit: Payer: Self-pay

## 2019-05-18 ENCOUNTER — Ambulatory Visit
Admission: RE | Admit: 2019-05-18 | Discharge: 2019-05-18 | Disposition: A | Discharge status: Home / Self Care | Payer: 59 | Source: Ambulatory Visit | Attending: Radiation Oncology | Admitting: Radiation Oncology

## 2019-05-18 DIAGNOSIS — C50412 Malignant neoplasm of upper-outer quadrant of left female breast: Secondary | ICD-10-CM | POA: Diagnosis not present

## 2019-05-21 ENCOUNTER — Other Ambulatory Visit: Payer: Self-pay

## 2019-05-21 ENCOUNTER — Ambulatory Visit
Admission: RE | Admit: 2019-05-21 | Discharge: 2019-05-21 | Disposition: A | Discharge status: Home / Self Care | Payer: 59 | Source: Ambulatory Visit | Attending: Radiation Oncology | Admitting: Radiation Oncology

## 2019-05-21 DIAGNOSIS — C50412 Malignant neoplasm of upper-outer quadrant of left female breast: Secondary | ICD-10-CM | POA: Insufficient documentation

## 2019-05-21 DIAGNOSIS — Z170 Estrogen receptor positive status [ER+]: Secondary | ICD-10-CM | POA: Diagnosis present

## 2019-05-22 ENCOUNTER — Ambulatory Visit
Admission: RE | Admit: 2019-05-22 | Discharge: 2019-05-22 | Disposition: A | Discharge status: Home / Self Care | Payer: 59 | Source: Ambulatory Visit | Attending: Radiation Oncology | Admitting: Radiation Oncology

## 2019-05-22 ENCOUNTER — Other Ambulatory Visit: Payer: Self-pay

## 2019-05-22 DIAGNOSIS — C50412 Malignant neoplasm of upper-outer quadrant of left female breast: Secondary | ICD-10-CM | POA: Diagnosis not present

## 2019-05-23 ENCOUNTER — Other Ambulatory Visit: Payer: Self-pay

## 2019-05-23 ENCOUNTER — Ambulatory Visit
Admission: RE | Admit: 2019-05-23 | Discharge: 2019-05-23 | Disposition: A | Discharge status: Home / Self Care | Payer: 59 | Source: Ambulatory Visit | Attending: Radiation Oncology | Admitting: Radiation Oncology

## 2019-05-23 DIAGNOSIS — C50412 Malignant neoplasm of upper-outer quadrant of left female breast: Secondary | ICD-10-CM

## 2019-05-23 DIAGNOSIS — Z170 Estrogen receptor positive status [ER+]: Secondary | ICD-10-CM

## 2019-05-23 MED ORDER — ALRA NON-METALLIC DEODORANT (RAD-ONC)
1.0000 "application " | Freq: Once | TOPICAL | Status: AC
  Administered 2019-05-23: 1 via TOPICAL

## 2019-05-23 MED ORDER — RADIAPLEXRX EX GEL
Freq: Once | CUTANEOUS | Status: AC
  Administered 2019-05-23: 12:00:00 via TOPICAL

## 2019-05-24 ENCOUNTER — Other Ambulatory Visit: Payer: Self-pay

## 2019-05-24 ENCOUNTER — Ambulatory Visit
Admission: RE | Admit: 2019-05-24 | Discharge: 2019-05-24 | Disposition: A | Discharge status: Home / Self Care | Payer: 59 | Source: Ambulatory Visit | Attending: Radiation Oncology | Admitting: Radiation Oncology

## 2019-05-24 DIAGNOSIS — C50412 Malignant neoplasm of upper-outer quadrant of left female breast: Secondary | ICD-10-CM | POA: Diagnosis not present

## 2019-05-25 ENCOUNTER — Ambulatory Visit
Admission: RE | Admit: 2019-05-25 | Discharge: 2019-05-25 | Disposition: A | Discharge status: Home / Self Care | Payer: 59 | Source: Ambulatory Visit | Attending: Radiation Oncology | Admitting: Radiation Oncology

## 2019-05-25 ENCOUNTER — Other Ambulatory Visit: Payer: Self-pay

## 2019-05-25 DIAGNOSIS — C50412 Malignant neoplasm of upper-outer quadrant of left female breast: Secondary | ICD-10-CM | POA: Diagnosis not present

## 2019-05-28 ENCOUNTER — Other Ambulatory Visit: Payer: Self-pay

## 2019-05-28 ENCOUNTER — Ambulatory Visit
Admission: RE | Admit: 2019-05-28 | Discharge: 2019-05-28 | Disposition: A | Discharge status: Home / Self Care | Payer: 59 | Source: Ambulatory Visit | Attending: Radiation Oncology | Admitting: Radiation Oncology

## 2019-05-28 DIAGNOSIS — C50412 Malignant neoplasm of upper-outer quadrant of left female breast: Secondary | ICD-10-CM | POA: Diagnosis not present

## 2019-05-29 ENCOUNTER — Other Ambulatory Visit: Payer: Self-pay

## 2019-05-29 ENCOUNTER — Ambulatory Visit
Admission: RE | Admit: 2019-05-29 | Discharge: 2019-05-29 | Disposition: A | Discharge status: Home / Self Care | Payer: 59 | Source: Ambulatory Visit | Attending: Radiation Oncology | Admitting: Radiation Oncology

## 2019-05-29 DIAGNOSIS — C50412 Malignant neoplasm of upper-outer quadrant of left female breast: Secondary | ICD-10-CM | POA: Diagnosis not present

## 2019-05-30 ENCOUNTER — Ambulatory Visit
Admission: RE | Admit: 2019-05-30 | Discharge: 2019-05-30 | Disposition: A | Discharge status: Home / Self Care | Payer: 59 | Source: Ambulatory Visit | Attending: Radiation Oncology | Admitting: Radiation Oncology

## 2019-05-30 ENCOUNTER — Other Ambulatory Visit: Payer: Self-pay

## 2019-05-30 DIAGNOSIS — C50412 Malignant neoplasm of upper-outer quadrant of left female breast: Secondary | ICD-10-CM | POA: Diagnosis not present

## 2019-05-31 ENCOUNTER — Other Ambulatory Visit: Payer: Self-pay

## 2019-05-31 ENCOUNTER — Ambulatory Visit
Admission: RE | Admit: 2019-05-31 | Discharge: 2019-05-31 | Disposition: A | Discharge status: Home / Self Care | Payer: 59 | Source: Ambulatory Visit | Attending: Radiation Oncology | Admitting: Radiation Oncology

## 2019-05-31 DIAGNOSIS — C50412 Malignant neoplasm of upper-outer quadrant of left female breast: Secondary | ICD-10-CM | POA: Diagnosis not present

## 2019-06-01 ENCOUNTER — Other Ambulatory Visit: Payer: Self-pay

## 2019-06-01 ENCOUNTER — Ambulatory Visit
Admission: RE | Admit: 2019-06-01 | Discharge: 2019-06-01 | Disposition: A | Discharge status: Home / Self Care | Payer: 59 | Source: Ambulatory Visit | Attending: Radiation Oncology | Admitting: Radiation Oncology

## 2019-06-01 DIAGNOSIS — C50412 Malignant neoplasm of upper-outer quadrant of left female breast: Secondary | ICD-10-CM | POA: Diagnosis not present

## 2019-06-04 ENCOUNTER — Other Ambulatory Visit: Payer: Self-pay

## 2019-06-04 ENCOUNTER — Ambulatory Visit
Admission: RE | Admit: 2019-06-04 | Discharge: 2019-06-04 | Disposition: A | Discharge status: Home / Self Care | Payer: 59 | Source: Ambulatory Visit | Attending: Radiation Oncology | Admitting: Radiation Oncology

## 2019-06-04 DIAGNOSIS — C50412 Malignant neoplasm of upper-outer quadrant of left female breast: Secondary | ICD-10-CM | POA: Diagnosis not present

## 2019-06-05 ENCOUNTER — Ambulatory Visit
Admission: RE | Admit: 2019-06-05 | Discharge: 2019-06-05 | Disposition: A | Discharge status: Home / Self Care | Payer: 59 | Source: Ambulatory Visit | Attending: Radiation Oncology | Admitting: Radiation Oncology

## 2019-06-05 ENCOUNTER — Other Ambulatory Visit: Payer: Self-pay

## 2019-06-05 DIAGNOSIS — C50412 Malignant neoplasm of upper-outer quadrant of left female breast: Secondary | ICD-10-CM | POA: Diagnosis not present

## 2019-06-06 ENCOUNTER — Ambulatory Visit
Admission: RE | Admit: 2019-06-06 | Discharge: 2019-06-06 | Disposition: A | Discharge status: Home / Self Care | Payer: 59 | Source: Ambulatory Visit | Attending: Radiation Oncology | Admitting: Radiation Oncology

## 2019-06-06 ENCOUNTER — Other Ambulatory Visit: Payer: Self-pay

## 2019-06-06 DIAGNOSIS — C50412 Malignant neoplasm of upper-outer quadrant of left female breast: Secondary | ICD-10-CM | POA: Diagnosis not present

## 2019-06-07 ENCOUNTER — Other Ambulatory Visit: Payer: Self-pay

## 2019-06-07 ENCOUNTER — Ambulatory Visit
Admission: RE | Admit: 2019-06-07 | Discharge: 2019-06-07 | Disposition: A | Discharge status: Home / Self Care | Payer: 59 | Source: Ambulatory Visit | Attending: Radiation Oncology | Admitting: Radiation Oncology

## 2019-06-07 DIAGNOSIS — C50412 Malignant neoplasm of upper-outer quadrant of left female breast: Secondary | ICD-10-CM | POA: Diagnosis not present

## 2019-06-08 ENCOUNTER — Ambulatory Visit
Admission: RE | Admit: 2019-06-08 | Discharge: 2019-06-08 | Disposition: A | Discharge status: Home / Self Care | Payer: 59 | Source: Ambulatory Visit | Attending: Radiation Oncology | Admitting: Radiation Oncology

## 2019-06-08 ENCOUNTER — Other Ambulatory Visit: Payer: Self-pay

## 2019-06-08 DIAGNOSIS — C50412 Malignant neoplasm of upper-outer quadrant of left female breast: Secondary | ICD-10-CM | POA: Diagnosis not present

## 2019-06-10 ENCOUNTER — Other Ambulatory Visit: Payer: Self-pay

## 2019-06-10 ENCOUNTER — Ambulatory Visit
Admission: RE | Admit: 2019-06-10 | Discharge: 2019-06-10 | Disposition: A | Discharge status: Home / Self Care | Payer: 59 | Source: Ambulatory Visit | Attending: Radiation Oncology | Admitting: Radiation Oncology

## 2019-06-10 DIAGNOSIS — C50412 Malignant neoplasm of upper-outer quadrant of left female breast: Secondary | ICD-10-CM | POA: Diagnosis not present

## 2019-06-11 ENCOUNTER — Ambulatory Visit
Admission: RE | Admit: 2019-06-11 | Discharge: 2019-06-11 | Disposition: A | Discharge status: Home / Self Care | Payer: 59 | Source: Ambulatory Visit | Attending: Radiation Oncology | Admitting: Radiation Oncology

## 2019-06-11 ENCOUNTER — Other Ambulatory Visit: Payer: Self-pay

## 2019-06-11 DIAGNOSIS — C50412 Malignant neoplasm of upper-outer quadrant of left female breast: Secondary | ICD-10-CM | POA: Diagnosis not present

## 2019-06-12 ENCOUNTER — Ambulatory Visit
Admission: RE | Admit: 2019-06-12 | Discharge: 2019-06-12 | Disposition: A | Discharge status: Home / Self Care | Payer: 59 | Source: Ambulatory Visit | Attending: Radiation Oncology | Admitting: Radiation Oncology

## 2019-06-12 ENCOUNTER — Other Ambulatory Visit: Payer: Self-pay

## 2019-06-12 DIAGNOSIS — C50412 Malignant neoplasm of upper-outer quadrant of left female breast: Secondary | ICD-10-CM | POA: Diagnosis not present

## 2019-06-13 ENCOUNTER — Ambulatory Visit
Admission: RE | Admit: 2019-06-13 | Discharge: 2019-06-13 | Disposition: A | Discharge status: Home / Self Care | Payer: 59 | Source: Ambulatory Visit | Attending: Radiation Oncology | Admitting: Radiation Oncology

## 2019-06-13 ENCOUNTER — Other Ambulatory Visit: Payer: Self-pay

## 2019-06-13 DIAGNOSIS — C50412 Malignant neoplasm of upper-outer quadrant of left female breast: Secondary | ICD-10-CM | POA: Diagnosis not present

## 2019-06-18 ENCOUNTER — Ambulatory Visit
Admission: RE | Admit: 2019-06-18 | Discharge: 2019-06-18 | Disposition: A | Discharge status: Home / Self Care | Payer: 59 | Source: Ambulatory Visit | Attending: Radiation Oncology | Admitting: Radiation Oncology

## 2019-06-18 ENCOUNTER — Ambulatory Visit: Payer: 59 | Admitting: Radiation Oncology

## 2019-06-18 ENCOUNTER — Other Ambulatory Visit: Payer: Self-pay

## 2019-06-18 DIAGNOSIS — C50412 Malignant neoplasm of upper-outer quadrant of left female breast: Secondary | ICD-10-CM | POA: Diagnosis not present

## 2019-06-19 ENCOUNTER — Ambulatory Visit
Admission: RE | Admit: 2019-06-19 | Discharge: 2019-06-19 | Disposition: A | Discharge status: Home / Self Care | Payer: 59 | Source: Ambulatory Visit | Attending: Radiation Oncology | Admitting: Radiation Oncology

## 2019-06-19 ENCOUNTER — Other Ambulatory Visit: Payer: Self-pay

## 2019-06-19 DIAGNOSIS — C50412 Malignant neoplasm of upper-outer quadrant of left female breast: Secondary | ICD-10-CM | POA: Insufficient documentation

## 2019-06-19 DIAGNOSIS — Z170 Estrogen receptor positive status [ER+]: Secondary | ICD-10-CM | POA: Insufficient documentation

## 2019-06-20 ENCOUNTER — Other Ambulatory Visit: Payer: Self-pay

## 2019-06-20 ENCOUNTER — Ambulatory Visit
Admission: RE | Admit: 2019-06-20 | Discharge: 2019-06-20 | Disposition: A | Discharge status: Home / Self Care | Payer: 59 | Source: Ambulatory Visit | Attending: Radiation Oncology | Admitting: Radiation Oncology

## 2019-06-20 DIAGNOSIS — C50412 Malignant neoplasm of upper-outer quadrant of left female breast: Secondary | ICD-10-CM | POA: Diagnosis not present

## 2019-06-21 ENCOUNTER — Ambulatory Visit
Admission: RE | Admit: 2019-06-21 | Discharge: 2019-06-21 | Disposition: A | Discharge status: Home / Self Care | Payer: 59 | Source: Ambulatory Visit | Attending: Radiation Oncology | Admitting: Radiation Oncology

## 2019-06-21 ENCOUNTER — Other Ambulatory Visit: Payer: Self-pay

## 2019-06-21 DIAGNOSIS — C50412 Malignant neoplasm of upper-outer quadrant of left female breast: Secondary | ICD-10-CM | POA: Diagnosis not present

## 2019-06-22 ENCOUNTER — Ambulatory Visit
Admission: RE | Admit: 2019-06-22 | Discharge: 2019-06-22 | Disposition: A | Discharge status: Home / Self Care | Payer: 59 | Source: Ambulatory Visit | Attending: Radiation Oncology | Admitting: Radiation Oncology

## 2019-06-22 ENCOUNTER — Other Ambulatory Visit: Payer: Self-pay

## 2019-06-22 DIAGNOSIS — C50412 Malignant neoplasm of upper-outer quadrant of left female breast: Secondary | ICD-10-CM | POA: Diagnosis not present

## 2019-06-25 ENCOUNTER — Ambulatory Visit
Admission: RE | Admit: 2019-06-25 | Discharge: 2019-06-25 | Disposition: A | Discharge status: Home / Self Care | Payer: 59 | Source: Ambulatory Visit | Attending: Radiation Oncology | Admitting: Radiation Oncology

## 2019-06-25 ENCOUNTER — Other Ambulatory Visit: Payer: Self-pay

## 2019-06-25 DIAGNOSIS — C50412 Malignant neoplasm of upper-outer quadrant of left female breast: Secondary | ICD-10-CM | POA: Diagnosis not present

## 2019-06-26 ENCOUNTER — Ambulatory Visit
Admission: RE | Admit: 2019-06-26 | Discharge: 2019-06-26 | Disposition: A | Discharge status: Home / Self Care | Payer: 59 | Source: Ambulatory Visit | Attending: Radiation Oncology | Admitting: Radiation Oncology

## 2019-06-26 ENCOUNTER — Other Ambulatory Visit: Payer: Self-pay

## 2019-06-26 DIAGNOSIS — C50412 Malignant neoplasm of upper-outer quadrant of left female breast: Secondary | ICD-10-CM | POA: Diagnosis not present

## 2019-06-27 ENCOUNTER — Ambulatory Visit
Admission: RE | Admit: 2019-06-27 | Discharge: 2019-06-27 | Disposition: A | Discharge status: Home / Self Care | Payer: 59 | Source: Ambulatory Visit | Attending: Radiation Oncology | Admitting: Radiation Oncology

## 2019-06-27 ENCOUNTER — Other Ambulatory Visit: Payer: Self-pay

## 2019-06-27 DIAGNOSIS — C50412 Malignant neoplasm of upper-outer quadrant of left female breast: Secondary | ICD-10-CM | POA: Diagnosis not present

## 2019-06-28 ENCOUNTER — Ambulatory Visit
Admission: RE | Admit: 2019-06-28 | Discharge: 2019-06-28 | Disposition: A | Discharge status: Home / Self Care | Payer: 59 | Source: Ambulatory Visit | Attending: Radiation Oncology | Admitting: Radiation Oncology

## 2019-06-28 ENCOUNTER — Encounter: Payer: Self-pay | Admitting: Radiation Oncology

## 2019-06-28 ENCOUNTER — Other Ambulatory Visit: Payer: Self-pay

## 2019-06-28 DIAGNOSIS — C50412 Malignant neoplasm of upper-outer quadrant of left female breast: Secondary | ICD-10-CM | POA: Diagnosis not present

## 2019-06-29 ENCOUNTER — Ambulatory Visit: Payer: 59

## 2019-06-29 ENCOUNTER — Other Ambulatory Visit: Payer: Self-pay

## 2019-06-29 ENCOUNTER — Ambulatory Visit
Admission: RE | Admit: 2019-06-29 | Discharge: 2019-06-29 | Disposition: A | Discharge status: Home / Self Care | Payer: 59 | Source: Ambulatory Visit | Attending: Radiation Oncology | Admitting: Radiation Oncology

## 2019-06-29 ENCOUNTER — Ambulatory Visit: Payer: 59 | Admitting: Radiation Oncology

## 2019-06-29 DIAGNOSIS — C50412 Malignant neoplasm of upper-outer quadrant of left female breast: Secondary | ICD-10-CM | POA: Diagnosis not present

## 2019-06-29 DIAGNOSIS — Z170 Estrogen receptor positive status [ER+]: Secondary | ICD-10-CM

## 2019-06-29 MED ORDER — SONAFINE EX EMUL
1.0000 "application " | Freq: Two times a day (BID) | CUTANEOUS | Status: DC
  Administered 2019-06-29: 1 via TOPICAL

## 2019-07-02 ENCOUNTER — Other Ambulatory Visit: Payer: Self-pay

## 2019-07-02 ENCOUNTER — Ambulatory Visit: Payer: 59

## 2019-07-02 ENCOUNTER — Ambulatory Visit
Admission: RE | Admit: 2019-07-02 | Discharge: 2019-07-02 | Disposition: A | Discharge status: Home / Self Care | Payer: 59 | Source: Ambulatory Visit | Attending: Radiation Oncology | Admitting: Radiation Oncology

## 2019-07-02 DIAGNOSIS — C50412 Malignant neoplasm of upper-outer quadrant of left female breast: Secondary | ICD-10-CM | POA: Diagnosis not present

## 2019-07-03 ENCOUNTER — Ambulatory Visit
Admission: RE | Admit: 2019-07-03 | Discharge: 2019-07-03 | Disposition: A | Discharge status: Home / Self Care | Payer: 59 | Source: Ambulatory Visit | Attending: Radiation Oncology | Admitting: Radiation Oncology

## 2019-07-03 ENCOUNTER — Other Ambulatory Visit: Payer: Self-pay

## 2019-07-03 DIAGNOSIS — C50412 Malignant neoplasm of upper-outer quadrant of left female breast: Secondary | ICD-10-CM | POA: Diagnosis not present

## 2019-07-04 ENCOUNTER — Ambulatory Visit
Admission: RE | Admit: 2019-07-04 | Discharge: 2019-07-04 | Disposition: A | Discharge status: Home / Self Care | Payer: 59 | Source: Ambulatory Visit | Attending: Radiation Oncology | Admitting: Radiation Oncology

## 2019-07-04 ENCOUNTER — Other Ambulatory Visit: Payer: Self-pay

## 2019-07-04 DIAGNOSIS — C50412 Malignant neoplasm of upper-outer quadrant of left female breast: Secondary | ICD-10-CM | POA: Diagnosis not present

## 2019-07-05 ENCOUNTER — Ambulatory Visit
Admission: RE | Admit: 2019-07-05 | Discharge: 2019-07-05 | Disposition: A | Discharge status: Home / Self Care | Payer: 59 | Source: Ambulatory Visit | Attending: Radiation Oncology | Admitting: Radiation Oncology

## 2019-07-05 ENCOUNTER — Ambulatory Visit: Payer: 59

## 2019-07-05 ENCOUNTER — Other Ambulatory Visit: Payer: Self-pay

## 2019-07-05 DIAGNOSIS — C50412 Malignant neoplasm of upper-outer quadrant of left female breast: Secondary | ICD-10-CM | POA: Diagnosis not present

## 2019-07-06 ENCOUNTER — Ambulatory Visit: Payer: 59

## 2019-07-06 ENCOUNTER — Encounter: Payer: Self-pay | Admitting: *Deleted

## 2019-07-06 ENCOUNTER — Telehealth: Payer: Self-pay | Admitting: Adult Health

## 2019-07-06 NOTE — Telephone Encounter (Signed)
Scheduled appt per 12/18 sch message  - pt to get an updated schedule next visit.   

## 2019-07-19 ENCOUNTER — Encounter: Payer: Self-pay | Admitting: *Deleted

## 2019-07-30 ENCOUNTER — Encounter: Payer: Self-pay | Admitting: Oncology

## 2019-08-01 ENCOUNTER — Telehealth: Payer: Self-pay | Admitting: Radiation Oncology

## 2019-08-01 ENCOUNTER — Other Ambulatory Visit: Payer: Self-pay

## 2019-08-01 ENCOUNTER — Inpatient Hospital Stay: Payer: 59 | Attending: Oncology

## 2019-08-01 DIAGNOSIS — Z923 Personal history of irradiation: Secondary | ICD-10-CM | POA: Diagnosis not present

## 2019-08-01 DIAGNOSIS — C50412 Malignant neoplasm of upper-outer quadrant of left female breast: Secondary | ICD-10-CM | POA: Diagnosis not present

## 2019-08-01 DIAGNOSIS — Z170 Estrogen receptor positive status [ER+]: Secondary | ICD-10-CM | POA: Diagnosis not present

## 2019-08-01 LAB — CBC WITH DIFFERENTIAL (CANCER CENTER ONLY)
Abs Immature Granulocytes: 0 10*3/uL (ref 0.00–0.07)
Basophils Absolute: 0 10*3/uL (ref 0.0–0.1)
Basophils Relative: 1 %
Eosinophils Absolute: 0.2 10*3/uL (ref 0.0–0.5)
Eosinophils Relative: 5 %
HCT: 43.6 % (ref 36.0–46.0)
Hemoglobin: 14.4 g/dL (ref 12.0–15.0)
Immature Granulocytes: 0 %
Lymphocytes Relative: 26 %
Lymphs Abs: 1 10*3/uL (ref 0.7–4.0)
MCH: 30.5 pg (ref 26.0–34.0)
MCHC: 33 g/dL (ref 30.0–36.0)
MCV: 92.4 fL (ref 80.0–100.0)
Monocytes Absolute: 0.5 10*3/uL (ref 0.1–1.0)
Monocytes Relative: 13 %
Neutro Abs: 2.1 10*3/uL (ref 1.7–7.7)
Neutrophils Relative %: 55 %
Platelet Count: 307 10*3/uL (ref 150–400)
RBC: 4.72 MIL/uL (ref 3.87–5.11)
RDW: 13.4 % (ref 11.5–15.5)
WBC Count: 3.8 10*3/uL — ABNORMAL LOW (ref 4.0–10.5)
nRBC: 0 % (ref 0.0–0.2)

## 2019-08-01 LAB — CMP (CANCER CENTER ONLY)
ALT: 29 U/L (ref 0–44)
AST: 22 U/L (ref 15–41)
Albumin: 4.1 g/dL (ref 3.5–5.0)
Alkaline Phosphatase: 74 U/L (ref 38–126)
Anion gap: 9 (ref 5–15)
BUN: 17 mg/dL (ref 6–20)
CO2: 27 mmol/L (ref 22–32)
Calcium: 9.3 mg/dL (ref 8.9–10.3)
Chloride: 105 mmol/L (ref 98–111)
Creatinine: 0.96 mg/dL (ref 0.44–1.00)
GFR, Est AFR Am: >60 mL/min (ref >60)
GFR, Estimated: >60 mL/min (ref >60)
Glucose, Bld: 95 mg/dL (ref 70–99)
Potassium: 4.6 mmol/L (ref 3.5–5.1)
Sodium: 141 mmol/L (ref 135–145)
Total Bilirubin: 0.3 mg/dL (ref 0.3–1.2)
Total Protein: 7.1 g/dL (ref 6.5–8.1)

## 2019-08-01 NOTE — Telephone Encounter (Signed)
  Radiation Oncology         [phone] ________________________________  Name: [name] MRN: [id number]  Date of Service: 08/01/2019  DOB: Jun 04, 1971  Post Treatment Telephone Note  Diagnosis:  Stage IB, pT3N2aM0 grade 2 ER/PR positive invasive lobular carcinoma of the left breast  Interval Since Last Radiation:  4 weeks   05/21/2019-07/05/2019: The left chest wall and regional nodes were treated to 50.4 Gy in 28 fractions followed by a 10 Gy boost in 5 fractions.  Narrative:  The patient was contacted today for routine follow-up. During treatment she did very well with radiotherapy and did not have significant desquamation. She reports she is doing well and her skin is healing up nicely. She reports some soreness in her neck and some very subtle swelling of her fingers of her left hand. She denies heaviness of the extremity but has been concerned about the risks of lymphedema.  Impression/Plan: 1. Stage IB, pT3N2aM0 grade 2 ER/PR positive invasive lobular carcinoma of the left breast. The patient has been doing well since completion of radiotherapy. We discussed that we would be happy to continue to follow her as needed, but she will also continue to follow up with Dr. Jana Hakim in medical oncology. She was counseled on skin care as well as measures to avoid sun exposure to this area.  2. Survivorship. We discussed the importance of survivorship evaluation and will be scheduled for this after she meets with Dr. Jana Hakim. 3. LUE edema. While the patient's symptoms sound mild, I did recommend she meet with PT to assess for lymphedema since she had radiotherapy to her regional nodes following surgical resection. She is in agreement and orders were placed for urgent referral.  4. Possible genetic predisposition to malignancy. The patient is a candidate for genetic testing given her young age and personal history. She was offered referral and is interested. A referral will be placed.       Carola Rhine, PAC

## 2019-08-02 ENCOUNTER — Other Ambulatory Visit: Payer: 59

## 2019-08-02 ENCOUNTER — Telehealth: Payer: Self-pay | Admitting: Licensed Clinical Social Worker

## 2019-08-02 LAB — FOLLICLE STIMULATING HORMONE: FSH: 93.5 m[IU]/mL

## 2019-08-02 NOTE — Telephone Encounter (Signed)
Scheduled per 1/13 sch msg. Called and spoke with pt, confirmed 1/18 appt

## 2019-08-03 ENCOUNTER — Other Ambulatory Visit: Payer: Self-pay

## 2019-08-03 ENCOUNTER — Ambulatory Visit: Payer: 59 | Attending: Radiation Oncology

## 2019-08-03 DIAGNOSIS — Z170 Estrogen receptor positive status [ER+]: Secondary | ICD-10-CM | POA: Diagnosis present

## 2019-08-03 DIAGNOSIS — Z9012 Acquired absence of left breast and nipple: Secondary | ICD-10-CM | POA: Insufficient documentation

## 2019-08-03 DIAGNOSIS — M25612 Stiffness of left shoulder, not elsewhere classified: Secondary | ICD-10-CM | POA: Insufficient documentation

## 2019-08-03 DIAGNOSIS — C50412 Malignant neoplasm of upper-outer quadrant of left female breast: Secondary | ICD-10-CM | POA: Diagnosis not present

## 2019-08-03 DIAGNOSIS — R293 Abnormal posture: Secondary | ICD-10-CM | POA: Insufficient documentation

## 2019-08-03 DIAGNOSIS — I972 Postmastectomy lymphedema syndrome: Secondary | ICD-10-CM | POA: Insufficient documentation

## 2019-08-03 NOTE — Therapy (Signed)
[place], [place], [address] Phone: [phone]   Fax:  [phone]  Physical Therapy Evaluation  Patient Details  Name: [name] MRN: [id number] Date of Birth: 1971-05-11 Referring Provider (PT): Shona Simpson PA   Encounter Date: 08/03/2019  PT End of Session - 08/03/19 1115    Visit Number  1    Number of Visits  13    Date for PT Re-Evaluation  09/21/19    PT Start Time  1010    PT Stop Time  1053    PT Time Calculation (min)  43 min    Activity Tolerance  Patient tolerated treatment well    Behavior During Therapy  [facility] for tasks assessed/performed       Past Medical History:  Diagnosis Date  . Cancer ([place]) 10/18/2018   left breast cancer    Past Surgical History:  Procedure Laterality Date  . BREAST RECONSTRUCTION WITH PLACEMENT OF TISSUE EXPANDER AND FLEX HD (ACELLULAR HYDRATED DERMIS) Left 10/18/2018   Procedure: IMMEDIATEVBREAST RECONSTRUCTION WITH PLACEMENT OF TISSUE EXPANDER AND FLEX HD (ACELLULAR HYDRATED DERMIS);  Surgeon: Wallace Going, DO;  Location: [facility];  Service: Plastics;  Laterality: Left;  Marland Kitchen MASTECTOMY    . MASTECTOMY W/ SENTINEL NODE BIOPSY Left 10/18/2018   Procedure: LEFT MASTECTOMY WITH SENTINEL LYMPH NODE BIOPSY;  Surgeon: Jovita Kussmaul, MD;  Location: [facility];  Service: General;  Laterality: Left;  . PORT-A-CATH REMOVAL Right 04/12/2019   Procedure: REMOVAL PORT-A-CATH;  Surgeon: Wallace Going, DO;  Location: [facility];  Service: Plastics;  Laterality: Right;  . PORTACATH PLACEMENT Right 11/02/2018   Procedure: INSERTION PORT-A-CATH WITH ULTRASOUND;  Surgeon: Jovita Kussmaul, MD;  Location: [facility];  Service: General;  Laterality: Right;  . REMOVAL OF TISSUE EXPANDER AND PLACEMENT OF IMPLANT Left 04/12/2019   Procedure: REMOVAL OF TISSUE EXPANDER AND PLACEMENT OF IMPLANT;   Surgeon: Wallace Going, DO;  Location: [facility];  Service: Plastics;  Laterality: Left;  90 min, please    There were no vitals filed for this visit.   Subjective Assessment - 08/03/19 1018    Subjective  Pt reports that she came to physical therapy to work on her L shoulder back in April. She has finished chemotherapy in september of 2020 along with reconstruction surgery to remove expanders. She then had radiation and finished in December of 2020. She currently started to notice swelling in her arm beginning on Thanksgiving of 2020 but then it got better. She reports that about 1 week ago she noticed swelling again in her LUE which has gone slightly down since then but today she has swelling in her L hand.    Pertinent History  left mastectomy with SLNB (5 of 9 positive) by Dr. Marlou Starks followed by immediate reconstruction with tissue expander by Dr. [name] on 10/18/2018.    Limitations  Lifting    Currently in Pain?  No/denies   occasionally 2/10 pain        [facility] PT Assessment - 08/03/19 0001      Assessment   Medical Diagnosis  L breast cancer     Referring Provider (PT)  Shona Simpson PA    Onset Date/Surgical Date  04/12/19    Hand Dominance  Right    Prior Therapy  yes for the shoulder      Precautions   Precaution Comments  lymphedema, cancer history  Restrictions   Weight Bearing Restrictions  No      Balance Screen   Has the patient fallen in the past 6 months  No    Has the patient had a decrease in activity level because of a fear of falling?   No    Is the patient reluctant to leave their home because of a fear of falling?   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     Leisure  read, hike, cook       Cognition   Overall Cognitive Status  Within Functional Limits for tasks assessed      ROM / Strength   AROM / PROM / Strength  AROM      AROM   Left Shoulder Flexion  123 Degrees    Left Shoulder ABduction  93 Degrees    Left Shoulder Internal Rotation  83 Degrees    Left Shoulder External Rotation  80 Degrees        LYMPHEDEMA/ONCOLOGY QUESTIONNAIRE - 08/03/19 1033      Type   Cancer Type  L breast      Surgeries   Mastectomy Date  10/18/18    Sentinel Lymph Node Biopsy Date  10/18/18    Number Lymph Nodes Removed  9      Treatment   Active Chemotherapy Treatment  No    Past Chemotherapy Treatment  Yes    Date  04/02/19    Active Radiation Treatment  No    Past Radiation Treatment  Yes    Date  07/05/19    Body Site  L breast and axilla      What other symptoms do you have   Are you Having Heaviness or Tightness  Yes    Are you having Pain  Yes    Are you having pitting edema  No    Is it Hard or Difficult finding clothes that fit  No    Do you have infections  No    Is there Decreased scar mobility  Yes      Lymphedema Stage   Stage  STAGE 1 SPONTANEOUSLY REVERSIBLE      Lymphedema Assessments   Lymphedema Assessments  Upper extremities      Left Upper Extremity Lymphedema   15 cm Proximal to Olecranon Process  29 cm    10 cm Proximal to Olecranon Process  28 cm    Olecranon Process  23.4 cm    15 cm Proximal to Ulnar Styloid Process  23.7 cm    10 cm Proximal to Ulnar Styloid Process  21.9 cm    Just Proximal to Ulnar Styloid Process  15 cm    Across Hand at [employer]  18.8 cm    At [place] of 2nd Digit  5.9 cm             Outpatient Rehab [facility] 08/03/[facility]  Lymphedema Life Impact Scale Total Score  13.24 %      Objective measurements completed on examination: See above findings.      [facility] Adult PT Treatment/Exercise - 08/03/19 0001      Exercises   Exercises  Other Exercises  Other  Exercises   Lympahtic facilitation exercises w/VC to avoid pain and demonstration for pt return demonstration, cervical flexion/extension, R/L side bending and R/L rotation. Shoulder flexion, Shoulder shrugs, scapular retraction, Hip extension, trunk side bending R/L, elbow flexion/extension, wrist flexion/extension, hand pumps above the level of the heart 5x each.              PT Education - 08/03/19 1045    Education Details  Access Code: [email or web address], pt was educated on the anatomy and physiology of the lymph system. Discussed complete decongestive therapy including skin care, compression, exercise and manual lymph drainage. Pt was educated on the stages of lymphede including stage 0 and stage 1/2. Pt was educated on prophylactic use of a sleeve and also use of a sleeve for a short period of time to help give the overtaxed lymphatic system rest.    Person(s) Educated  Patient    Methods  Explanation;Demonstration;Verbal cues;Handout    Comprehension  Verbalized understanding;Returned demonstration       PT Short Term Goals - 08/03/19 1119      PT SHORT TERM GOAL #1   Title  Pt will be independent with HEP and self MLD to promote autonomy of care within 3 weeks.    Baseline  pt is currently not working on and HEP or knows how to perform MLD    Time  3    Period  Weeks    Status  New    Target Date  08/31/19      PT SHORT TERM GOAL #2   Title  Patient to be properly fitted with compression garment to wear on daily basis.    Baseline  Pt currently does not have a compression sleeve.    Time  3    Period  Weeks    Status  New    Target Date  08/31/19        PT Long Term Goals - 08/03/19 1120      PT LONG TERM GOAL #1   Title  Pt will improve L shoulder flexion and abduction to 150 degrees within 6 weeks to demonstrate improve functional mobility    Baseline  L shoulder flexion 123, L shoulder abd 89    Time  6    Period  Weeks    Status  New    Target Date  09/21/19       PT LONG TERM GOAL #2   Title  Patient will have reduction of L brachium limb girth by 2 cm within 6 weeks to demonstrate decreased risk for swelling, infection and immobility.    Baseline  see measurements.    Time  6    Period  Weeks    Status  New    Target Date  09/21/19      PT LONG TERM GOAL #3   Title  Pt will report 50% improvement in pain/mobility within 6 weeks in order to demonstrate an improved quality of life.    Baseline  3/10 intermittent pain in the LUE.    Time  6    Period  Weeks    Status  New    Target Date  09/21/19             Plan - 08/03/19 1116    Clinical Impression Statement  Pt presents to physical therapy with reports of intermittent heaviness/aching in her LUE with visible edema. She demonstrates increased circumferential measurements from the last time she was at physical therapy  following her masectomy in april of 2020. She continues with limited ROM of the L shoulder compared to the R that has slightly improved in flexion from that last time her ROM was checked but continues the same in abduction. Pt will benefit from skilled physical therapy services 2x/week for 6 weeks in order to address the above mentioned limitations and decrease risk of immobility.    Examination-Activity Limitations  Lift;Reach Overhead    Examination-Participation Restrictions  Yard Work;Community Activity    Stability/Clinical Decision Making  Stable/Uncomplicated    Clinical Decision Making  Low    Rehab Potential  Excellent    PT Frequency  2x / week    PT Duration  6 weeks    PT Treatment/Interventions  ADLs/Self Care Home Management;Therapeutic exercise;Neuromuscular re-education;Patient/family education;Manual lymph drainage;Passive range of motion    PT Next Visit Plan  begin teaching self MLD, check for script    PT Home Exercise Plan  Access Code: [email or web address]    Consulted and Agree with Plan of Care  Patient       Patient will benefit from skilled therapeutic  intervention in order to improve the following deficits and impairments:  Decreased knowledge of use of DME, Decreased range of motion, Pain, Increased edema  Visit Diagnosis: Malignant neoplasm of upper-outer quadrant of left breast in female, estrogen receptor positive ([place]) - Plan: PT plan of care cert/re-cert  Stiffness of left shoulder, not elsewhere classified - Plan: PT plan of care cert/re-cert  Postmastectomy lymphedema - Plan: PT plan of care cert/re-cert     Problem List Patient Active Problem List   Diagnosis Date Noted  . S/P breast reconstruction 05/03/2019  . Port-A-Cath in place 11/22/2018  . 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 Malignant neoplasm of upper-outer quadrant of left breast in female, estrogen receptor positive ([place]) 10/10/2018    Ander Purpura, PT 08/03/2019, 11:27 AM  [place], [place], [address] Phone: [phone]   Fax:  [phone]  Name: [name] MRN: [id number] Date of Birth: 10-27-1970

## 2019-08-03 NOTE — Patient Instructions (Signed)
Access Code: [email or web address]  URL: [email or web address]  Date: 08/03/2019  Prepared by: Tomma Rakers   Exercises Standing Upper Cervical Flexion and Extension - 20 reps - 1 sets - 1x daily - 7x weekly Standing Cervical Sidebending AROM - 20 reps - 1 sets - 1x daily - 7x weekly Standing Cervical Rotation AROM - 20 reps - 1 sets - 1x daily - 7x weekly Standing Shoulder Flexion Full Range - 20 reps - 1 sets - 1x daily - 7x weekly Standing Shoulder Shrugs - 20 reps - 1 sets - 1x daily - 7x weekly Standing Scapular Retraction - 20 reps - 1 sets - 1x daily - 7x weekly Trunk Sidebending with Compression Garment - 20 reps - 1 sets - 1x daily - 7x weekly Standing Hip Extension with Counter Support - 20 reps - 1 sets - 1x daily - 7x weekly Seated Knee Flexion Extension AROM - 20 reps - 1 sets - 1x daily - 7x weekly Heel Toe Raises with Counter Support - 20 reps - 1 sets - 1x daily - 7x weekly Standing Upper Cervical Flexion and Extension - 20 reps - 1 sets - 1x daily - 7x weekly Standing Cervical Sidebending AROM - 20 reps - 1 sets - 1x daily - 7x weekly Standing Cervical Rotation AROM - 20 reps - 1 sets - 1x daily - 7x weekly Standing Shoulder Flexion Full Range - 20 reps - 1 sets - 1x daily - 7x weekly Standing Shoulder Shrugs - 20 reps - 1 sets - 1x daily - 7x weekly Standing Scapular Retraction - 20 reps - 1 sets - 1x daily - 7x weekly Trunk Sidebending with Compression Garment - 20 reps - 1 sets - 1x daily - 7x weekly Standing Hip Extension with Counter Support - 20 reps - 1 sets - 1x daily - 7x weekly Elbow AROM Flexion & Extension Supinated Forearm - 20 reps - 1 sets - 1x daily - 7x weekly Wrist AROM Flexion Extension - 20 reps - 1 sets - 1x daily - 7x weekly Hand Fist Pumps - 20 reps - 1 sets - 1x daily - 7x weekly

## 2019-08-06 ENCOUNTER — Encounter: Payer: Self-pay | Admitting: Licensed Clinical Social Worker

## 2019-08-06 ENCOUNTER — Inpatient Hospital Stay (HOSPITAL_BASED_OUTPATIENT_CLINIC_OR_DEPARTMENT_OTHER): Payer: 59 | Admitting: Licensed Clinical Social Worker

## 2019-08-06 ENCOUNTER — Telehealth: Payer: Self-pay

## 2019-08-06 DIAGNOSIS — C50412 Malignant neoplasm of upper-outer quadrant of left female breast: Secondary | ICD-10-CM

## 2019-08-06 DIAGNOSIS — Z808 Family history of malignant neoplasm of other organs or systems: Secondary | ICD-10-CM | POA: Diagnosis not present

## 2019-08-06 DIAGNOSIS — Z809 Family history of malignant neoplasm, unspecified: Secondary | ICD-10-CM | POA: Insufficient documentation

## 2019-08-06 LAB — ESTRADIOL, ULTRA SENS: Estradiol, Sensitive: 2.6 pg/mL

## 2019-08-06 NOTE — Progress Notes (Signed)
REFERRING PROVIDER: Hayden Pedro, PA-C [place],  [place] [id number]  PRIMARY PROVIDER:  Orpah Melter, MD  PRIMARY REASON FOR VISIT:  1. Family history of cancer    I connected with [name] at 1:00 PM EDT on 08/06/2019 by [place] video conference and verified that I am speaking with the correct person using two identifiers.    Patient location: home Provider location: [facility]   HISTORY OF PRESENT ILLNESS:   [name], a 49 y.o. female, was seen for a [facility] cancer genetics consultation at the request of Shona Simpson, PA-C due to a personal history of cancer.  [name] presents to clinic today to discuss the possibility of a hereditary predisposition to cancer, genetic testing, and to further clarify her future cancer risks, as well as potential cancer risks for family members.   In 2020, at the age of 56, [name] was diagnosed with [facility] of the left breast, ER/PR+, Her2-. The treatment plan included left mastectomy, chemotherapy and radiation. She sees Dr. Jana Hakim on 08/08/2019 to discuss antiestrogen therapy.    CANCER HISTORY:  Oncology History  Malignant neoplasm of upper-outer quadrant of left breast in female, estrogen receptor positive ([place])  10/10/2018 Initial Diagnosis   Malignant neoplasm of upper-outer quadrant of left breast in female, estrogen receptor positive ([place])   11/22/2018 -  Chemotherapy   The patient had DOXOrubicin (ADRIAMYCIN) chemo injection 104 mg, 60 mg/m2 = 104 mg, Intravenous,  Once, 4 of 4 cycles Administration: 104 mg (11/22/2018), 104 mg (12/05/2018), 104 mg (12/19/2018), 104 mg (01/02/2019) palonosetron (ALOXI) injection 0.25 mg, 0.25 mg, Intravenous,  Once, 4 of 4 cycles Administration: 0.25 mg (11/22/2018), 0.25 mg (12/05/2018), 0.25 mg (12/19/2018), 0.25 mg (01/02/2019) pegfilgrastim (NEULASTA ONPRO KIT) injection 6 mg, 6 mg, Subcutaneous, Once, 4 of 4 cycles Administration: 6 mg (11/22/2018), 6 mg  (12/05/2018), 6 mg (12/19/2018), 6 mg (01/02/2019) cyclophosphamide (CYTOXAN) 1,040 mg in sodium chloride 0.9 % 250 mL chemo infusion, 600 mg/m2 = 1,040 mg, Intravenous,  Once, 4 of 4 cycles Administration: 1,040 mg (11/22/2018), 1,040 mg (12/05/2018), 1,040 mg (12/19/2018), 1,040 mg (01/02/2019) PACLitaxel (TAXOL) 138 mg in sodium chloride 0.9 % 250 mL chemo infusion (</= 46m/m2), 80 mg/m2 = 138 mg, Intravenous,  Once, 11 of 11 cycles Administration: 138 mg (01/15/2019), 138 mg (01/22/2019), 138 mg (01/29/2019), 138 mg (02/05/2019), 138 mg (02/12/2019), 138 mg (02/19/2019), 138 mg (02/26/2019), 138 mg (03/05/2019), 138 mg (03/19/2019), 138 mg (03/27/2019), 138 mg (04/02/2019) fosaprepitant (EMEND) 150 mg, dexamethasone (DECADRON) 12 mg in sodium chloride 0.9 % 145 mL IVPB, , Intravenous,  Once, 4 of 4 cycles Administration:  (11/22/2018),  (12/05/2018),  (12/19/2018),  (01/02/2019)  for chemotherapy treatment.       RISK FACTORS:  Menarche was at age 49  First live birth at age [age]  OCP use for approximately 5 years.  Ovaries intact: yes.  Hysterectomy: no.  Menopausal status: premenopausal.  HRT use: 0 years. Colonoscopy:no, not examined.  Mammogram within the last year: Yes. Number of breast biopsies: 1. Up to date with pelvic exams:Yes Any excessive radiation exposure in the past: no  Past Medical History:  Diagnosis Date  . [place]   left breast cancer  . Family history of cancer     Past Surgical History:  Procedure Laterality Date  . BREAST RECONSTRUCTION WITH PLACEMENT OF TISSUE EXPANDER AND FLEX HD (ACELLULAR HYDRATED DERMIS) Left 10/18/2018   Procedure: IMMEDIATEVBREAST RECONSTRUCTION WITH PLACEMENT OF TISSUE EXPANDER AND  FLEX HD (ACELLULAR HYDRATED DERMIS);  Surgeon: Wallace Going, DO;  Location: [facility];  Service: Plastics;  Laterality: Left;  Marland Kitchen MASTECTOMY    . MASTECTOMY W/ SENTINEL NODE BIOPSY Left 10/18/2018   Procedure: LEFT MASTECTOMY WITH SENTINEL  LYMPH NODE BIOPSY;  Surgeon: Jovita Kussmaul, MD;  Location: [facility];  Service: General;  Laterality: Left;  . PORT-A-CATH REMOVAL Right 04/12/2019   Procedure: REMOVAL PORT-A-CATH;  Surgeon: Wallace Going, DO;  Location: [facility];  Service: Plastics;  Laterality: Right;  . PORTACATH PLACEMENT Right 11/02/2018   Procedure: INSERTION PORT-A-CATH WITH ULTRASOUND;  Surgeon: Jovita Kussmaul, MD;  Location: [facility];  Service: General;  Laterality: Right;  . REMOVAL OF TISSUE EXPANDER AND PLACEMENT OF IMPLANT Left 04/12/2019   Procedure: REMOVAL OF TISSUE EXPANDER AND PLACEMENT OF IMPLANT;  Surgeon: Wallace Going, DO;  Location: [facility];  Service: Plastics;  Laterality: Left;  90 min, please    Social History   Socioeconomic History  . Marital status: Married    Spouse name: Richard  . Number of children: Not on file  . Years of education: Not on file  . Highest education level: Not on file  Occupational History  . Not on file  Tobacco Use  . Smoking status: Never Smoker  . Smokeless tobacco: Never Used  Substance and Sexual Activity  . Alcohol use: Yes    Alcohol/week: 3.0 standard drinks    Types: 3 Standard drinks or equivalent per week    Comment: social  . Drug use: Never  . Sexual activity: Not on file  Other Topics Concern  . Not on file  Social History Narrative  . Not on file   Social Determinants of Health   Financial Resource Strain:   . Difficulty of Paying Living Expenses: Not on file  Food Insecurity:   . Worried About Charity fundraiser in the Last Year: Not on file  . Ran Out of Food in the Last Year: Not on file  Transportation Needs: No Transportation Needs  . Lack of Transportation (Medical): No  . Lack of Transportation (Non-Medical): No  Physical Activity:   . Days of Exercise per Week: Not on file  . Minutes of Exercise per Session: Not on file  Stress:   . Feeling of  Stress : Not on file  Social Connections:   . Frequency of Communication with Friends and Family: Not on file  . Frequency of Social Gatherings with Friends and Family: Not on file  . Attends Religious Services: Not on file  . Active Member of Clubs or Organizations: Not on file  . Attends Archivist Meetings: Not on file  . Marital Status: Not on file     FAMILY HISTORY:  We obtained a detailed, 4-generation family history.  Significant diagnoses are listed below: Family History  Problem Relation Age of Onset  . Skin cancer Father        on back  . Skin cancer Paternal Grandmother        on nose  . Breast cancer Neg Hx   . Ovarian cancer Neg Hx    [name] has 1 daughter, 19, and 1 son, 2. She has a sister, 73, no history of cancer. She has 3 nieces, no cancers.  [name] mother is living at 38, no cancer. Patient has 1 maternal aunt, no cancers. She has 1 maternal uncle, no cancers. She  has 5 maternal cousins, no cancers. [name] maternal grandparents both passed in their 27s. Her grandmother possibly had cancer.  [name] father has had skin cancer on his back, removed. Patient's father was an only child so she does not have aunts/uncles/cousins. Paternal grandmother passed at 1 and had history of skin cancer on her nose. Paternal grandfather passed at 11.  [name] is unaware of previous family history of genetic testing for hereditary cancer risks. Patient's maternal ancestors are of [place] descent, and paternal ancestors are of English/German descent. There is no reported Ashkenazi Jewish ancestry. There is no known consanguinity.  GENETIC COUNSELING ASSESSMENT: [name] is a 49 y.o. female with a personal history which is somewhat suggestive of a hereditary cancer syndrome and predisposition to cancer. We, therefore, discussed and recommended the following at today's visit.   DISCUSSION: We discussed that 5 - 10% of breast cancer is  hereditary, with most cases associated with BRCA1/BRCA2 mutations.  There are other genes that can be associated with hereditary breast cancer syndromes.  We discussed that testing is beneficial for several reasons including knowing how to follow individuals after completing their treatment, and understand if other family members could be at risk for cancer and allow them to undergo genetic testing.   We reviewed the characteristics, features and inheritance patterns of hereditary cancer syndromes. We also discussed genetic testing, including the appropriate family members to test, the process of testing, insurance coverage and turn-around-time for results. We discussed the implications of a negative, positive and/or variant of uncertain significant result. We recommended [name] pursue genetic testing for the [facility] Common Hereditary Cancers gene panel.   The Common Hereditary Cancers Panel offered by [facility] includes sequencing and/or deletion duplication testing of the following 48 genes: APC, ATM, AXIN2, BARD1, BMPR1A, BRCA1, BRCA2, BRIP1, CDH1, CDKN2A (p14ARF), CDKN2A (p16INK4a), CKD4, CHEK2, CTNNA1, DICER1, EPCAM (Deletion/duplication testing only), GREM1 (promoter region deletion/duplication testing only), KIT, MEN1, MLH1, MSH2, MSH3, MSH6, MUTYH, NBN, NF1, NHTL1, PALB2, PDGFRA, PMS2, POLD1, POLE, PTEN, RAD50, RAD51C, RAD51D, RNF43, SDHB, SDHC, SDHD, SMAD4, SMARCA4. STK11, TP53, TSC1, TSC2, and VHL.  The following genes were evaluated for sequence changes only: SDHA and HOXB13 c.251G>A variant only.  Based on [name]'s personal history of cancer, she does not medical criteria for genetic testing. She will pay the patient pay price of $250 for this test.  PLAN: After considering the risks, benefits, and limitations, [name] provided informed consent to pursue genetic testing and the blood sample was sent to [facility] for analysis of the Common Hereditary Cancers Panel.  Results should be available within approximately 2-3 weeks' time, at which point they will be disclosed by telephone to [name], as will any additional recommendations warranted by these results. [name] will receive a summary of her genetic counseling visit and a copy of her results once available. This information will also be available in Epic.   Ms. Deshmukh questions were answered to her satisfaction today. Our contact information was provided should additional questions or concerns arise. Thank you for the referral and allowing [place] to share in the care of your patient.   Faith Rogue, MS, [facility] Genetic Counselor [email or web address] Phone: [phone]  The patient was seen for a total of 30 minutes in face-to-face genetic counseling.  Drs. Magrinat, Lindi Adie and/or Burr Medico were available for discussion regarding this case.   _______________________________________________________________________ For Office Staff:  Number of people involved in session: 1 Was an Intern/ student involved with case: no

## 2019-08-06 NOTE — Telephone Encounter (Signed)

## 2019-08-07 ENCOUNTER — Ambulatory Visit (INDEPENDENT_AMBULATORY_CARE_PROVIDER_SITE_OTHER): Payer: 59 | Admitting: Plastic Surgery

## 2019-08-07 ENCOUNTER — Ambulatory Visit: Payer: 59

## 2019-08-07 ENCOUNTER — Encounter: Payer: Self-pay | Admitting: Plastic Surgery

## 2019-08-07 ENCOUNTER — Ambulatory Visit: Payer: 59 | Admitting: Physical Therapy

## 2019-08-07 ENCOUNTER — Other Ambulatory Visit: Payer: Self-pay

## 2019-08-07 ENCOUNTER — Other Ambulatory Visit: Payer: Self-pay | Admitting: Licensed Clinical Social Worker

## 2019-08-07 VITALS — BP 119/69 | HR 82 | Temp 97.5°F | Ht 66.0 in | Wt 157.4 lb

## 2019-08-07 DIAGNOSIS — I972 Postmastectomy lymphedema syndrome: Secondary | ICD-10-CM

## 2019-08-07 DIAGNOSIS — Z9889 Other specified postprocedural states: Secondary | ICD-10-CM

## 2019-08-07 DIAGNOSIS — M25612 Stiffness of left shoulder, not elsewhere classified: Secondary | ICD-10-CM

## 2019-08-07 DIAGNOSIS — Z9012 Acquired absence of left breast and nipple: Secondary | ICD-10-CM | POA: Diagnosis not present

## 2019-08-07 DIAGNOSIS — C50412 Malignant neoplasm of upper-outer quadrant of left female breast: Secondary | ICD-10-CM | POA: Diagnosis not present

## 2019-08-07 DIAGNOSIS — Z170 Estrogen receptor positive status [ER+]: Secondary | ICD-10-CM

## 2019-08-07 DIAGNOSIS — R293 Abnormal posture: Secondary | ICD-10-CM

## 2019-08-07 NOTE — Patient Instructions (Signed)
Deep Effective Breath   Standing, sitting, or laying down, place both hands on the belly. Take a deep breath IN, expanding the belly; then breath OUT, contracting the belly. Repeat __5__ times. Do __2-3__ sessions per day and before your self massage.  http://gt2.exer.us/866   Copyright  VHI. All rights reserved.  Axilla to Axilla - Sweep   On both sides make 5 circles in the armpit, then pump _5__ times from involved armpit across chest to uninvolved armpit, making a pathway. Do _1__ time per day.  Copyright  VHI. All rights reserved.  Axilla to Inguinal Nodes - Sweep   On involved side, make 5 circles at groin at panty line, then pump _5__ times from armpit along side of trunk to outer hip, making your other pathway. Do __1_ time per day.  Copyright  VHI. All rights reserved.  Arm Posterior: Elbow to Shoulder - Sweep   Pump _5__ times from back of elbow to top of shoulder. Then inner to outer upper arm _5_ times, then outer arm again _5_ times. Then back to the pathways _2-3_ times. Do _1__ time per day.  Copyright  VHI. All rights reserved.  ARM: Volar Wrist to Elbow - Sweep   Pump or stationary circles _5__ times from wrist to elbow making sure to do both sides of the forearm. Then retrace your steps to the outer arm, and the pathways _2-3_ times each. Do _1__ time per day.  Copyright  VHI. All rights reserved.  ARM: Dorsum of Hand to Shoulder - Sweep   Pump or stationary circles _5__ times on back of hand including knuckle spaces and individual fingers if needed working up towards the wrist, then retrace all your steps working back up the forearm, doing both sides; upper outer arm and back to your pathways _2-3_ times each. Then do 5 circles again at uninvolved armpit and involved groin where you started! Good job!! Do __1_ time per day.  Copyright  VHI. All rights reserved.

## 2019-08-07 NOTE — Progress Notes (Signed)
[name]  [phone] [phone]    ID: [name] DOB: [birth year]  MR#: [id number]  MVH#:[id number]  Patient Care Team: Orpah Melter, MD as PCP - General (Family Medicine) Mauro Kaufmann, RN as Oncology Nurse Navigator Rockwell Germany, RN as Oncology Nurse Navigator Jovita Kussmaul, MD as Consulting Physician (General Surgery) Magrinat, Virgie Dad, MD as Consulting Physician (Oncology) Kyung Rudd, MD as Consulting Physician (Radiation Oncology) Aloha Gell, MD as Consulting Physician (Obstetrics and Gynecology) [name], Loel Lofty, DO as Attending Physician (Plastic Surgery) Chauncey Cruel, MD OTHER MD:   CHIEF COMPLAINT: Estrogen receptor positive breast cancer  CURRENT TREATMENT: Tamoxifen   INTERVAL HISTORY: [name] returns today for follow-up of her estrogen receptor positive breast cancer.  Her husband [name] participated by speaker phone  She completed her adjuvant radiation therapy on 07/05/2019.  She did have significant fatigue from this and also some dry desquamation.  The skin is healing quickly she says.  She returns today to discuss antiestrogen therapy.  Since her last visit, she was referred for genetic testing by Shona Simpson, PA with radiation oncology. She underwent genetic counseling on 08/06/2019 and the test was drawn today.   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  I concur.  She should continue to work from home as long as that is feasible.  She is beginning to exercise a little.  She has had some swelling of the left upper extremity and received a compression sleeve which she is learning how to use.  She has had no unusual headaches visual changes cough phlegm production or pleurisy.  Unfortunately she is not going to be able to receive the vaccine it looks like for some time given the current  distribution schedule.  A detailed review of systems today was otherwise stable.   HISTORY OF CURRENT ILLNESS: From the original intake note:  "[name]" presented with left nipple retraction for 1 week with retroareolar mass in the upper outer quadrant. She underwent bilateral diagnostic mammography with CAD and left breast ultrasonography at [facility] on 09/25/2018 showing: 7 mm oval duct in the left breast; no significant abnormalities in the left axilla. She also underwent additional imaging with left breast digital diagnostic mammogram on 10/02/2018 showing: new 0.4 cm cluster of grouped heterogeneous calcifications in the left breast.   Accordingly on 10/02/2018 she proceeded to biopsy of the left breast area in question. The pathology [id number]) from this procedure showed: mammary carcinoma in situ at the 3 o'clock mass, with possible focal microinvasion; invasive and in situ mammary carcinoma at the 12 o'clock mass, immunostain for E-cadherin is negative in the tumor cells, consistent with a lobular phenotype. Prognostic indicators significant for: estrogen receptor, 90% positive, with moderate staining intensity and progesterone receptor, 100% positive, with strong staining intensity. Proliferation marker Ki67 at <1%. HER2 negative (1+).   The patient's subsequent history is as detailed above.    PAST MEDICAL HISTORY: Past Medical History:  Diagnosis Date  . Cancer ([place]) 10/18/2018   left breast cancer  . Family history of cancer     PAST SURGICAL HISTORY: Past Surgical History:  Procedure Laterality Date  . BREAST RECONSTRUCTION WITH PLACEMENT OF TISSUE EXPANDER AND FLEX HD (ACELLULAR HYDRATED DERMIS) Left 10/18/2018   Procedure: IMMEDIATEVBREAST RECONSTRUCTION WITH PLACEMENT OF TISSUE EXPANDER AND FLEX HD (ACELLULAR HYDRATED DERMIS);  Surgeon: Wallace Going, DO;  Location: [place];  Service: Clinical cytogeneticist;  Laterality: Left;  Marland Kitchen MASTECTOMY    . MASTECTOMY W/ SENTINEL  NODE BIOPSY Left 10/18/2018   Procedure: LEFT MASTECTOMY WITH SENTINEL LYMPH NODE BIOPSY;  Surgeon: Jovita Kussmaul, MD;  Location: [place];  Service: General;  Laterality: Left;  . PORT-A-CATH REMOVAL Right 04/12/2019   Procedure: REMOVAL PORT-A-CATH;  Surgeon: Wallace Going, DO;  Location: [place];  Service: Plastics;  Laterality: Right;  . PORTACATH PLACEMENT Right 11/02/2018   Procedure: INSERTION PORT-A-CATH WITH ULTRASOUND;  Surgeon: Jovita Kussmaul, MD;  Location: [place];  Service: General;  Laterality: Right;  . REMOVAL OF TISSUE EXPANDER AND PLACEMENT OF IMPLANT Left 04/12/2019   Procedure: REMOVAL OF TISSUE EXPANDER AND PLACEMENT OF IMPLANT;  Surgeon: Wallace Going, DO;  Location: [place];  Service: Plastics;  Laterality: Left;  90 min, please    FAMILY HISTORY Family History  Problem Relation Age of Onset  . Skin cancer Father        on back  . Skin cancer Paternal Grandmother        on nose  . Breast cancer Neg Hx   . Ovarian cancer Neg Hx    As of March 2020, patient's father is living and healthy at age 46. Patient's mother is also living and healthy at age 80. The patient denies a family hx of breast or ovarian cancer. She has 1 sister.   GYNECOLOGIC HISTORY:  Menarche: 49 years old Age at first live birth: 49 years old [name] 2 LMP June 2020 Contraceptive: Mirena IUD has been replaced by a ParaGard IUD as of March 2020 She used birth control pills between ages 27 to 10 w/o event HRT n/a  Hysterectomy? no BSO? 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  She has a PhD in Probation officer.  Husband [name] is an Art gallery manager. She lives at home with her husband and 3 children. She has two children, [name] age 18 and [name] age 30. Husband [name] has one daughter, [name] age 64, who lives with them.   ADVANCED DIRECTIVES: In the  absence of any documentation to the contrary her husband [name] is her HCPOA.   HEALTH MAINTENANCE: Social History   Tobacco Use  . Smoking status: Never Smoker  . Smokeless tobacco: Never Used  Substance Use Topics  . Alcohol use: Yes    Alcohol/week: 3.0 standard drinks    Types: 3 Standard drinks or equivalent per week    Comment: social  . Drug use: Never     Colonoscopy: never done  PAP: 08/2017  Bone density: never done   Allergies  Allergen Reactions  . Septra [Sulfamethoxazole-Trimethoprim]     Current Outpatient Medications  Medication Sig Dispense Refill  . estradiol (ESTRING) 2 MG vaginal ring Place 2 mg vaginally every 3 (three) months. follow package directions 1 each 12  . tamoxifen (NOLVADEX) 20 MG tablet Take 1 tablet (20 mg total) by mouth daily. 90 tablet 12   No current facility-administered medications for this visit.    OBJECTIVE: Young white woman who appears stated age 49:   08/08/19 0853  BP: (!) 119/55  Pulse: 90  Resp: 18  Temp: 97.6 F (36.4 C)  SpO2: 99%   Wt Readings from Last 3 Encounters:  08/08/19 156 lb 4.8 oz (70.9 kg)  08/07/19 157 lb 6.4 oz (71.4 kg)  05/15/19 155 lb 14.4 oz (  70.7 kg)   Body mass index is 25.23 kg/m.    ECOG FS:1 - Symptomatic but completely ambulatory  Sclerae unicteric, EOMs intact Wearing a mask No cervical or supraclavicular adenopathy Lungs no rales or rhonchi Heart regular rate and rhythm Abd soft, nontender, positive bowel sounds MSK no focal spinal tenderness, no upper extremity lymphedema Neuro: nonfocal, well oriented, appropriate affect Breasts: The right breast is benign.  The left breast is status post mastectomy with a smooth silicone implant in place.  The cosmetic result is very good.  There is no evidence of local recurrence.  Both axillae are benign.   LAB RESULTS:  CMP     Component Value Date/Time   NA 143 08/08/2019 0830   K 4.2 08/08/2019 0830   CL 107 08/08/2019 0830    CO2 25 08/08/2019 0830   GLUCOSE 119 (H) 08/08/2019 0830   BUN 16 08/08/2019 0830   CREATININE 0.91 08/08/2019 0830   CALCIUM 9.0 08/08/2019 0830   PROT 6.6 08/08/2019 0830   ALBUMIN 4.0 08/08/2019 0830   AST 19 08/08/2019 0830   ALT 21 08/08/2019 0830   ALKPHOS 69 08/08/2019 0830   BILITOT 0.4 08/08/2019 0830   GFRNONAA >60 08/08/2019 0830   GFRAA >60 08/08/2019 0830    No results found for: TOTALPROTELP, ALBUMINELP, A1GS, A2GS, BETS, BETA2SER, GAMS, MSPIKE, SPEI  No results found for: KPAFRELGTCHN, LAMBDASER, KAPLAMBRATIO  Lab Results  Component Value Date   WBC 3.9 (L) 08/08/2019   NEUTROABS 2.5 08/08/2019   HGB 13.8 08/08/2019   HCT 42.4 08/08/2019   MCV 94.2 08/08/2019   PLT 293 08/08/2019    No results found for: LABCA2  No components found for: [id number]  No results for input(s): INR in the last 168 hours.  No results found for: LABCA2  No results found for: HAL937  No results found for: TKW409  No results found for: BDZ329  No results found for: CA2729  No components found for: HGQUANT  No results found for: CEA1 / No results found for: CEA1   No results found for: AFPTUMOR  No results found for: CHROMOGRNA  No results found for: HGBA, HGBA2QUANT, HGBFQUANT, HGBSQUAN (Hemoglobinopathy evaluation)   No results found for: LDH  No results found for: IRON, TIBC, IRONPCTSAT (Iron and TIBC)  No results found for: FERRITIN  Urinalysis    Component Value Date/Time   COLORURINE YELLOW 03/05/2019 0912   APPEARANCEUR CLEAR 03/05/2019 0912   LABSPEC 1.017 03/05/2019 0912   PHURINE 5.0 03/05/2019 0912   GLUCOSEU NEGATIVE 03/05/2019 0912   HGBUR NEGATIVE 03/05/2019 0912   BILIRUBINUR NEGATIVE 03/05/2019 0912   KETONESUR NEGATIVE 03/05/2019 0912   PROTEINUR NEGATIVE 03/05/2019 0912   UROBILINOGEN 0.2 01/27/2008 0235   NITRITE NEGATIVE 03/05/2019 0912   LEUKOCYTESUR NEGATIVE 03/05/2019 0912     STUDIES: No results found.   ELIGIBLE FOR  AVAILABLE RESEARCH PROTOCOL: no   ASSESSMENT: 49 y.o. [facility] woman status post left breast upper outer quadrant biopsy 10/02/2018 for a clinical T2N0, stage IB invasive lobular breast cancer, grade not stated, estrogen and progesterone receptor positive, HER-2 not amplified, with an MIB-1 of less than 1%  (1) status post left mastectomy and sentinel lymph node sampling 10/18/2018 for a pT3 pN2, stage IIA invasive lobular breast cancer, grade 2, with close but negative margins  (a) 5 of 9 lymph nodes removed had micrometastatic deposits of tumor  (b) immediate expander placement  (c) definitive silicone implant placement 04/12/2019 with [employer] Smooth Round High  Profile Gel 225cc. Ref #[id number].  Serial Number [id number]  (2) chemotherapy consisting of doxorubicin and cyclophosphamide in dose dense fashion x4 started 11/22/2018, completed 01/02/2019, followed by weekly paclitaxel x12 starting 01/15/2019, completed 04/02/2019  (a) received 11 of 12 planned taxol cycles (last cycle d/c'd to accommodate surgery)  (3) adjuvant radiation 05/21/2019 - 07/05/2019  (a) left chest wall / 50.4 Gy in 28 fractions  (b) boost / 10 Gy in 5 fractions  (4) tamoxifen started 08/16/2019  (a) FSH/estradiol levels consistent with menopause 05/09/2019 and 08/01/2019   PLAN: [name] is now a little over half a year out from definitive surgery for her breast cancer with no evidence of disease recurrence.  This is favorable.  She has completed her adjuvant radiation and is now ready to consider antiestrogens.  We discussed the difference between tamoxifen and anastrozole in detail. She understands that anastrozole and the aromatase inhibitors in general work by blocking estrogen production. Accordingly vaginal dryness, decrease in bone density, and of course hot flashes can result. The aromatase inhibitors can also negatively affect the cholesterol profile, although that is a minor effect. One out of 5 women on  aromatase inhibitors we will feel "old and achy". This arthralgia/myalgia syndrome, which resembles fibromyalgia clinically, does resolve with stopping the medications. Accordingly this is not a reason to not try an aromatase inhibitor but it is a frequent reason to stop it (in other words 20% of women will not be able to tolerate these medications).  Tamoxifen on the other hand does not block estrogen production. It does not "take away a woman's estrogen". It blocks the estrogen receptor in breast cells. Like anastrozole, it can also cause hot flashes. As opposed to anastrozole, tamoxifen has many estrogen-like effects. It is technically an estrogen receptor modulator. This means that in some tissues tamoxifen works like estrogen-- for example it helps strengthen the bones. It tends to improve the cholesterol profile. It can cause thickening of the endometrial lining, and even endometrial polyps or rarely cancer of the uterus.(The risk of uterine cancer due to tamoxifen is one additional cancer per thousand women year). It can cause vaginal wetness or stickiness. It can cause blood clots through this estrogen-like effect--the risk of blood clots with tamoxifen is exactly the same as with birth control pills or hormone replacement.  Neither of these agents causes mood changes or weight gain, despite the popular belief that they can have these side effects. We have data from studies comparing either of these drugs with placebo, and in those cases the control group had the same amount of weight gain and depression as the group that took the drug.  Given her young age and the fact that she may still regain ovarian function, I think the best option for her is tamoxifen.  She is interested in vaginal estrogens and I have written her a concurrent prescription for Estring.  She is going to return to see me in about 2-1/2 months.  We will again repeat Kimberly and estradiol levels then and every 3 months to total 1 year.   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 [phone]    Fax. [phone]   I, Wilburn Mylar, am acting as scribe for Dr. Virgie Dad. Magrinat.  I, Lurline Del MD, have reviewed the above documentation for accuracy and completeness, and I agree with the above.   *Total Encounter Time as defined by the Centers for Medicare and Medicaid Services includes, in addition to the face-to-face time of a patient visit (documented in the note above) non-face-to-face time: obtaining and reviewing outside history, ordering and reviewing medications, tests or procedures, care coordination (communications with other health care professionals or caregivers) and documentation in the medical record.

## 2019-08-07 NOTE — Therapy (Signed)
[place], [place], [address] Phone: [phone]   Fax:  [phone]  Physical Therapy Treatment  Patient Details  Name: [name] MRN: [id number] Date of Birth: [birth year] Referring Provider (PT): Shona Simpson PA   Encounter Date: 08/07/2019  PT End of Session - 08/07/19 0958    Visit Number  2    Number of Visits  13    Date for PT Re-Evaluation  09/21/19    PT Start Time  1000    PT Stop Time  1057    PT Time Calculation (min)  57 min    Activity Tolerance  Patient tolerated treatment well    Behavior During Therapy  [facility] for tasks assessed/performed       Past Medical History:  Diagnosis Date  . Cancer ([place]) 10/18/2018   left breast cancer  . Family history of cancer     Past Surgical History:  Procedure Laterality Date  . BREAST RECONSTRUCTION WITH PLACEMENT OF TISSUE EXPANDER AND FLEX HD (ACELLULAR HYDRATED DERMIS) Left 10/18/2018   Procedure: IMMEDIATEVBREAST RECONSTRUCTION WITH PLACEMENT OF TISSUE EXPANDER AND FLEX HD (ACELLULAR HYDRATED DERMIS);  Surgeon: Wallace Going, DO;  Location: [place];  Service: Plastics;  Laterality: Left;  Marland Kitchen MASTECTOMY    . MASTECTOMY W/ SENTINEL NODE BIOPSY Left 10/18/2018   Procedure: LEFT MASTECTOMY WITH SENTINEL LYMPH NODE BIOPSY;  Surgeon: Jovita Kussmaul, MD;  Location: [place];  Service: General;  Laterality: Left;  . PORT-A-CATH REMOVAL Right 04/12/2019   Procedure: REMOVAL PORT-A-CATH;  Surgeon: Wallace Going, DO;  Location: [place];  Service: Plastics;  Laterality: Right;  . PORTACATH PLACEMENT Right 11/02/2018   Procedure: INSERTION PORT-A-CATH WITH ULTRASOUND;  Surgeon: Jovita Kussmaul, MD;  Location: [place];  Service: General;  Laterality: Right;  . REMOVAL OF TISSUE EXPANDER AND PLACEMENT OF IMPLANT Left 04/12/2019   Procedure: REMOVAL OF TISSUE EXPANDER  AND PLACEMENT OF IMPLANT;  Surgeon: Wallace Going, DO;  Location: [place];  Service: Plastics;  Laterality: Left;  90 min, please    There were no vitals filed for this visit.  Subjective Assessment - 08/07/19 0958    Subjective  Pt reports that she just has some achiness. She reports nothing new since her initial evaluation.    Pertinent History  left mastectomy with SLNB (5 of 9 positive) by Dr. Marlou Starks followed by immediate reconstruction with tissue expander by Dr. [name] on 10/18/2018.    Limitations  Lifting    Patient Stated Goals  I want to reduce the swellingin my arm and increase flexibility.    Currently in Pain?  No/denies                  [facility]patient [facility]  Lymphedema Life Impact Scale Total Score  13.24 %           [facility] Adult PT Treatment/Exercise - 08/07/19 0001      Manual Therapy   Manual Therapy  Soft tissue mobilization;Manual Lymphatic Drainage (MLD);Passive ROM    Soft tissue mobilization  LIght STm to the L upper trapezius, latissimus dorsi at insertion on the humerus, deltoid, levator scapula and pectoralis major with 1x Tp in pectoralis into abduction P/ROM; significant improvement following STM.     Manual Lymphatic Drainage (MLD)  self MLD education in supine: 5 diaphragmatic breathes, BIl axillary and L inguinal node stimulation.  Anterior inter-axillary anastomosis, L axillo-inguinal anastomosis, lateral L brachium, medial to lateral L brachium, lateral L brachium, re-worked anastomosis, all sufaces of the L antebrachium, dorsum of the L hand and re-worked all surfaces with VC, demonstration and tactile cueing throughout for correct technique. Following STM MLD was performed in supine: short neck, swimming in the terminus, Bil shoulders, Bil axillary and L inguinal nodes, anteiror inter axillary anastomosis, L axillo-inguinal anastomosis, lateral L brachium, medial  to lateral L brachium, lateral L brachium, all surfaces of the L antebrachium and dorsum of the L hand; re-worked all surfaces and deep abdominals.     Passive ROM  P/ROM into flexion/abduction and external rotation. no pain going into ER. Increased pain into flexion that decrease with STM on the lats and pain into abduction in the L pec that decreased w/1x Tp release.              PT Education - 08/07/19 1057    Education Details  Pt was educated on self MLD with an emphasis on direction, pressure and skin stretch in order to promote adequate lymphatic facilitation. Re-iterated education on the lymphatic system anatomy and physiology.    Person(s) Educated  Patient    Methods  Explanation;Demonstration;Tactile cues;Verbal cues;Handout    Comprehension  Verbalized understanding;Returned demonstration       PT Short Term Goals - 08/03/19 1119      PT SHORT TERM GOAL #1   Title  Pt will be independent with HEP and self MLD to promote autonomy of care within 3 weeks.    Baseline  pt is currently not working on and HEP or knows how to perform MLD    Time  3    Period  Weeks    Status  New    Target Date  08/31/19      PT SHORT TERM GOAL #2   Title  Patient to be properly fitted with compression garment to wear on daily basis.    Baseline  Pt currently does not have a compression sleeve.    Time  3    Period  Weeks    Status  New    Target Date  08/31/19        PT Long Term Goals - 08/03/19 1120      PT LONG TERM GOAL #1   Title  Pt will improve L shoulder flexion and abduction to 150 degrees within 6 weeks to demonstrate improve functional mobility    Baseline  L shoulder flexion 123, L shoulder abd 89    Time  6    Period  Weeks    Status  New    Target Date  09/21/19      PT LONG TERM GOAL #2   Title  Patient will have reduction of L brachium limb girth by 2 cm within 6 weeks to demonstrate decreased risk for swelling, infection and immobility.    Baseline  see  measurements.    Time  6    Period  Weeks    Status  New    Target Date  09/21/19      PT LONG TERM GOAL #3   Title  Pt will report 50% improvement in pain/mobility within 6 weeks in order to demonstrate an improved quality of life.    Baseline  3/10 intermittent pain in the LUE.    Time  6    Period  Weeks    Status  New    Target Date  09/21/19  Plan - 08/07/19 0958    Clinical Impression Statement  Pt presents to physical therapy wtih continued reports of pain/stiffness in her LUE. Palpable tightness/tenderness noted at the L pectoralis major, levator scap, upper trapezius, deltoid and latissimus dorsi at insertion on the humerus; decreased moderately following light STM. MLD was performed following STM by the therapist. Pt was educated on self MLD with hand over hand direction for pressure/skin stretch and VC for correct technique. Pt will benefit from continued POC at this time.    Examination-Activity Limitations  Lift;Reach Overhead    Examination-Participation Restrictions  Yard Work;Community Activity    Rehab Potential  Excellent    PT Frequency  2x / week    PT Duration  6 weeks    PT Treatment/Interventions  ADLs/Self Care Home Management;Therapeutic exercise;Neuromuscular re-education;Patient/family education;Manual lymph drainage;Passive range of motion    PT Next Visit Plan  assess compression sleeve and STM. See pt authomy with self MLD. progress L shoulder/scapular strengthening.    PT Home Exercise Plan  Access Code: [email or web address]    Consulted and Agree with Plan of Care  Patient       Patient will benefit from skilled therapeutic intervention in order to improve the following deficits and impairments:  Decreased knowledge of use of DME, Decreased range of motion, Pain, Increased edema  Visit Diagnosis: Malignant neoplasm of upper-outer quadrant of left breast in female, estrogen receptor positive (HCC)  Stiffness of left shoulder, not elsewhere  classified  Postmastectomy lymphedema  Abnormal posture     Problem List Patient Active Problem List   Diagnosis Date Noted  . Family history of cancer   . S/P breast reconstruction 05/03/2019  . Port-A-Cath in place 11/22/2018  . 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 Malignant neoplasm of upper-outer quadrant of left breast in female, estrogen receptor positive ([place]) 10/10/2018    Ander Purpura ,PT 08/07/2019, 11:06 AM  [place], [place], [address] Phone: [phone]   Fax:  [phone]  Name: [name] MRN: [id number] Date of Birth: May 21, 1971

## 2019-08-07 NOTE — Progress Notes (Signed)
   Subjective:    Patient ID: [name], female    DOB: 09/20/1970, 49 y.o.   MRN: [id number]  The patient is a 49 yrs old wf here with her husband for follow-up on her breast reduction.  She underwent a left mastectomy with reconstruction.  She has a Product manager high profile 123456 cc silicone implant in place 04/12/19.  She just finished her radiation.  Overall she looks good and her skin seems to be recovering nicely from the radiation.  The incision is intact and healing nicely.  She does not have any capsular contracture noted at this time.  She has had some left arm swelling so she has gotten back into physical therapy.  They have a sleeve being made for her.     Review of Systems  Constitutional: Negative.   HENT: Negative.   Eyes: Negative.   Respiratory: Negative.   Cardiovascular: Negative.   Endocrine: Negative.   Genitourinary: Negative.   Musculoskeletal: Negative.   Skin:       Left arm swelling  Neurological: Negative.   Hematological: Negative.   Psychiatric/Behavioral: Negative.        Objective:   Physical Exam Vitals and nursing note reviewed.  Constitutional:      Appearance: Normal appearance.  HENT:     Head: Normocephalic and atraumatic.  Eyes:     Extraocular Movements: Extraocular movements intact.  Cardiovascular:     Rate and Rhythm: Normal rate.  Pulmonary:     Effort: Pulmonary effort is normal.  Skin:    General: Skin is warm.     Capillary Refill: Capillary refill takes less than 2 seconds.  Neurological:     General: No focal deficit present.     Mental Status: She is alert and oriented to person, place, and time.  Psychiatric:        Mood and Affect: Mood normal.        Behavior: Behavior normal.        Thought Content: Thought content normal.        Assessment & Plan:     ICD-10-CM   1. Acquired absence of left breast  Z90.12   2. S/P breast reconstruction  [phone]     Continue with a sports bra for another month at night.   It does not need to be too tight.  She can go into a regular bra but try to avoid a wired bra.  Continue with physical therapy.  Follow back up in 3 months.  Sooner if needed or any questions.  We will then discuss nipple areolar reconstruction.  Pictures were obtained of the patient and placed in the chart with the patient's or guardian's permission.  The [place] was signed into law in 2016 which includes the topic of electronic health records.  This provides immediate access to information in MyChart.  This includes consultation notes, operative notes, office notes, lab results and pathology reports.  If you have any questions about what you read please let us know at your next visit or call us at the office.  We are right here with you.

## 2019-08-08 ENCOUNTER — Inpatient Hospital Stay (HOSPITAL_BASED_OUTPATIENT_CLINIC_OR_DEPARTMENT_OTHER): Payer: 59 | Admitting: Oncology

## 2019-08-08 ENCOUNTER — Inpatient Hospital Stay: Payer: 59

## 2019-08-08 ENCOUNTER — Telehealth: Payer: Self-pay | Admitting: Oncology

## 2019-08-08 ENCOUNTER — Other Ambulatory Visit: Payer: Self-pay

## 2019-08-08 VITALS — BP 119/55 | HR 90 | Temp 97.6°F | Resp 18 | Ht 66.0 in | Wt 156.3 lb

## 2019-08-08 DIAGNOSIS — C50412 Malignant neoplasm of upper-outer quadrant of left female breast: Secondary | ICD-10-CM | POA: Diagnosis not present

## 2019-08-08 DIAGNOSIS — Z170 Estrogen receptor positive status [ER+]: Secondary | ICD-10-CM | POA: Diagnosis not present

## 2019-08-08 LAB — CMP (CANCER CENTER ONLY)
ALT: 21 U/L (ref 0–44)
AST: 19 U/L (ref 15–41)
Albumin: 4 g/dL (ref 3.5–5.0)
Alkaline Phosphatase: 69 U/L (ref 38–126)
Anion gap: 11 (ref 5–15)
BUN: 16 mg/dL (ref 6–20)
CO2: 25 mmol/L (ref 22–32)
Calcium: 9 mg/dL (ref 8.9–10.3)
Chloride: 107 mmol/L (ref 98–111)
Creatinine: 0.91 mg/dL (ref 0.44–1.00)
GFR, Est AFR Am: >60 mL/min (ref >60)
GFR, Estimated: >60 mL/min (ref >60)
Glucose, Bld: 119 mg/dL — ABNORMAL HIGH (ref 70–99)
Potassium: 4.2 mmol/L (ref 3.5–5.1)
Sodium: 143 mmol/L (ref 135–145)
Total Bilirubin: 0.4 mg/dL (ref 0.3–1.2)
Total Protein: 6.6 g/dL (ref 6.5–8.1)

## 2019-08-08 LAB — CBC WITH DIFFERENTIAL (CANCER CENTER ONLY)
Abs Immature Granulocytes: 0 10*3/uL (ref 0.00–0.07)
Basophils Absolute: 0 10*3/uL (ref 0.0–0.1)
Basophils Relative: 1 %
Eosinophils Absolute: 0.2 10*3/uL (ref 0.0–0.5)
Eosinophils Relative: 4 %
HCT: 42.4 % (ref 36.0–46.0)
Hemoglobin: 13.8 g/dL (ref 12.0–15.0)
Immature Granulocytes: 0 %
Lymphocytes Relative: 22 %
Lymphs Abs: 0.9 10*3/uL (ref 0.7–4.0)
MCH: 30.7 pg (ref 26.0–34.0)
MCHC: 32.5 g/dL (ref 30.0–36.0)
MCV: 94.2 fL (ref 80.0–100.0)
Monocytes Absolute: 0.4 10*3/uL (ref 0.1–1.0)
Monocytes Relative: 11 %
Neutro Abs: 2.5 10*3/uL (ref 1.7–7.7)
Neutrophils Relative %: 62 %
Platelet Count: 293 10*3/uL (ref 150–400)
RBC: 4.5 MIL/uL (ref 3.87–5.11)
RDW: 13.7 % (ref 11.5–15.5)
WBC Count: 3.9 10*3/uL — ABNORMAL LOW (ref 4.0–10.5)
nRBC: 0 % (ref 0.0–0.2)

## 2019-08-08 LAB — GENETIC SCREENING ORDER

## 2019-08-08 MED ORDER — ESTRING 2 MG VA RING: 2.0000 mg | VAGINAL_RING | VAGINAL | 12 refills | Status: DC

## 2019-08-08 MED ORDER — TAMOXIFEN CITRATE 20 MG PO TABS: 20.0000 mg | ORAL_TABLET | Freq: Every day | ORAL | 12 refills | Status: AC

## 2019-08-08 NOTE — Telephone Encounter (Signed)
I talk with patient regarding schedule  

## 2019-08-09 ENCOUNTER — Ambulatory Visit: Payer: 59

## 2019-08-09 ENCOUNTER — Ambulatory Visit: Payer: 59 | Admitting: Oncology

## 2019-08-09 DIAGNOSIS — R293 Abnormal posture: Secondary | ICD-10-CM

## 2019-08-09 DIAGNOSIS — C50412 Malignant neoplasm of upper-outer quadrant of left female breast: Secondary | ICD-10-CM | POA: Diagnosis not present

## 2019-08-09 DIAGNOSIS — M25612 Stiffness of left shoulder, not elsewhere classified: Secondary | ICD-10-CM

## 2019-08-09 DIAGNOSIS — I972 Postmastectomy lymphedema syndrome: Secondary | ICD-10-CM

## 2019-08-09 DIAGNOSIS — Z170 Estrogen receptor positive status [ER+]: Secondary | ICD-10-CM

## 2019-08-09 DIAGNOSIS — Z9012 Acquired absence of left breast and nipple: Secondary | ICD-10-CM

## 2019-08-09 NOTE — Therapy (Signed)
[place], [place], [address] Phone: [phone]   Fax:  [phone]  Physical Therapy Treatment  Patient Details  Name: [name] MRN: [id number] Date of Birth: 1971/03/10 Referring Provider (PT): Shona Simpson PA   Encounter Date: 08/09/2019  PT End of Session - 08/09/19 0805    Visit Number  3    Number of Visits  13    Date for PT Re-Evaluation  09/21/19    PT Start Time  0804    PT Stop Time  0900    PT Time Calculation (min)  56 min    Activity Tolerance  Patient tolerated treatment well    Behavior During Therapy  [facility] for tasks assessed/performed       Past Medical History:  Diagnosis Date  . Cancer ([place]) 10/18/2018   left breast cancer  . Family history of cancer     Past Surgical History:  Procedure Laterality Date  . BREAST RECONSTRUCTION WITH PLACEMENT OF TISSUE EXPANDER AND FLEX HD (ACELLULAR HYDRATED DERMIS) Left 10/18/2018   Procedure: IMMEDIATEVBREAST RECONSTRUCTION WITH PLACEMENT OF TISSUE EXPANDER AND FLEX HD (ACELLULAR HYDRATED DERMIS);  Surgeon: Wallace Going, DO;  Location: [place];  Service: Plastics;  Laterality: Left;  Marland Kitchen MASTECTOMY    . MASTECTOMY W/ SENTINEL NODE BIOPSY Left 10/18/2018   Procedure: LEFT MASTECTOMY WITH SENTINEL LYMPH NODE BIOPSY;  Surgeon: Jovita Kussmaul, MD;  Location: [place];  Service: General;  Laterality: Left;  . PORT-A-CATH REMOVAL Right 04/12/2019   Procedure: REMOVAL PORT-A-CATH;  Surgeon: Wallace Going, DO;  Location: [place];  Service: Plastics;  Laterality: Right;  . PORTACATH PLACEMENT Right 11/02/2018   Procedure: INSERTION PORT-A-CATH WITH ULTRASOUND;  Surgeon: Jovita Kussmaul, MD;  Location: [place];  Service: General;  Laterality: Right;  . REMOVAL OF TISSUE EXPANDER AND PLACEMENT OF IMPLANT Left 04/12/2019   Procedure: REMOVAL OF TISSUE EXPANDER  AND PLACEMENT OF IMPLANT;  Surgeon: Wallace Going, DO;  Location: [place];  Service: Plastics;  Laterality: Left;  90 min, please    There were no vitals filed for this visit.  Subjective Assessment - 08/09/19 0806    Subjective  Pt reports that her arm was much less stiff after her last session but did stiffen back up some. She reports that she is now able to put her sports bra on with much less difficutly.    Pertinent History  left mastectomy with SLNB (5 of 9 positive) by Dr. Marlou Starks followed by immediate reconstruction with tissue expander by Dr. [name] on 10/18/2018.    Limitations  Lifting    Patient Stated Goals  I want to reduce the swellingin my arm and increase flexibility.    Currently in Pain?  No/denies                  Outpatient [facility]  Lymphedema Life Impact Scale Total Score  13.24 %           [facility] Adult PT Treatment/Exercise - 08/09/19 0001      Manual Therapy   Manual Therapy  Soft tissue mobilization;Manual Lymphatic Drainage (MLD);Passive ROM;Edema management    Edema Management  1/2 inch gray foam for small edematous area at the medial portion of the anterior chest wall, pt was assisted with donning her sleeve.     Soft tissue mobilization  LIght STM to the  L upper trapezius, L anterior/middle scalenes, L SCM, deltoid, levator scapula and pectoralis major; decreased minimally following light STM.     Manual Lymphatic Drainage (MLD)  Following STM MLD was performed in supine: short neck, swimming in the terminus, Bil shoulders, Bil axillary and L inguinal nodes, anteiror inter axillary anastomosis, above the L incisional area, anterior inter-axillary anastomosis, L axillo-inguinal anastomosis, inferior to the L incisional area, L axillo-inguinal anastomosis,  lateral L brachium, medial to lateral L brachium, lateral L brachium, all surfaces of the L antebrachium and  dorsum of the L hand; re-worked all surfaces and deep abdominals.     Passive ROM  P/ROM into flexion/abduction with myofascial release along the L lateral trunk from axilla to iliac crest and from L axilla to medial brachium with abduction and flexion.              PT Education - 08/09/19 0909    Education Details  Pt will add crossed arm thoracic rotation into scapular stretch to her HEP. She will continue with self MLD and HEP at home. Pt was educated on how to don he compression sleeve including flipping inside out and using the wall as well as correct placement at the styloid process.    Person(s) Educated  Patient    Methods  Explanation;Demonstration    Comprehension  Verbalized understanding;Returned demonstration       PT Short Term Goals - 08/03/19 1119      PT SHORT TERM GOAL #1   Title  Pt will be independent with HEP and self MLD to promote autonomy of care within 3 weeks.    Baseline  pt is currently not working on and HEP or knows how to perform MLD    Time  3    Period  Weeks    Status  New    Target Date  08/31/19      PT SHORT TERM GOAL #2   Title  Patient to be properly fitted with compression garment to wear on daily basis.    Baseline  Pt currently does not have a compression sleeve.    Time  3    Period  Weeks    Status  New    Target Date  08/31/19        PT Long Term Goals - 08/03/19 1120      PT LONG TERM GOAL #1   Title  Pt will improve L shoulder flexion and abduction to 150 degrees within 6 weeks to demonstrate improve functional mobility    Baseline  L shoulder flexion 123, L shoulder abd 89    Time  6    Period  Weeks    Status  New    Target Date  09/21/19      PT LONG TERM GOAL #2   Title  Patient will have reduction of L brachium limb girth by 2 cm within 6 weeks to demonstrate decreased risk for swelling, infection and immobility.    Baseline  see measurements.    Time  6    Period  Weeks    Status  New    Target Date  09/21/19       PT LONG TERM GOAL #3   Title  Pt will report 50% improvement in pain/mobility within 6 weeks in order to demonstrate an improved quality of life.    Baseline  3/10 intermittent pain in the LUE.    Time  6    Period  Weeks  Status  New    Target Date  09/21/19            Plan - 08/09/19 0805    Clinical Impression Statement  Pt presents to physical therapy with palpable tightness/tenderness noted at the L deltoid, pectoralis major, levator scap, STM, anterior/middle scalenes upper trapezius; decreased minimally following light STM. P/ROM performed into flexion and abduction w/myofascial release along the L lateral trunk from axilla to iliac crest and into the L medial brachium. Added seated thoracic rotation into scapular stretch on the L to pt HEP to improve ROM into end range on the L. Pt was presented with small piece of 1/2 inch gray foam for small swollen area at the medial portion of her L incision on the anterior chest wall. MLD was performed for the L anterior chest wall and LUE.  Pt will benefit from continued POC at this time.    Examination-Activity Limitations  Lift;Reach Overhead    Examination-Participation Restrictions  Yard Work;Community Activity    PT Frequency  2x / week    PT Duration  6 weeks    PT Treatment/Interventions  ADLs/Self Care Home Management;Therapeutic exercise;Neuromuscular re-education;Patient/family education;Manual lymph drainage;Passive range of motion    PT Next Visit Plan  assess compression sleeve and STM. See pt authomy with self MLD. progress L shoulder/scapular strengthening.    PT Home Exercise Plan  Access Code: [email or web address]    Consulted and Agree with Plan of Care  Patient       Patient will benefit from skilled therapeutic intervention in order to improve the following deficits and impairments:  Decreased knowledge of use of DME, Decreased range of motion, Pain, Increased edema  Visit Diagnosis: Malignant neoplasm of upper-outer  quadrant of left breast in female, estrogen receptor positive (HCC)  Stiffness of left shoulder, not elsewhere classified  Postmastectomy lymphedema  Abnormal posture  Acquired absence of left breast     Problem List Patient Active Problem List   Diagnosis Date Noted  . Family history of cancer   . S/P breast reconstruction 05/03/2019  . Port-A-Cath in place 11/22/2018  . 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 Malignant neoplasm of upper-outer quadrant of left breast in female, estrogen receptor positive ([place]) 10/10/2018    Ander Purpura, PT 08/09/2019, 9:16 AM  [place], [place], [address] Phone: [phone]   Fax:  [phone]  Name: Diep Copland MRN: [id number] Date of Birth: 05/06/1971

## 2019-08-14 ENCOUNTER — Ambulatory Visit: Payer: 59

## 2019-08-14 ENCOUNTER — Other Ambulatory Visit: Payer: Self-pay

## 2019-08-14 DIAGNOSIS — C50412 Malignant neoplasm of upper-outer quadrant of left female breast: Secondary | ICD-10-CM | POA: Diagnosis not present

## 2019-08-14 DIAGNOSIS — M25612 Stiffness of left shoulder, not elsewhere classified: Secondary | ICD-10-CM

## 2019-08-14 DIAGNOSIS — I972 Postmastectomy lymphedema syndrome: Secondary | ICD-10-CM

## 2019-08-14 DIAGNOSIS — R293 Abnormal posture: Secondary | ICD-10-CM

## 2019-08-14 DIAGNOSIS — Z9012 Acquired absence of left breast and nipple: Secondary | ICD-10-CM

## 2019-08-14 DIAGNOSIS — Z170 Estrogen receptor positive status [ER+]: Secondary | ICD-10-CM

## 2019-08-14 NOTE — Therapy (Signed)
[place], [place], [address] Phone: [phone]   Fax:  [phone]  Physical Therapy Treatment  Patient Details  Name: [name] MRN: [id number] Date of Birth: 05-31-1971 Referring Provider (PT): Shona Simpson PA   Encounter Date: 08/14/2019  PT End of Session - 08/14/19 0804    Visit Number  4    Number of Visits  13    Date for PT Re-Evaluation  09/21/19    PT Start Time  0802    PT Stop Time  [id number]    PT Time Calculation (min)  55 min    Activity Tolerance  Patient tolerated treatment well    Behavior During Therapy  [facility] for tasks assessed/performed       Past Medical History:  Diagnosis Date  . Cancer ([place]) 10/18/2018   left breast cancer  . Family history of cancer     Past Surgical History:  Procedure Laterality Date  . BREAST RECONSTRUCTION WITH PLACEMENT OF TISSUE EXPANDER AND FLEX HD (ACELLULAR HYDRATED DERMIS) Left 10/18/2018   Procedure: IMMEDIATEVBREAST RECONSTRUCTION WITH PLACEMENT OF TISSUE EXPANDER AND FLEX HD (ACELLULAR HYDRATED DERMIS);  Surgeon: Wallace Going, DO;  Location: [place];  Service: Plastics;  Laterality: Left;  Marland Kitchen MASTECTOMY    . MASTECTOMY W/ SENTINEL NODE BIOPSY Left 10/18/2018   Procedure: LEFT MASTECTOMY WITH SENTINEL LYMPH NODE BIOPSY;  Surgeon: Jovita Kussmaul, MD;  Location: [place];  Service: General;  Laterality: Left;  . PORT-A-CATH REMOVAL Right 04/12/2019   Procedure: REMOVAL PORT-A-CATH;  Surgeon: Wallace Going, DO;  Location: [place];  Service: Plastics;  Laterality: Right;  . PORTACATH PLACEMENT Right 11/02/2018   Procedure: INSERTION PORT-A-CATH WITH ULTRASOUND;  Surgeon: Jovita Kussmaul, MD;  Location: [place];  Service: General;  Laterality: Right;  . REMOVAL OF TISSUE EXPANDER AND PLACEMENT OF IMPLANT Left 04/12/2019   Procedure: REMOVAL OF TISSUE EXPANDER  AND PLACEMENT OF IMPLANT;  Surgeon: Wallace Going, DO;  Location: [place];  Service: Plastics;  Laterality: Left;  90 min, please    There were no vitals filed for this visit.  Subjective Assessment - 08/14/19 0804    Subjective  Pt reports that she is experiencing a mild reaction to her compression sleeve becuase it has lanolin which she is allergic too.    Pertinent History  left mastectomy with SLNB (5 of 9 positive) by Dr. Marlou Starks followed by immediate reconstruction with tissue expander by Dr. [name] on 10/18/2018.    Limitations  Lifting    Patient Stated Goals  I want to reduce the swellingin my arm and increase flexibility.    Currently in Pain?  No/denies            LYMPHEDEMA/ONCOLOGY QUESTIONNAIRE - 08/14/19 0852      Left Upper Extremity Lymphedema   15 cm Proximal to Olecranon Process  29 cm    10 cm Proximal to Olecranon Process  28 cm    Olecranon Process  22.8 cm    15 cm Proximal to Ulnar Styloid Process  23.6 cm    10 cm Proximal to Ulnar Styloid Process  21.8 cm    Just Proximal to Ulnar Styloid Process  15 cm    Across Hand at [employer]  18.1 cm    At [place] of 2nd Digit  5.9 cm           Outpatient Rehab  from 08/03/2019 in [facility]  Lymphedema Life Impact Scale Total Score  13.24 %           [facility] Adult PT Treatment/Exercise - 08/14/19 0001      Exercises   Exercises  Shoulder      Shoulder Exercises: Supine   Horizontal ABduction  Strengthening;Both;10 reps    Theraband Level (Shoulder Horizontal ABduction)  Level 1 (Yellow)    Horizontal ABduction Limitations  Demonstration and VC for correct movement     External Rotation  Strengthening;Both;10 reps    Theraband Level (Shoulder External Rotation)  Level 1 (Yellow)    External Rotation Limitations  VC to keep elbows tucked and squeeze shoulder blades     Flexion  Strengthening;Both;10 reps    Theraband Level (Shoulder  Flexion)  Level 1 (Yellow)    Flexion Limitations  Demonstration and VC to maintain tension on the band throughout movement and Tactile cueing for correct thumb direction to prevent impingment.     Diagonals  Left;10 reps;Strengthening    Theraband Level (Shoulder Diagonals)  Level 1 (Yellow)    Diagonals Limitations  Demonstration w/correct movement, VC and tactile cueing for hand placement and wrist rotation for correct movement.       Manual Therapy   Manual Therapy  Soft tissue mobilization;Manual Lymphatic Drainage (MLD);Passive ROM;Edema management    Soft tissue mobilization  Light STM to the L upper trapezius, deltoid, biceps, pectoralis major and anterior/middle scalenes; decreased moderately.     Manual Lymphatic Drainage (MLD)  Following STM MLD was performed in supine: short neck, swimming in the terminus, Bil shoulders, Bil axillary and L inguinal nodes, anteiror inter axillary anastomosis, L axillo-inguinal anastomosis, inferior to the L incisional area, L axillo-inguinal anastomosis,  lateral L brachium, medial to lateral L brachium, lateral L brachium, all surfaces of the L antebrachium and dorsum of the L hand; re-worked all surfaces and deep abdominals.     Passive ROM  P/ROM into flexion/abduction with light stretch at end range.              PT Education - 08/14/19 0858    Education Details  Pt will perform scapular series at home in supine using the yellow theraband. She will continue with self MLD at home and wearing compression sleeve.    Person(s) Educated  Patient    Methods  Explanation;Demonstration;Tactile cues;Verbal cues;Handout    Comprehension  Verbalized understanding;Returned demonstration       PT Short Term Goals - 08/03/19 1119      PT SHORT TERM GOAL #1   Title  Pt will be independent with HEP and self MLD to promote autonomy of care within 3 weeks.    Baseline  pt is currently not working on and HEP or knows how to perform MLD    Time  3     Period  Weeks    Status  New    Target Date  08/31/19      PT SHORT TERM GOAL #2   Title  Patient to be properly fitted with compression garment to wear on daily basis.    Baseline  Pt currently does not have a compression sleeve.    Time  3    Period  Weeks    Status  New    Target Date  08/31/19        PT Long Term Goals - 08/03/19 1120      PT LONG TERM GOAL #1   Title  Pt will improve L shoulder flexion and abduction to 150 degrees within 6 weeks to demonstrate improve functional mobility    Baseline  L shoulder flexion 123, L shoulder abd 89    Time  6    Period  Weeks    Status  New    Target Date  09/21/19      PT LONG TERM GOAL #2   Title  Patient will have reduction of L brachium limb girth by 2 cm within 6 weeks to demonstrate decreased risk for swelling, infection and immobility.    Baseline  see measurements.    Time  6    Period  Weeks    Status  New    Target Date  09/21/19      PT LONG TERM GOAL #3   Title  Pt will report 50% improvement in pain/mobility within 6 weeks in order to demonstrate an improved quality of life.    Baseline  3/10 intermittent pain in the LUE.    Time  6    Period  Weeks    Status  New    Target Date  09/21/19            Plan - 08/14/19 0803    Clinical Impression Statement  Palpable tightness/tenderness continues at the L deltoid, biceps, upper trapezius, scalenes; decreased minimally after light STM. MLD was performed following MLD to the LUE and the inferior portion of the anterior L chest wall below reconstruction incision. Pt was able to tolerate an increase in difficulty with ther-ex this session with scapular series w/o an increase in pain from baseline. Pt presents with a pocket of tissue vs. fluid in the medial inferior anteroir L upper quadrant near the sternum; possible trial of dense small nubbed foam to determine. Pt is reporting itching with her sleeve and was encouraged to return this sleeve for another possibly a  Juzo soft to decrease the itching to prevent inflammation and increased fluid in the LUE. Pt will benefit from continued POC at this time.    Examination-Activity Limitations  Lift;Reach Overhead    Examination-Participation Restrictions  Yard Work;Community Activity    Rehab Potential  Excellent    PT Frequency  2x / week    PT Duration  6 weeks    PT Treatment/Interventions  ADLs/Self Care Home Management;Therapeutic exercise;Neuromuscular re-education;Patient/family education;Manual lymph drainage;Passive range of motion    PT Next Visit Plan  scapular series assess, assess compression sleeve and STM. See pt autonomy with self MLD. progress L shoulder/scapular strengthening.    PT Home Exercise Plan  Access Code: [email or web address]    Consulted and Agree with Plan of Care  Patient       Patient will benefit from skilled therapeutic intervention in order to improve the following deficits and impairments:  Decreased knowledge of use of DME, Decreased range of motion, Pain, Increased edema  Visit Diagnosis: Malignant neoplasm of upper-outer quadrant of left breast in female, estrogen receptor positive (HCC)  Stiffness of left shoulder, not elsewhere classified  Postmastectomy lymphedema  Abnormal posture  Acquired absence of left breast     Problem List Patient Active Problem List   Diagnosis Date Noted  . Family history of cancer   . S/P breast reconstruction 05/03/2019  . Port-A-Cath in place 11/22/2018  . 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 Malignant neoplasm of upper-outer quadrant of left breast in female, estrogen receptor positive ([place]) 10/10/2018    Ander Purpura, PT 08/14/2019, 9:04 AM  [place], [place], [address] Phone: [phone]   Fax:  [phone]  Name: [name] MRN: [id number] Date of Birth: [birth year]

## 2019-08-14 NOTE — Patient Instructions (Signed)
Over Head Pull: Narrow and Wide Grip   Cancer Rehab [phone]   On back, knees bent, feet flat, band across thighs, elbows straight but relaxed. Pull hands apart (start). Keeping elbows straight, bring arms up and over head, hands toward floor. Keep pull steady on band. Hold momentarily. Return slowly, keeping pull steady, back to start. Then do same with a wider grip on the band (past shoulder width) Repeat _10__ times. Band color __yellow____   Side Pull: Double Arm   On back, knees bent, feet flat. Arms perpendicular to body, shoulder level, elbows straight but relaxed. Pull arms out to sides, elbows straight. Resistance band comes across collarbones, hands toward floor. Hold momentarily. Slowly return to starting position. Repeat _5-10__ times. Band color _yellow____   Sword   On back, knees bent, feet flat, left hand on left hip, right hand above left. Pull right arm DIAGONALLY (hip to shoulder) across chest. Bring right arm along head toward floor. Hold momentarily. Slowly return to starting position. Repeat _10__ times. Do with left arm. Band color _yellow_____   Shoulder Rotation: Double Arm   On back, knees bent, feet flat, elbows tucked at sides, bent 90, hands palms up. Pull hands apart and down toward floor, keeping elbows near sides. Hold momentarily. Slowly return to starting position. Repeat _10__ times. Band color __yellow____

## 2019-08-16 ENCOUNTER — Other Ambulatory Visit: Payer: Self-pay

## 2019-08-16 ENCOUNTER — Ambulatory Visit: Payer: 59

## 2019-08-16 DIAGNOSIS — R293 Abnormal posture: Secondary | ICD-10-CM

## 2019-08-16 DIAGNOSIS — Z9012 Acquired absence of left breast and nipple: Secondary | ICD-10-CM

## 2019-08-16 DIAGNOSIS — C50412 Malignant neoplasm of upper-outer quadrant of left female breast: Secondary | ICD-10-CM | POA: Diagnosis not present

## 2019-08-16 DIAGNOSIS — M25612 Stiffness of left shoulder, not elsewhere classified: Secondary | ICD-10-CM

## 2019-08-16 DIAGNOSIS — I972 Postmastectomy lymphedema syndrome: Secondary | ICD-10-CM

## 2019-08-16 DIAGNOSIS — Z170 Estrogen receptor positive status [ER+]: Secondary | ICD-10-CM

## 2019-08-16 NOTE — Patient Instructions (Signed)
Access Code: [email or web address]  URL: [email or web address]  Date: 08/16/2019  Prepared by: Tomma Rakers   Exercises Standing Pec Stretch at [place] - 3 reps - 1 sets - 20 seconds hold - 1x daily - 7x weekly

## 2019-08-16 NOTE — Therapy (Signed)
[place], [place], [address] Phone: [phone]   Fax:  [phone]  Physical Therapy Treatment  Patient Details  Name: [name] MRN: [id number] Date of Birth: 06-22-1971 Referring Provider (PT): Shona Simpson PA   Encounter Date: 08/16/2019  PT End of Session - 08/16/19 0805    Visit Number  5    Number of Visits  13    Date for PT Re-Evaluation  09/21/19    PT Start Time  0804    PT Stop Time  0858    PT Time Calculation (min)  54 min    Activity Tolerance  Patient tolerated treatment well    Behavior During Therapy  [facility] for tasks assessed/performed       Past Medical History:  Diagnosis Date  . Cancer ([place]) 10/18/2018   left breast cancer  . Family history of cancer     Past Surgical History:  Procedure Laterality Date  . BREAST RECONSTRUCTION WITH PLACEMENT OF TISSUE EXPANDER AND FLEX HD (ACELLULAR HYDRATED DERMIS) Left 10/18/2018   Procedure: IMMEDIATEVBREAST RECONSTRUCTION WITH PLACEMENT OF TISSUE EXPANDER AND FLEX HD (ACELLULAR HYDRATED DERMIS);  Surgeon: Wallace Going, DO;  Location: [place];  Service: Plastics;  Laterality: Left;  Marland Kitchen MASTECTOMY    . MASTECTOMY W/ SENTINEL NODE BIOPSY Left 10/18/2018   Procedure: LEFT MASTECTOMY WITH SENTINEL LYMPH NODE BIOPSY;  Surgeon: Jovita Kussmaul, MD;  Location: [place];  Service: General;  Laterality: Left;  . PORT-A-CATH REMOVAL Right 04/12/2019   Procedure: REMOVAL PORT-A-CATH;  Surgeon: Wallace Going, DO;  Location: [place];  Service: Plastics;  Laterality: Right;  . PORTACATH PLACEMENT Right 11/02/2018   Procedure: INSERTION PORT-A-CATH WITH ULTRASOUND;  Surgeon: Jovita Kussmaul, MD;  Location: [place];  Service: General;  Laterality: Right;  . REMOVAL OF TISSUE EXPANDER AND PLACEMENT OF IMPLANT Left 04/12/2019   Procedure: REMOVAL OF TISSUE EXPANDER  AND PLACEMENT OF IMPLANT;  Surgeon: Wallace Going, DO;  Location: [place];  Service: Plastics;  Laterality: Left;  90 min, please    There were no vitals filed for this visit.  Subjective Assessment - 08/16/19 0805    Subjective  Pt reports that she is still wearing the old sleeve but seems to be calming down with each wash. She states that she did call a special place and they are going to replace her sleeve with one that does not have Lanolin in it.    Pertinent History  left mastectomy with SLNB (5 of 9 positive) by Dr. Marlou Starks followed by immediate reconstruction with tissue expander by Dr. [name] on 10/18/2018.    Limitations  Lifting    Patient Stated Goals  I want to reduce the swellingin my arm and increase flexibility.    Currently in Pain?  No/denies                  [facility]patient [facility]  Lymphedema Life Impact Scale Total Score  13.24 %           [facility] Adult PT Treatment/Exercise - 08/16/19 0001      Shoulder Exercises: Supine   Horizontal ABduction  Strengthening;Both;10 reps    Horizontal ABduction Weight (lbs)  2    Horizontal ABduction Limitations  VC for correct movement and to avoid pain, stretching and a little strain are okay.     External Rotation  Strengthening;Both;10 reps    External Rotation Weight (lbs)  2    External Rotation Limitations  Good form throughout    Flexion  Strengthening;Both;10 reps    Shoulder Flexion Weight (lbs)  1 and 2     Flexion Limitations  chest press with flexion 1# chest press then 1# chest press w/flexion followed by 2# with good form and no compensation. VC to avoid pain     Diagonals  10 reps;Strengthening;Left    Diagonals Weight (lbs)  2    Diagonals Limitations  VC for correct movement and to avoid pain, good form throughout.     Other Supine Exercises  unilateral pec stretch on the wall, demonstration w/VC to avoid pain and  slightly bend elbow to decrease stress on wrist flexors 3x 20 seconds      Manual Therapy   Manual Therapy  Soft tissue mobilization;Manual Lymphatic Drainage (MLD);Edema management    Edema Management  pt provided with wavy komprex foam on 1/2 inch gray foam for small area at the medial/inferior L breast to see if this is tissue or edema.     Soft tissue mobilization  Light STM to the L upper trapezius, deltoid, biceps, pectoralis major, SCM, rhomboids and anterior/middle scalenes; decreased moderately.     Manual Lymphatic Drainage (MLD)  Following STM MLD was performed in supine: short neck, swimming in the terminus, Bil shoulders, Bil axillary and L inguinal nodes, anteiror inter axillary anastomosis, L axillo-inguinal anastomosis, inferior to the L incisional area, L axillo-inguinal anastomosis,  lateral L brachium, medial to lateral L brachium, lateral L brachium, all surfaces of the L antebrachium and dorsum of the L hand; re-worked all surfaces, 5 diaphragmatic breathes             PT Education - 08/16/19 0900    Education Details  Access Code: [email or web address], pt will continue with scapular series with 3# for all exercises except 2# for horizontal abduction. she will ad unilateral pec stretch on the wall.    Person(s) Educated  Patient    Methods  Explanation;Demonstration;Handout;Verbal cues    Comprehension  Verbalized understanding;Returned demonstration       PT Short Term Goals - 08/03/19 1119      PT SHORT TERM GOAL #1   Title  Pt will be independent with HEP and self MLD to promote autonomy of care within 3 weeks.    Baseline  pt is currently not working on and HEP or knows how to perform MLD    Time  3    Period  Weeks    Status  New    Target Date  08/31/19      PT SHORT TERM GOAL #2   Title  Patient to be properly fitted with compression garment to wear on daily basis.    Baseline  Pt currently does not have a compression sleeve.    Time  3    Period  Weeks     Status  New    Target Date  08/31/19        PT Long Term Goals - 08/03/19 1120      PT LONG TERM GOAL #1   Title  Pt will improve L shoulder flexion and abduction to 150 degrees within 6 weeks to demonstrate improve functional mobility    Baseline  L shoulder flexion 123, L shoulder abd 89    Time  6    Period  Weeks    Status  New    Target  Date  09/21/19      PT LONG TERM GOAL #2   Title  Patient will have reduction of L brachium limb girth by 2 cm within 6 weeks to demonstrate decreased risk for swelling, infection and immobility.    Baseline  see measurements.    Time  6    Period  Weeks    Status  New    Target Date  09/21/19      PT LONG TERM GOAL #3   Title  Pt will report 50% improvement in pain/mobility within 6 weeks in order to demonstrate an improved quality of life.    Baseline  3/10 intermittent pain in the LUE.    Time  6    Period  Weeks    Status  New    Target Date  09/21/19            Plan - 08/16/19 0804    Clinical Impression Statement  Palpable tightness/tenderness continues at the L deltoid, biceps, upper trapezius, SCM, rhomboids and scalenes; decreases minimally following light STM. MLD is performed following MLD to the LUE and the L anterior chest wall above and below incision on reconstruction. Pt was able to tolerate weights with increased resistance this session for ther-ex. Unilateral pec stretch was initiated this session and pt was provided with a hand out for correct movement. Pt will benefit fro continued POC at this time.    Examination-Activity Limitations  Lift;Reach Overhead    Examination-Participation Restrictions  Yard Work;Community Activity    Rehab Potential  Excellent    PT Frequency  2x / week    PT Duration  6 weeks    PT Treatment/Interventions  ADLs/Self Care Home Management;Therapeutic exercise;Neuromuscular re-education;Patient/family education;Manual lymph drainage;Passive range of motion    PT Next Visit Plan   scapular series assess, progress L shoulder/scapular strengthening, assess compression sleeve and STM. See pt autonomy with self MLD.    PT Home Exercise Plan  Access Code: [email or web address]    Consulted and Agree with Plan of Care  Patient       Patient will benefit from skilled therapeutic intervention in order to improve the following deficits and impairments:  Decreased knowledge of use of DME, Decreased range of motion, Pain, Increased edema  Visit Diagnosis: Malignant neoplasm of upper-outer quadrant of left breast in female, estrogen receptor positive (HCC)  Stiffness of left shoulder, not elsewhere classified  Postmastectomy lymphedema  Abnormal posture  Acquired absence of left breast     Problem List Patient Active Problem List   Diagnosis Date Noted  . Family history of cancer   . S/P breast reconstruction 05/03/2019  . Port-A-Cath in place 11/22/2018  . 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 Malignant neoplasm of upper-outer quadrant of left breast in female, estrogen receptor positive ([place]) 10/10/2018    Ander Purpura, PT 08/16/2019, 11:15 AM  [place], [place], [address] Phone: [phone]   Fax:  [phone]  Name: [name] MRN: [id number] Date of Birth: 1970-08-06

## 2019-08-17 ENCOUNTER — Telehealth: Payer: Self-pay | Admitting: Licensed Clinical Social Worker

## 2019-08-20 ENCOUNTER — Ambulatory Visit: Payer: Self-pay | Admitting: Licensed Clinical Social Worker

## 2019-08-20 ENCOUNTER — Encounter: Payer: Self-pay | Admitting: Licensed Clinical Social Worker

## 2019-08-20 DIAGNOSIS — Z1379 Encounter for other screening for genetic and chromosomal anomalies: Secondary | ICD-10-CM | POA: Insufficient documentation

## 2019-08-20 DIAGNOSIS — C50412 Malignant neoplasm of upper-outer quadrant of left female breast: Secondary | ICD-10-CM

## 2019-08-20 DIAGNOSIS — Z809 Family history of malignant neoplasm, unspecified: Secondary | ICD-10-CM

## 2019-08-20 NOTE — Telephone Encounter (Signed)
Revealed negative genetic testing.  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 This normal result is reassuring and indicates that it is unlikely [name]'s cancer is due to a hereditary cause.  It is unlikely that there is an increased risk of another cancer due to a mutation in one of these genes.  However, genetic testing is not perfect, and cannot definitively rule out a hereditary cause.  It will be important for her to keep in contact with genetics to learn if any additional testing may be needed in the future.

## 2019-08-20 NOTE — Progress Notes (Signed)
HPI:  Ms. Revak was previously seen in the [facility] clinic due to a personal history of cancer and concerns regarding a hereditary predisposition to cancer. Please refer to our prior cancer genetics clinic note for more information regarding our discussion, assessment and recommendations, at the time. Ms. Morace recent genetic test results were disclosed to her, as were recommendations warranted by these results. These results and recommendations are discussed in more detail below.  CANCER HISTORY:  Oncology History  Malignant neoplasm of upper-outer quadrant of left breast in female, estrogen receptor positive ([place])  10/10/2018 Initial Diagnosis   Malignant neoplasm of upper-outer quadrant of left breast in female, estrogen receptor positive ([place])   11/22/2018 -  Chemotherapy   The patient had DOXOrubicin (ADRIAMYCIN) chemo injection 104 mg, 60 mg/m2 = 104 mg, Intravenous,  Once, 4 of 4 cycles Administration: 104 mg (11/22/2018), 104 mg (12/05/2018), 104 mg (12/19/2018), 104 mg (01/02/2019) palonosetron (ALOXI) injection 0.25 mg, 0.25 mg, Intravenous,  Once, 4 of 4 cycles Administration: 0.25 mg (11/22/2018), 0.25 mg (12/05/2018), 0.25 mg (12/19/2018), 0.25 mg (01/02/2019) pegfilgrastim (NEULASTA ONPRO KIT) injection 6 mg, 6 mg, Subcutaneous, Once, 4 of 4 cycles Administration: 6 mg (11/22/2018), 6 mg (12/05/2018), 6 mg (12/19/2018), 6 mg (01/02/2019) cyclophosphamide (CYTOXAN) 1,040 mg in sodium chloride 0.9 % 250 mL chemo infusion, 600 mg/m2 = 1,040 mg, Intravenous,  Once, 4 of 4 cycles Administration: 1,040 mg (11/22/2018), 1,040 mg (12/05/2018), 1,040 mg (12/19/2018), 1,040 mg (01/02/2019) PACLitaxel (TAXOL) 138 mg in sodium chloride 0.9 % 250 mL chemo infusion (</= 34m/m2), 80 mg/m2 = 138 mg, Intravenous,  Once, 11 of 11 cycles Administration: 138 mg (01/15/2019), 138 mg (01/22/2019), 138 mg (01/29/2019), 138 mg (02/05/2019), 138 mg (02/12/2019), 138 mg (02/19/2019), 138 mg (02/26/2019), 138 mg  (03/05/2019), 138 mg (03/19/2019), 138 mg (03/27/2019), 138 mg (04/02/2019) fosaprepitant (EMEND) 150 mg, dexamethasone (DECADRON) 12 mg in sodium chloride 0.9 % 145 mL IVPB, , Intravenous,  Once, 4 of 4 cycles Administration:  (11/22/2018),  (12/05/2018),  (12/19/2018),  (01/02/2019)  for chemotherapy treatment.     Genetic Testing   Negative genetic testing. 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 The report date is 08/16/2019.  The Common Hereditary Cancers Panel offered by [facility] includes sequencing and/or deletion duplication testing of the following 48 genes: APC, ATM, AXIN2, BARD1, BMPR1A, BRCA1, BRCA2, BRIP1, CDH1, CDKN2A (p14ARF), CDKN2A (p16INK4a), CKD4, CHEK2, CTNNA1, DICER1, EPCAM (Deletion/duplication testing only), GREM1 (promoter region deletion/duplication testing only), KIT, MEN1, MLH1, MSH2, MSH3, MSH6, MUTYH, NBN, NF1, NHTL1, PALB2, PDGFRA, PMS2, POLD1, POLE, PTEN, RAD50, RAD51C, RAD51D, RNF43, SDHB, SDHC, SDHD, SMAD4, SMARCA4. STK11, TP53, TSC1, TSC2, and VHL.  The following genes were evaluated for sequence changes only: SDHA and HOXB13 c.251G>A variant only.     FAMILY HISTORY:  We obtained a detailed, 4-generation family history.  Significant diagnoses are listed below: Family History  Problem Relation Age of Onset  . Skin cancer Father        on back  . Skin cancer Paternal Grandmother        on nose  . Breast cancer Neg Hx   . Ovarian cancer Neg Hx     Ms. Mroz has 1 daughter, 139 and 1 son, 193 She has a sister, 537 no history of cancer. She has 3 nieces, no cancers.  Ms. SMcneelymother is living at 784 no cancer. Patient has 1 maternal aunt, no cancers. She has 1 maternal uncle, no cancers. She has  5 maternal cousins, no cancers. Ms. Wrede maternal grandparents both passed in their 110s. Her grandmother possibly had cancer.  Ms. Massie Maroon father has had skin cancer on his back, removed. Patient's father was an  only child so she does not have aunts/uncles/cousins. Paternal grandmother passed at 6 and had history of skin cancer on her nose. Paternal grandfather passed at 38.  Ms. Plouffe is unaware of previous family history of genetic testing for hereditary cancer risks. Patient's maternal ancestors are of [place] descent, and paternal ancestors are of English/German descent. There is no reported Ashkenazi Jewish ancestry. There is no known consanguinity.  GENETIC TEST RESULTS: Genetic testing reported out on 08/16/2019 through the [facility] Common Hereditary cancer panel found no pathogenic mutations. The Common Hereditary Cancers Panel offered by [facility] includes sequencing and/or deletion duplication testing of the following 48 genes: APC, ATM, AXIN2, BARD1, BMPR1A, BRCA1, BRCA2, BRIP1, CDH1, CDKN2A (p14ARF), CDKN2A (p16INK4a), CKD4, CHEK2, CTNNA1, DICER1, EPCAM (Deletion/duplication testing only), GREM1 (promoter region deletion/duplication testing only), KIT, MEN1, MLH1, MSH2, MSH3, MSH6, MUTYH, NBN, NF1, NHTL1, PALB2, PDGFRA, PMS2, POLD1, POLE, PTEN, RAD50, RAD51C, RAD51D, RNF43, SDHB, SDHC, SDHD, SMAD4, SMARCA4. STK11, TP53, TSC1, TSC2, and VHL.  The following genes were evaluated for sequence changes only: SDHA and HOXB13 c.251G>A variant only. The test report has been scanned into EPIC and is located under the Molecular Pathology section of the Results Review tab.  A portion of the result report is included below for reference.     We discussed with Ms. Magistro that because current genetic testing is not perfect, it is possible there may be a gene mutation in one of these genes that current testing cannot detect, but that chance is small.  We also discussed, that there could be another gene that has not yet been discovered, or that we have not yet tested, that is responsible for the cancer diagnoses in the family. It is also possible there is a hereditary cause for the cancer in the family that Ms. Rainwater  did not inherit and therefore was not identified in her testing.  Therefore, it is important to remain in touch with cancer genetics in the future so that we can continue to offer Ms. Loyal the most up to date genetic testing.   Genetic testing did identify a variant of uncertain significance (VUS) was identified in the MLH1 gene called c.1874A>G.  At this time, it is unknown if this variant is associated with increased cancer risk or if this is a normal finding, but most variants such as this get reclassified to being inconsequential. It should not be used to make medical management decisions. With time, we suspect the lab will determine the significance of this variant, if any. If we do learn more about it, we will try to contact Ms. Kalata to discuss it further. However, it is important to stay in touch with [place] periodically and keep the address and phone number up to date.  ADDITIONAL GENETIC TESTING: We discussed with Ms. Woodham that her genetic testing was fairly extensive.  If there are genes identified to increase cancer risk that can be analyzed in the future, we would be happy to discuss and coordinate this testing at that time.    CANCER SCREENING RECOMMENDATIONS: Ms. Detamore test result is considered negative (normal).  This means that we have not identified a hereditary cause for her  personal and family history of cancer at this time. Most cancers happen by chance and this negative test suggests that her  cancer may fall into this category.    While reassuring, this does not definitively rule out a hereditary predisposition to cancer. It is still possible that there could be genetic mutations that are undetectable by current technology. There could be genetic mutations in genes that have not been tested or identified to increase cancer risk.  Therefore, it is recommended she continue to follow the cancer management and screening guidelines provided by her oncology and primary healthcare  provider.   An individual's cancer risk and medical management are not determined by genetic test results alone. Overall cancer risk assessment incorporates additional factors, including personal medical history, family history, and any available genetic information that may result in a personalized plan for cancer prevention and surveillance.  RECOMMENDATIONS FOR FAMILY MEMBERS:  Relatives in this family might be at some increased risk of developing cancer, over the general population risk, simply due to the family history of cancer.  We recommended female relatives in this family have a yearly mammogram beginning at age 61, or 29 years younger than the earliest onset of cancer, an annual clinical breast exam, and perform monthly breast self-exams. Female relatives in this family should also have a gynecological exam as recommended by their primary provider. All family members should have a colonoscopy by age 42, or as directed by their physicians.  FOLLOW-UP: Lastly, we discussed with Ms. Mount that cancer genetics is a rapidly advancing field and it is possible that new genetic tests will be appropriate for her and/or her family members in the future. We encouraged her to remain in contact with cancer genetics on an annual basis so we can update her personal and family histories and let her know of advances in cancer genetics that may benefit this family.   Our contact number was provided. Ms. Wahab questions were answered to her satisfaction, and she knows she is welcome to call us at anytime with additional questions or concerns.   Faith Rogue, MS, [facility] Genetic Counselor [email or web address] Phone: [phone]

## 2019-08-21 ENCOUNTER — Ambulatory Visit: Payer: 59 | Attending: Radiation Oncology

## 2019-08-21 ENCOUNTER — Other Ambulatory Visit: Payer: Self-pay

## 2019-08-21 DIAGNOSIS — R293 Abnormal posture: Secondary | ICD-10-CM | POA: Insufficient documentation

## 2019-08-21 DIAGNOSIS — M25612 Stiffness of left shoulder, not elsewhere classified: Secondary | ICD-10-CM | POA: Insufficient documentation

## 2019-08-21 DIAGNOSIS — C50412 Malignant neoplasm of upper-outer quadrant of left female breast: Secondary | ICD-10-CM | POA: Diagnosis present

## 2019-08-21 DIAGNOSIS — Z170 Estrogen receptor positive status [ER+]: Secondary | ICD-10-CM | POA: Diagnosis present

## 2019-08-21 DIAGNOSIS — Z9012 Acquired absence of left breast and nipple: Secondary | ICD-10-CM | POA: Diagnosis present

## 2019-08-21 DIAGNOSIS — I972 Postmastectomy lymphedema syndrome: Secondary | ICD-10-CM | POA: Insufficient documentation

## 2019-08-21 NOTE — Therapy (Signed)
[place], [place], [address] Phone: [phone]   Fax:  [phone]  Physical Therapy Treatment  Patient Details  Name: [name] MRN: [id number] Date of Birth: March 12, 1971 Referring Provider (PT): Shona Simpson PA   Encounter Date: 08/21/2019  PT End of Session - 08/21/19 0806    Visit Number  6    Number of Visits  13    Date for PT Re-Evaluation  09/21/19    PT Start Time  0804    PT Stop Time  0900    PT Time Calculation (min)  56 min    Activity Tolerance  Patient tolerated treatment well    Behavior During Therapy  [facility] for tasks assessed/performed       Past Medical History:  Diagnosis Date  . Cancer ([place]) 10/18/2018   left breast cancer  . Family history of cancer     Past Surgical History:  Procedure Laterality Date  . BREAST RECONSTRUCTION WITH PLACEMENT OF TISSUE EXPANDER AND FLEX HD (ACELLULAR HYDRATED DERMIS) Left 10/18/2018   Procedure: IMMEDIATEVBREAST RECONSTRUCTION WITH PLACEMENT OF TISSUE EXPANDER AND FLEX HD (ACELLULAR HYDRATED DERMIS);  Surgeon: Wallace Going, DO;  Location: [facility];  Service: Plastics;  Laterality: Left;  Marland Kitchen MASTECTOMY    . MASTECTOMY W/ SENTINEL NODE BIOPSY Left 10/18/2018   Procedure: LEFT MASTECTOMY WITH SENTINEL LYMPH NODE BIOPSY;  Surgeon: Jovita Kussmaul, MD;  Location: [facility];  Service: General;  Laterality: Left;  . PORT-A-CATH REMOVAL Right 04/12/2019   Procedure: REMOVAL PORT-A-CATH;  Surgeon: Wallace Going, DO;  Location: [facility];  Service: Plastics;  Laterality: Right;  . PORTACATH PLACEMENT Right 11/02/2018   Procedure: INSERTION PORT-A-CATH WITH ULTRASOUND;  Surgeon: Jovita Kussmaul, MD;  Location: [facility] [place];  Service: General;  Laterality: Right;  . REMOVAL OF TISSUE EXPANDER AND PLACEMENT OF IMPLANT Left 04/12/2019   Procedure: REMOVAL OF TISSUE EXPANDER AND  PLACEMENT OF IMPLANT;  Surgeon: Wallace Going, DO;  Location: [facility];  Service: Plastics;  Laterality: Left;  90 min, please    There were no vitals filed for this visit.  Subjective Assessment - 08/21/19 0806    Subjective  Pt reports that her arm is doing really well and that she does not notice it anymore in her daily life.    Pertinent History  left mastectomy with SLNB (5 of 9 positive) by Dr. Marlou Starks followed by immediate reconstruction with tissue expander by Dr. [name] on 10/18/2018.    Limitations  Lifting    Patient Stated Goals  I want to reduce the swellingin my arm and increase flexibility.    Currently in Pain?  No/denies         [facility] PT Assessment - 08/21/19 0001      AROM   Left Shoulder Flexion  150 Degrees    Left Shoulder ABduction  140 Degrees    Left Shoulder Internal Rotation  85 Degrees    Left Shoulder External Rotation  86 Degrees        LYMPHEDEMA/ONCOLOGY QUESTIONNAIRE - 08/21/19 0814      Left Upper Extremity Lymphedema   15 cm Proximal to Olecranon Process  27.4 cm    10 cm Proximal to Olecranon Process  26.9 cm    Olecranon Process  22 cm    15 cm Proximal to Ulnar Styloid Process  23 cm    10 cm Proximal to Ulnar  Styloid Process  21.4 cm    Just Proximal to Ulnar Styloid Process  15 cm    Across Hand at [employer]  18 cm    At [place] of 2nd Digit  5.8 cm           Outpatient Rehab from [facility]/03/[facility]  Lymphedema Life Impact Scale Total Score  13.24 %           [facility] Adult PT Treatment/Exercise - 08/21/19 0001      Exercises   Exercises  Other Exercises    Other Exercises   strength ABC exercises: tricep stretch 20 seconds, unilateral pec stretch 20 seconds, posterior shoulder stretch 20 seconds, Modified butter fly stretch with figure 4 20 seconds Bil, gastroc stretch at wall 20 seconds, seated hamstring strech 20 seconds, quad stretch 20 seconds Bil. Stopped  at page 11 today.       Manual Therapy   Manual Therapy  Soft tissue mobilization;Manual Lymphatic Drainage (MLD)    Soft tissue mobilization  light STM to the L deltoid, upper trapezius, triceps/biceps; decreased moderately in the triceps and minimally in the other named muscles.     Manual Lymphatic Drainage (MLD)  Following STM MLD was performed in supine: short neck, swimming in the terminus, Bil shoulders, Bil axillary and L inguinal nodes, anteiror inter axillary anastomosis, L axillo-inguinal anastomosis, inferior to the L incisional area, L axillo-inguinal anastomosis,  lateral L brachium, medial to lateral L brachium, lateral L brachium, all surfaces of the L antebrachium and dorsum of the L hand; re-worked all surfaces, 5 diaphragmatic breathes             PT Education - 08/21/19 0900    Education Details  Pt will continue with exercises at home, wearing compression sleeve, performing self MLD and wearing dense kompex foam at medial breast.    Person(s) Educated  Patient    Methods  Explanation;Demonstration;Verbal cues;Handout    Comprehension  Verbalized understanding;Returned demonstration       PT Short Term Goals - 08/03/19 1119      PT SHORT TERM GOAL #1   Title  Pt will be independent with HEP and self MLD to promote autonomy of care within 3 weeks.    Baseline  pt is currently not working on and HEP or knows how to perform MLD    Time  3    Period  Weeks    Status  New    Target Date  08/31/19      PT SHORT TERM GOAL #2   Title  Patient to be properly fitted with compression garment to wear on daily basis.    Baseline  Pt currently does not have a compression sleeve.    Time  3    Period  Weeks    Status  New    Target Date  08/31/19        PT Long Term Goals - 08/03/19 1120      PT LONG TERM GOAL #1   Title  Pt will improve L shoulder flexion and abduction to 150 degrees within 6 weeks to demonstrate improve functional mobility    Baseline  L  shoulder flexion 123, L shoulder abd 89    Time  6    Period  Weeks    Status  New    Target Date  09/21/19      PT LONG TERM GOAL #2   Title  Patient will have reduction  of L brachium limb girth by 2 cm within 6 weeks to demonstrate decreased risk for swelling, infection and immobility.    Baseline  see measurements.    Time  6    Period  Weeks    Status  New    Target Date  09/21/19      PT LONG TERM GOAL #3   Title  Pt will report 50% improvement in pain/mobility within 6 weeks in order to demonstrate an improved quality of life.    Baseline  3/10 intermittent pain in the LUE.    Time  6    Period  Weeks    Status  New    Target Date  09/21/19            Plan - 08/21/19 0805    Clinical Impression Statement  Pt presents to physical therapy with significant decrease in palpable tightness/tenderness at the L deltoid, biceps and upper trapezius she has significant tightness/tenderness in the L triceps; decreased moderately in triceps and minimally in all other muscles after STM. MLD was performed following STM in order to decrease risk for fluid build up. Area in the L anterior chest wall medial/inferior to the L breast reconstruction seems to have decreased minimally on visual inspection with weraing dense komprex foam. She will continue to wear foam at this time. Began strength after breast cancer exercises today with good results. Pt will benefit from continued POC at this time.    Examination-Activity Limitations  Lift;Reach Overhead    Examination-Participation Restrictions  Yard Work;Community Activity    Rehab Potential  Excellent    PT Frequency  2x / week    PT Duration  6 weeks    PT Treatment/Interventions  ADLs/Self Care Home Management;Therapeutic exercise;Neuromuscular re-education;Patient/family education;Manual lymph drainage;Passive range of motion    PT Next Visit Plan  scapular series assess/finish strength ABC , progress L shoulder/scapular strengthening,  assess compression sleeve and STM. See pt autonomy with self MLD.    PT Home Exercise Plan  Access Code: [email or web address], strength ABC    Consulted and Agree with Plan of Care  Patient       Patient will benefit from skilled therapeutic intervention in order to improve the following deficits and impairments:  Decreased knowledge of use of DME, Decreased range of motion, Pain, Increased edema  Visit Diagnosis: Malignant neoplasm of upper-outer quadrant of left breast in female, estrogen receptor positive ([facility])  Stiffness of left shoulder, not elsewhere classified  Postmastectomy lymphedema  Abnormal posture  Acquired absence of left breast     Problem List Patient Active Problem List   Diagnosis Date Noted  . Genetic testing 08/20/2019  . Family history of cancer   . S/P breast reconstruction 05/03/2019  . Port-A-Cath in place 11/22/2018  . 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 Malignant neoplasm of upper-outer quadrant of left breast in female, estrogen receptor positive ([place]) 10/10/2018    Ander Purpura, PT 08/21/2019, [place], [place], [address] Phone: [phone]   Fax:  [phone]  Name: [name] MRN: [id number] Date of Birth: 09/07/1970

## 2019-08-23 ENCOUNTER — Other Ambulatory Visit: Payer: Self-pay

## 2019-08-23 ENCOUNTER — Ambulatory Visit: Payer: 59

## 2019-08-23 DIAGNOSIS — R293 Abnormal posture: Secondary | ICD-10-CM

## 2019-08-23 DIAGNOSIS — Z9012 Acquired absence of left breast and nipple: Secondary | ICD-10-CM

## 2019-08-23 DIAGNOSIS — I972 Postmastectomy lymphedema syndrome: Secondary | ICD-10-CM

## 2019-08-23 DIAGNOSIS — M25612 Stiffness of left shoulder, not elsewhere classified: Secondary | ICD-10-CM

## 2019-08-23 DIAGNOSIS — C50412 Malignant neoplasm of upper-outer quadrant of left female breast: Secondary | ICD-10-CM

## 2019-08-23 NOTE — Therapy (Signed)
[place], [place], [address] Phone: [phone]   Fax:  [phone]  Physical Therapy Discharge Note  Patient Details  Name: [name] MRN: [id number] Date of Birth: 08-10-1970 Referring Provider (PT): Shona Simpson PA   Encounter Date: 08/23/2019  PT End of Session - 08/23/19 0807    Visit Number  7    Number of Visits  13    Date for PT Re-Evaluation  09/21/19    PT Start Time  0806    PT Stop Time  0859    PT Time Calculation (min)  53 min    Activity Tolerance  Patient tolerated treatment well    Behavior During Therapy  [facility] for tasks assessed/performed       Past Medical History:  Diagnosis Date  . Cancer ([place]) 10/18/2018   left breast cancer  . Family history of cancer     Past Surgical History:  Procedure Laterality Date  . BREAST RECONSTRUCTION WITH PLACEMENT OF TISSUE EXPANDER AND FLEX HD (ACELLULAR HYDRATED DERMIS) Left 10/18/2018   Procedure: IMMEDIATEVBREAST RECONSTRUCTION WITH PLACEMENT OF TISSUE EXPANDER AND FLEX HD (ACELLULAR HYDRATED DERMIS);  Surgeon: Wallace Going, DO;  Location: [place];  Service: Plastics;  Laterality: Left;  Marland Kitchen MASTECTOMY    . MASTECTOMY W/ SENTINEL NODE BIOPSY Left 10/18/2018   Procedure: LEFT MASTECTOMY WITH SENTINEL LYMPH NODE BIOPSY;  Surgeon: Jovita Kussmaul, MD;  Location: [facility];  Service: General;  Laterality: Left;  . PORT-A-CATH REMOVAL Right 04/12/2019   Procedure: REMOVAL PORT-A-CATH;  Surgeon: Wallace Going, DO;  Location: [facility];  Service: Plastics;  Laterality: Right;  . PORTACATH PLACEMENT Right 11/02/2018   Procedure: INSERTION PORT-A-CATH WITH ULTRASOUND;  Surgeon: Jovita Kussmaul, MD;  Location: [facility];  Service: General;  Laterality: Right;  . REMOVAL OF TISSUE EXPANDER AND PLACEMENT OF IMPLANT Left 04/12/2019   Procedure: REMOVAL OF TISSUE  EXPANDER AND PLACEMENT OF IMPLANT;  Surgeon: Wallace Going, DO;  Location: [facility];  Service: Plastics;  Laterality: Left;  90 min, please    There were no vitals filed for this visit.  Subjective Assessment - 08/23/19 0808    Subjective  Pt reports that her arm is feeling good and that she is able to use it.    Pertinent History  left mastectomy with SLNB (5 of 9 positive) by Dr. Marlou Starks followed by immediate reconstruction with tissue expander by Dr. [name] on 10/18/2018.    Patient Stated Goals  I want to reduce the swellingin my arm and increase flexibility.    Currently in Pain?  No/denies            LYMPHEDEMA/ONCOLOGY QUESTIONNAIRE - 08/23/19 0812      Left Upper Extremity Lymphedema   15 cm Proximal to Olecranon Process  26.4 cm    10 cm Proximal to Olecranon Process  26.3 cm    Olecranon Process  22 cm    15 cm Proximal to Ulnar Styloid Process  22.4 cm    10 cm Proximal to Ulnar Styloid Process  20.1 cm    Just Proximal to Ulnar Styloid Process  14.5 cm    Across Hand at [employer]  17.3 cm    At [place] of 2nd Digit  5.7 cm           Outpatient Rehab from 08/03/2019 in [facility]  Lymphedema Life Impact  Scale Total Score  13.24 %           [facility] Adult PT Treatment/Exercise - 08/23/19 0001      Exercises   Exercises  Other Exercises    Other Exercises   Strength after breast cancer final half from page 11. Lower trunk rotation stretch 1x 15 s Bil, Heel tap w/pelvic stabilization instead of crunch with provided handout, supine chest press with 3#, glute bridge 10x, bird dog 10x alt w/cueing for abdominal contraction, bent over row and shoulder extension 2# 10x each LUE, dead lift 3# 10x modified to raised surface, heel raises 3# bil UE 20x w/glute squeeze, bicep curl 3# BIl 10x, standing shoulder flexion, V/T 10x each 2# wgt, step up without weight one leg hold for 1-2 seconds 10x Bil. VC and tactile  cueing throughout for correct movement and modifications as needed.              PT Education - 08/23/19 0903    Education Details  Pt will continue wearing her sleeve for one more week then wear it when she works out, its hot outside or she does repetitive movements. She will continue with exercises until she finds something else that she is able to stay active doing. she will return to physical therapy if she has any heaviness, aching, pain or edema that she is not able to manage at home.    Person(s) Educated  Patient    Methods  Explanation;Demonstration;Tactile cues;Verbal cues;Handout    Comprehension  Verbalized understanding;Returned demonstration       PT Short Term Goals - 08/23/19 6213      PT SHORT TERM GOAL #1   Title  Pt will be independent with HEP and self MLD to promote autonomy of care within 3 weeks.    Baseline  Pt reports that she is performing exercises every day and MLD every other day because she doesn't do it on the days she comes to physical therapy    Status  Achieved      PT SHORT TERM GOAL #2   Title  Patient to be properly fitted with compression garment to wear on daily basis.    Baseline  pt has a compression sleeve that she wears every day.    Status  Achieved        PT Long Term Goals - 08/23/19 0809      PT LONG TERM GOAL #1   Title  Pt will improve L shoulder flexion and abduction to 150 degrees within 6 weeks to demonstrate improve functional mobility    Baseline  L shoulder flexion 155, L shoulder abdct 135    Status  Partially Met      PT LONG TERM GOAL #2   Title  Patient will have reduction of L brachium limb girth by 2 cm within 6 weeks to demonstrate decreased risk for swelling, infection and immobility.    Baseline  Pt has decreased by 2.4 cm in the L brachium.    Status  Achieved      PT LONG TERM GOAL #3   Title  Pt will report 50% improvement in pain/mobility within 6 weeks in order to demonstrate an improved quality of  life.    Baseline  Pt reports 85% improvement in pain and mobility since her initial evaluation and 0/10 pain.    Status  Achieved            Plan - 08/23/19 0807    Clinical Impression Statement  Pt has met all of her goals except for abduction of the L shoulder which has significantly improvement (~50 degrees improvement) but not yet reached 150 degrees of abduction for functional range. Pt was able to finish the strength after breast cancer exercises today with occasional VC and tactile cueing. Modifications were provided as needed. Pt will be discharged at this time for home management. She will continue to wear her sleeve for 1 more week and perform MLD. She will return to physical therapy if she notices any pain, aching, heaviness or edema that she is unable to manage at home.    Examination-Activity Limitations  Lift;Reach Overhead    Examination-Participation Restrictions  Yard Work;Community Activity    Rehab Potential  Excellent    PT Frequency  2x / week    PT Duration  6 weeks    PT Treatment/Interventions  ADLs/Self Care Home Management;Therapeutic exercise;Neuromuscular re-education;Patient/family education;Manual lymph drainage;Passive range of motion    PT Next Visit Plan  Discharge this session.    PT Home Exercise Plan  Access Code: [email or web address], strength ABC    Consulted and Agree with Plan of Care  Patient       Patient will benefit from skilled therapeutic intervention in order to improve the following deficits and impairments:  Decreased knowledge of use of DME, Decreased range of motion, Pain, Increased edema  Visit Diagnosis: Malignant neoplasm of upper-outer quadrant of left breast in female, estrogen receptor positive (HCC)  Stiffness of left shoulder, not elsewhere classified  Postmastectomy lymphedema  Abnormal posture  Acquired absence of left breast     Problem List Patient Active Problem List   Diagnosis Date Noted  . Genetic testing 08/20/2019   . Family history of cancer   . S/P breast reconstruction 05/03/2019  . Port-A-Cath in place 11/22/2018  . 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 Malignant neoplasm of upper-outer quadrant of left breast in female, estrogen receptor positive ([place]) 10/10/2018   PHYSICAL THERAPY DISCHARGE SUMMARY  Plan: Patient agrees to discharge.  Patient goals were partially met. Patient is being discharged due to being pleased with the current functional level.  ?????       Ander Purpura, PT 08/23/2019, 9:04 AM  [place] [place], [place], [address] Phone: [phone]   Fax:  [phone]  Name: Glena Pharris MRN: [id number] Date of Birth: [birth year]

## 2019-09-03 ENCOUNTER — Ambulatory Visit: Payer: 59

## 2019-09-04 NOTE — Progress Notes (Signed)
  Radiation Oncology         [phone] ________________________________  Name: [name] MRN: [id number]  Date: 06/28/2019  DOB: [birth year]  End of Treatment Note  Diagnosis:   left-sided breast cancer     Indication for treatment:  Curative       Radiation treatment dates:   05/21/19 - 06/28/19  Site/dose:   The patient initially received a dose of 50.4 Gy in 28 fractions to the chest wall and supraclavicular region. This was delivered using a 3-D conformal, 4 field technique. The patient then received a boost to the mastectomy scar. This delivered an additional 10 Gy in 5 fractions using an en face electron field. The total dose was 60.4 Gy.  Narrative: The patient tolerated radiation treatment relatively well.   The patient had some expected skin irritation as she progressed during treatment. Moist desquamation was not present at the end of treatment.  Plan: The patient has completed radiation treatment. The patient will return to radiation oncology clinic for routine followup in one month. I advised the patient to call or return sooner if they have any questions or concerns related to their recovery or treatment. ________________________________  Jodelle Gross, M.D., Ph.D.

## 2019-10-05 ENCOUNTER — Telehealth: Payer: Self-pay | Admitting: *Deleted

## 2019-10-05 NOTE — Telephone Encounter (Signed)
This RN spoke with pt per her call stating she had bilateral leg cramps during the night 10/03/2019 - she called on call nurse and was advised to hold the tamoxifen.  She forgot and took the tamoxifen Thursday but did not have cramps pm yesterday.  Today she is calling the office to follow up on what she needs to do - she is scheduled for follow up in 1 week.  This RN discussed above- with recommendations for pt to start 1/2 dose magnesium supplement as well as use of tonic water ( per quinine component ) .  This RN also recommended for her to resume the tamoxifen since she took it yesterday after the cramps but did not have any recurrent issues.  [name] verbalized understanding.

## 2019-10-10 NOTE — Progress Notes (Signed)
[facility]  [phone] [phone]    ID: [name] DOB: 1971/07/15  MR#: [id number]  ZMO#:[id number]  Patient Care Team: Orpah Melter, MD as PCP - General (Family Medicine) Mauro Kaufmann, RN as Oncology Nurse Navigator Rockwell Germany, RN as Oncology Nurse Navigator Jovita Kussmaul, MD as Consulting Physician (General Surgery) Terie Lear, Virgie Dad, MD as Consulting Physician (Oncology) Kyung Rudd, MD as Consulting Physician (Radiation Oncology) Aloha Gell, MD as Consulting Physician (Obstetrics and Gynecology) [name], Loel Lofty, DO as Attending Physician (Plastic Surgery) Chauncey Cruel, MD OTHER MD:   CHIEF COMPLAINT: Estrogen receptor positive breast cancer  CURRENT TREATMENT: Tamoxifen   INTERVAL HISTORY: [name] returns today for follow-up of her estrogen receptor positive breast cancer.  Since her last visit, her genetics testing results returned and was negative with a variant of uncertain significance in [place].  She was started on tamoxifen on 08/16/2019.  She is tolerating this generally well, with hot flashes only at night, but they do wake her up perhaps twice every night.  She also had some leg cramps.  We are treating that currently with magnesium.  That seems to be getting better already.  She has a very minimal wetness.  She is finding the Estring helpful.   REVIEW OF SYSTEMS: [name]'s hair has come back quite curly.  She is exercising chiefly by walking.  She did 3 weeks of physical therapy which was very helpful and she is still doing stretches.  She finds that her left upper extremity did swell.  She has a compression sleeve which she uses intermittently.  She received both her Pfizer shots and within another week will be beyond the 2-week of optimal antibody production.  She is working remotely.  Sometimes when she wakes up in the morning she finds that her left forearm area is numb.  She cannot quite  associated with a particular position she had been sleeping in.   HISTORY OF CURRENT ILLNESS: From the original intake note:  "[name]" presented with left nipple retraction for 1 week with retroareolar mass in the upper outer quadrant. She underwent bilateral diagnostic mammography with CAD and left breast ultrasonography at [facility] on 09/25/2018 showing: 7 mm oval duct in the left breast; no significant abnormalities in the left axilla. She also underwent additional imaging with left breast digital diagnostic mammogram on 10/02/2018 showing: new 0.4 cm cluster of grouped heterogeneous calcifications in the left breast.   Accordingly on 10/02/2018 she proceeded to biopsy of the left breast area in question. The pathology [id number]) from this procedure showed: mammary carcinoma in situ at the 3 o'clock mass, with possible focal microinvasion; invasive and in situ mammary carcinoma at the 12 o'clock mass, immunostain for E-cadherin is negative in the tumor cells, consistent with a lobular phenotype. Prognostic indicators significant for: estrogen receptor, 90% positive, with moderate staining intensity and progesterone receptor, 100% positive, with strong staining intensity. Proliferation marker Ki67 at <1%. HER2 negative (1+).   The patient's subsequent history is as detailed above.    PAST MEDICAL HISTORY: Past Medical History:  Diagnosis Date  . Cancer ([place]) 10/18/2018   left breast cancer  . Family history of cancer     PAST SURGICAL HISTORY: Past Surgical History:  Procedure Laterality Date  . BREAST RECONSTRUCTION WITH PLACEMENT OF TISSUE EXPANDER AND FLEX HD (ACELLULAR HYDRATED DERMIS) Left 10/18/2018   Procedure: IMMEDIATEVBREAST RECONSTRUCTION WITH PLACEMENT OF TISSUE EXPANDER AND FLEX HD (ACELLULAR HYDRATED DERMIS);  Surgeon: Wallace Going,  DO;  Location: [facility];  Service: Plastics;  Laterality: Left;  Marland Kitchen MASTECTOMY    . MASTECTOMY W/ SENTINEL NODE BIOPSY Left  10/18/2018   Procedure: LEFT MASTECTOMY WITH SENTINEL LYMPH NODE BIOPSY;  Surgeon: Jovita Kussmaul, MD;  Location: [facility];  Service: General;  Laterality: Left;  . PORT-A-CATH REMOVAL Right 04/12/2019   Procedure: REMOVAL PORT-A-CATH;  Surgeon: Wallace Going, DO;  Location: [facility];  Service: Plastics;  Laterality: Right;  . PORTACATH PLACEMENT Right 11/02/2018   Procedure: INSERTION PORT-A-CATH WITH ULTRASOUND;  Surgeon: Jovita Kussmaul, MD;  Location: [facility];  Service: General;  Laterality: Right;  . REMOVAL OF TISSUE EXPANDER AND PLACEMENT OF IMPLANT Left 04/12/2019   Procedure: REMOVAL OF TISSUE EXPANDER AND PLACEMENT OF IMPLANT;  Surgeon: Wallace Going, DO;  Location: [facility];  Service: Plastics;  Laterality: Left;  90 min, please    FAMILY HISTORY Family History  Problem Relation Age of Onset  . Skin cancer Father        on back  . Skin cancer Paternal Grandmother        on nose  . Breast cancer Neg Hx   . Ovarian cancer Neg Hx    As of March 2020, patient's father is living and healthy at age 14. Patient's mother is also living and healthy at age 97. The patient denies a family hx of breast or ovarian cancer. She has 1 sister.   GYNECOLOGIC HISTORY:  Menarche: 49 years old Age at first live birth: 49 years old [name] P 2 LMP June 2020 Contraceptive: Mirena IUD has been replaced by a ParaGard IUD as of March 2020 She used birth control pills between ages 27 to 31 w/o event HRT n/a  Hysterectomy? no BSO? 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  She has a PhD in Probation officer.  Husband [name] is an Art gallery manager.  They married in 2018.  At home it is the 2 of them and 3 children. She has two children, [name] age 57 and [name] age 15. Husband [name] has one daughter, [name] age 25. ADVANCED DIRECTIVES: In the absence of any  documentation to the contrary her husband [name] is her HCPOA.   HEALTH MAINTENANCE: Social History   Tobacco Use  . Smoking status: Never Smoker  . Smokeless tobacco: Never Used  Substance Use Topics  . Alcohol use: Yes    Alcohol/week: 3.0 standard drinks    Types: 3 Standard drinks or equivalent per week    Comment: social  . Drug use: Never     Colonoscopy: never done  PAP: 08/2017  Bone density: never done   Allergies  Allergen Reactions  . Septra [Sulfamethoxazole-Trimethoprim]     Current Outpatient Medications  Medication Sig Dispense Refill  . estradiol (ESTRING) 2 MG vaginal ring Place 2 mg vaginally every 3 (three) months. follow package directions 1 each 12  . gabapentin (NEURONTIN) 300 MG capsule Take 1 capsule (300 mg total) by mouth at bedtime. 90 capsule 4  . tamoxifen (NOLVADEX) 20 MG tablet Take 1 tablet (20 mg total) by mouth daily. 90 tablet 12   No current facility-administered medications for this visit.    OBJECTIVE: Young white woman who appears stated age 50:   10/11/19 0814  BP: (!) 107/53  Pulse: 78  Temp: 97.8 F (36.6 C)  SpO2: 100%   Wt Readings  from Last 3 Encounters:  10/11/19 153 lb 8 oz (69.6 kg)  08/08/19 156 lb 4.8 oz (70.9 kg)  08/07/19 157 lb 6.4 oz (71.4 kg)   Body mass index is 24.78 kg/m.    ECOG FS:1 - Symptomatic but completely ambulatory  Sclerae unicteric, EOMs intact Wearing a mask No cervical or supraclavicular adenopathy Lungs no rales or rhonchi Heart regular rate and rhythm Abd soft, nontender, positive bowel sounds MSK no focal spinal tenderness, no upper extremity lymphedema Neuro: nonfocal, well oriented, appropriate affect Breasts: The right breast is unremarkable.  The left breast is status post mastectomy with silicone implant reconstruction.  The cosmetic result is good.  There is no evidence of residual recurrent disease.  Both axillae are benign.   LAB RESULTS:  CMP     Component Value  Date/Time   NA 142 10/11/2019 0751   K 4.3 10/11/2019 0751   CL 109 10/11/2019 0751   CO2 24 10/11/2019 0751   GLUCOSE 138 (H) 10/11/2019 0751   BUN 14 10/11/2019 0751   CREATININE 0.90 10/11/2019 0751   CALCIUM 8.7 (L) 10/11/2019 0751   PROT 6.3 (L) 10/11/2019 0751   ALBUMIN 3.6 10/11/2019 0751   AST 16 10/11/2019 0751   ALT 14 10/11/2019 0751   ALKPHOS 61 10/11/2019 0751   BILITOT 0.4 10/11/2019 0751   GFRNONAA >60 10/11/2019 0751   GFRAA >60 10/11/2019 0751    No results found for: TOTALPROTELP, ALBUMINELP, A1GS, A2GS, BETS, BETA2SER, GAMS, MSPIKE, SPEI  No results found for: KPAFRELGTCHN, LAMBDASER, KAPLAMBRATIO  Lab Results  Component Value Date   WBC 3.6 (L) 10/11/2019   NEUTROABS 2.1 10/11/2019   HGB 13.8 10/11/2019   HCT 42.9 10/11/2019   MCV 94.7 10/11/2019   PLT 246 10/11/2019    No results found for: LABCA2  No components found for: EMLJQG920  No results for input(s): INR in the last 168 hours.  No results found for: LABCA2  No results found for: FEO712  No results found for: RFX588  No results found for: TGP498  No results found for: CA2729  No components found for: HGQUANT  No results found for: CEA1 / No results found for: CEA1   No results found for: AFPTUMOR  No results found for: CHROMOGRNA  No results found for: HGBA, HGBA2QUANT, HGBFQUANT, HGBSQUAN (Hemoglobinopathy evaluation)   No results found for: LDH  No results found for: IRON, TIBC, IRONPCTSAT (Iron and TIBC)  No results found for: FERRITIN  Urinalysis    Component Value Date/Time   COLORURINE YELLOW 03/05/2019 0912   APPEARANCEUR CLEAR 03/05/2019 0912   LABSPEC 1.017 03/05/2019 0912   PHURINE 5.0 03/05/2019 0912   GLUCOSEU NEGATIVE 03/05/2019 0912   HGBUR NEGATIVE 03/05/2019 0912   BILIRUBINUR NEGATIVE 03/05/2019 0912   KETONESUR NEGATIVE 03/05/2019 0912   PROTEINUR NEGATIVE 03/05/2019 0912   UROBILINOGEN 0.2 01/27/2008 0235   NITRITE NEGATIVE 03/05/2019 0912     LEUKOCYTESUR NEGATIVE 03/05/2019 0912     STUDIES: No results found.   ELIGIBLE FOR AVAILABLE RESEARCH PROTOCOL: no   ASSESSMENT: 49 y.o. [facility] woman status post left breast upper outer quadrant biopsy 10/02/2018 for a clinical T2N0, stage IB invasive lobular breast cancer, grade not stated, estrogen and progesterone receptor positive, HER-2 not amplified, with an MIB-1 of less than 1%  (1) status post left mastectomy and sentinel lymph node sampling 10/18/2018 for a pT3 pN2, stage IIA invasive lobular breast cancer, grade 2, with close but negative margins  (a) 5 of  9 lymph nodes removed had micrometastatic deposits of tumor  (b) immediate expander placement  (c) definitive silicone implant placement 04/12/2019 with [place] Smooth Round High Profile Gel 225cc. Ref #[id number].  Serial Number [id number]  (2) chemotherapy consisting of doxorubicin and cyclophosphamide in dose dense fashion x4 started 11/22/2018, completed 01/02/2019, followed by weekly paclitaxel x12 starting 01/15/2019, completed 04/02/2019  (a) received 11 of 12 planned taxol cycles (last cycle d/c'd to accommodate surgery)  (3) adjuvant radiation 05/21/2019 - 07/05/2019  (a) left chest wall / 50.4 Gy in 28 fractions  (b) boost / 10 Gy in 5 fractions  (4) tamoxifen started 08/16/2019  (a) FSH/estradiol levels consistent with menopause 05/09/2019 and 08/01/2019  (5) genetics tive genetic testing 08/16/2019 through the [facility] Common Hereditary Cancers Panel found no deleterious mutations in APC, ATM, AXIN2, BARD1, BMPR1A, BRCA1, BRCA2, BRIP1, CDH1, CDKN2A (p14ARF), CDKN2A (p16INK4a), CKD4, CHEK2, CTNNA1, DICER1, EPCAM (Deletion/duplication testing only), GREM1 (promoter region deletion/duplication testing only), KIT, MEN1, MLH1, MSH2, MSH3, MSH6, MUTYH, NBN, NF1, NHTL1, PALB2, PDGFRA, PMS2, POLD1, POLE, PTEN, RAD50, RAD51C, RAD51D, RNF43, SDHB, SDHC, SDHD, SMAD4, SMARCA4. STK11, TP53, TSC1, TSC2, and VHL.  The  following genes were evaluated for sequence changes only: SDHA and HOXB13 c.251G>A variant only.  (a) VUS in [place] called c.1874A>G identified  PLAN: Kacey is now just about a year out from definitive surgery for her breast cancer with no evidence of disease recurrence.  This is very favorable.  She is tolerating tamoxifen well.  The cramps that she was experiencing are already fading.  If she does not have cramps in the next 2 or 3 weeks she can stop the magnesium.  In the meantime she is making a good job of hydrating herself and she is walking and stretching appropriately.  I encouraged her to use her left upper extremity compression sleeve as much as she can.  I reassured her that the numbness she feels sometimes in the morning in her left forearm is related to the scar tissue from her left axillary surgery and not from breast cancer recurrence.  She is due for mammography and I have put in the appropriate orders for that.  We are going to give gabapentin a try at bedtime.  I think that will help her sleep and have fewer hot flashes in the evening.  We discussed her being at risk for recurrence, although I do not expect any bad news.  At the 2-year mark we may want to consider doing an F-18 estradiol PET scan.  She will see me again in 6 months.  She knows to call for any other issue that may develop before then.  Total encounter time 25 minutes.*    Ninel Abdella, Virgie Dad, MD  10/11/19 8:54 AM Medical Oncology and Hematology [facility] [place], [place] [address] Tel. [phone]    Fax. [phone]   I, Wilburn Mylar, am acting as scribe for Dr. Virgie Dad. Tison Leibold.  I, Lurline Del MD, have reviewed the above documentation for accuracy and completeness, and I agree with the above.   *Total Encounter Time as defined by the Centers for Medicare and Medicaid Services includes, in addition to the face-to-face time of a patient visit (documented  in the note above) non-face-to-face time: obtaining and reviewing outside history, ordering and reviewing medications, tests or procedures, care coordination (communications with other health care professionals or caregivers) and documentation in the medical record.

## 2019-10-11 ENCOUNTER — Other Ambulatory Visit: Payer: Self-pay

## 2019-10-11 ENCOUNTER — Inpatient Hospital Stay: Payer: 59

## 2019-10-11 ENCOUNTER — Inpatient Hospital Stay: Payer: 59 | Attending: Oncology | Admitting: Oncology

## 2019-10-11 ENCOUNTER — Telehealth: Payer: Self-pay | Admitting: Oncology

## 2019-10-11 VITALS — BP 107/53 | HR 78 | Temp 97.8°F | Ht 66.0 in | Wt 153.5 lb

## 2019-10-11 DIAGNOSIS — C50412 Malignant neoplasm of upper-outer quadrant of left female breast: Secondary | ICD-10-CM | POA: Diagnosis present

## 2019-10-11 DIAGNOSIS — R252 Cramp and spasm: Secondary | ICD-10-CM | POA: Insufficient documentation

## 2019-10-11 DIAGNOSIS — N951 Menopausal and female climacteric states: Secondary | ICD-10-CM | POA: Insufficient documentation

## 2019-10-11 DIAGNOSIS — Z9012 Acquired absence of left breast and nipple: Secondary | ICD-10-CM | POA: Insufficient documentation

## 2019-10-11 DIAGNOSIS — Z170 Estrogen receptor positive status [ER+]: Secondary | ICD-10-CM | POA: Diagnosis not present

## 2019-10-11 LAB — CBC WITH DIFFERENTIAL (CANCER CENTER ONLY)
Abs Immature Granulocytes: 0.01 10*3/uL (ref 0.00–0.07)
Basophils Absolute: 0 10*3/uL (ref 0.0–0.1)
Basophils Relative: 1 %
Eosinophils Absolute: 0.2 10*3/uL (ref 0.0–0.5)
Eosinophils Relative: 4 %
HCT: 42.9 % (ref 36.0–46.0)
Hemoglobin: 13.8 g/dL (ref 12.0–15.0)
Immature Granulocytes: 0 %
Lymphocytes Relative: 28 %
Lymphs Abs: 1 10*3/uL (ref 0.7–4.0)
MCH: 30.5 pg (ref 26.0–34.0)
MCHC: 32.2 g/dL (ref 30.0–36.0)
MCV: 94.7 fL (ref 80.0–100.0)
Monocytes Absolute: 0.3 10*3/uL (ref 0.1–1.0)
Monocytes Relative: 9 %
Neutro Abs: 2.1 10*3/uL (ref 1.7–7.7)
Neutrophils Relative %: 58 %
Platelet Count: 246 10*3/uL (ref 150–400)
RBC: 4.53 MIL/uL (ref 3.87–5.11)
RDW: 12.6 % (ref 11.5–15.5)
WBC Count: 3.6 10*3/uL — ABNORMAL LOW (ref 4.0–10.5)
nRBC: 0 % (ref 0.0–0.2)

## 2019-10-11 LAB — CMP (CANCER CENTER ONLY)
ALT: 14 U/L (ref 0–44)
AST: 16 U/L (ref 15–41)
Albumin: 3.6 g/dL (ref 3.5–5.0)
Alkaline Phosphatase: 61 U/L (ref 38–126)
Anion gap: 9 (ref 5–15)
BUN: 14 mg/dL (ref 6–20)
CO2: 24 mmol/L (ref 22–32)
Calcium: 8.7 mg/dL — ABNORMAL LOW (ref 8.9–10.3)
Chloride: 109 mmol/L (ref 98–111)
Creatinine: 0.9 mg/dL (ref 0.44–1.00)
GFR, Est AFR Am: >60 mL/min (ref >60)
GFR, Estimated: >60 mL/min (ref >60)
Glucose, Bld: 138 mg/dL — ABNORMAL HIGH (ref 70–99)
Potassium: 4.3 mmol/L (ref 3.5–5.1)
Sodium: 142 mmol/L (ref 135–145)
Total Bilirubin: 0.4 mg/dL (ref 0.3–1.2)
Total Protein: 6.3 g/dL — ABNORMAL LOW (ref 6.5–8.1)

## 2019-10-11 MED ORDER — GABAPENTIN 300 MG PO CAPS: 300.0000 mg | ORAL_CAPSULE | Freq: Every day | ORAL | 4 refills | Status: DC

## 2019-10-11 MED ORDER — TAMOXIFEN CITRATE 20 MG PO TABS: 20.0000 mg | ORAL_TABLET | Freq: Every day | ORAL | 12 refills | Status: AC

## 2019-10-11 NOTE — Telephone Encounter (Signed)
Scheduled appts per 3/25 los. Left voicemail with appt details. Faxed order to [facility].

## 2019-10-12 LAB — FOLLICLE STIMULATING HORMONE: FSH: 50.2 m[IU]/mL

## 2019-10-15 ENCOUNTER — Other Ambulatory Visit: Payer: Self-pay | Admitting: Oncology

## 2019-10-16 LAB — ESTRADIOL, ULTRA SENS: Estradiol, Sensitive: 8 pg/mL

## 2019-11-01 ENCOUNTER — Encounter: Payer: Self-pay | Admitting: Oncology

## 2019-11-06 ENCOUNTER — Encounter: Payer: Self-pay | Admitting: Plastic Surgery

## 2019-11-06 ENCOUNTER — Inpatient Hospital Stay: Payer: 59 | Attending: Oncology | Admitting: Adult Health

## 2019-11-06 ENCOUNTER — Encounter: Payer: Self-pay | Admitting: Adult Health

## 2019-11-06 ENCOUNTER — Other Ambulatory Visit: Payer: Self-pay

## 2019-11-06 ENCOUNTER — Ambulatory Visit (INDEPENDENT_AMBULATORY_CARE_PROVIDER_SITE_OTHER): Payer: 59 | Admitting: Plastic Surgery

## 2019-11-06 VITALS — BP 120/78 | HR 80 | Temp 96.7°F | Ht 66.0 in | Wt 152.0 lb

## 2019-11-06 VITALS — BP 116/74 | HR 76 | Temp 98.9°F | Resp 18 | Ht 66.0 in | Wt 152.4 lb

## 2019-11-06 DIAGNOSIS — C50412 Malignant neoplasm of upper-outer quadrant of left female breast: Secondary | ICD-10-CM | POA: Insufficient documentation

## 2019-11-06 DIAGNOSIS — G629 Polyneuropathy, unspecified: Secondary | ICD-10-CM | POA: Diagnosis not present

## 2019-11-06 DIAGNOSIS — Z9012 Acquired absence of left breast and nipple: Secondary | ICD-10-CM | POA: Diagnosis not present

## 2019-11-06 DIAGNOSIS — Z9221 Personal history of antineoplastic chemotherapy: Secondary | ICD-10-CM | POA: Diagnosis not present

## 2019-11-06 DIAGNOSIS — Z923 Personal history of irradiation: Secondary | ICD-10-CM | POA: Insufficient documentation

## 2019-11-06 DIAGNOSIS — Z170 Estrogen receptor positive status [ER+]: Secondary | ICD-10-CM

## 2019-11-06 DIAGNOSIS — Z9889 Other specified postprocedural states: Secondary | ICD-10-CM

## 2019-11-06 NOTE — Progress Notes (Signed)
   Subjective:    Patient ID: [name], female    DOB: [birth year], 49 y.o.   MRN: [id number]  The patient is a 49 year old female here for follow-up on her left breast reconstruction.  She underwent a mastectomy with expander placement followed by an implant.  She has finished her radiation the area has healed very nicely from a skin standpoint.  There is no skin breakdown and incision is intact.  The pocket is a little bit tight.  She has not started massaging yet. She is interested in nipple areola tattoo.   Review of Systems  Constitutional: Negative.   HENT: Negative.   Eyes: Negative.   Respiratory: Negative.   Cardiovascular: Negative.   Gastrointestinal: Negative.   Genitourinary: Negative.   Musculoskeletal: Negative.   Hematological: Negative.   Psychiatric/Behavioral: Negative.        Objective:   Physical Exam Vitals and nursing note reviewed.  Constitutional:      Appearance: Normal appearance.  HENT:     Head: Normocephalic and atraumatic.  Cardiovascular:     Rate and Rhythm: Normal rate.     Pulses: Normal pulses.  Pulmonary:     Effort: Pulmonary effort is normal.  Neurological:     General: No focal deficit present.     Mental Status: She is alert and oriented to person, place, and time.  Psychiatric:        Mood and Affect: Mood normal.        Behavior: Behavior normal.        Assessment & Plan:     ICD-10-CM   1. S/P breast reconstruction  Z98.890    Recommend starting massage in the down and medial direction of the left implant.  Continue with sports bra for another month.  Then can go with a regular bra without a wire.  I would like to see her back in 6 months.  At that time we can talk about the nipple areola reconstruction with tattoo placement.  This will be the safest route for her.  I do not want to do anything during this post radiation period to aggravate the skin.  Patient understands and agrees with the plan. Pictures were  obtained of the patient and placed in the chart with the patient's or guardian's permission.

## 2019-11-07 ENCOUNTER — Telehealth: Payer: Self-pay | Admitting: Adult Health

## 2019-11-07 NOTE — Telephone Encounter (Signed)
No 4/20 los. No changes made to pt's schedule.

## 2019-11-08 IMAGING — MR MR BREAST BILAT WO/W CM
11 of 14 series · 29 of 48 positions shown · IV contrast (7 ml gadavist)
Comparison: Outside diagnostic mammogram and ultrasound dated
09/25/2018.

CLINICAL DATA: LEFT breast biopsy 1 week ago positive for breast
cancer. Nipple retraction.
TECHNIQUE: Multiplanar, multisequence MR images of both breasts were obtained
prior to and following the intravenous administration of 7 ml of
Gadavist

[Series 2: t2_tirm_tra ipat (a-p) · axial · 3.0mm · 0.61mm/px · 1 of 55 slices shown]
[im 1/55]
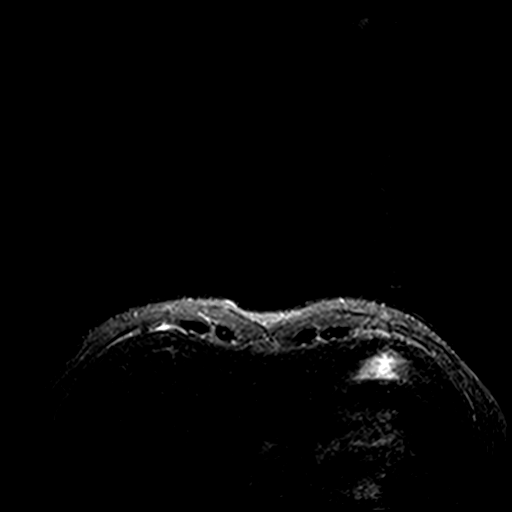

[Series 3: fl3d pre-cm no · axial · non-contrast · 1.2mm · 0.81mm/px · z∈[-55,+117]mm · 3 of 144 slices shown]
[im 1/144]
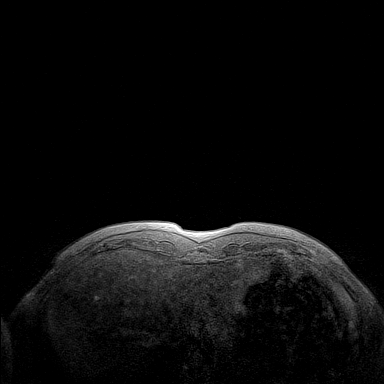
[im 72/144]
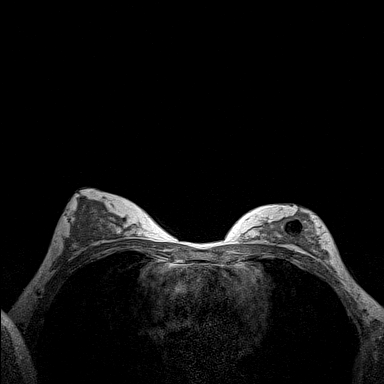
[im 144/144]
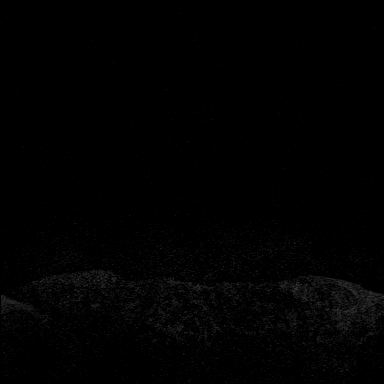

[Series 4: fl3d pre-cm · axial · non-contrast · 1.2mm · 0.81mm/px · z∈[-55,+117]mm · 3 of 144 slices shown]
[im 1/144]
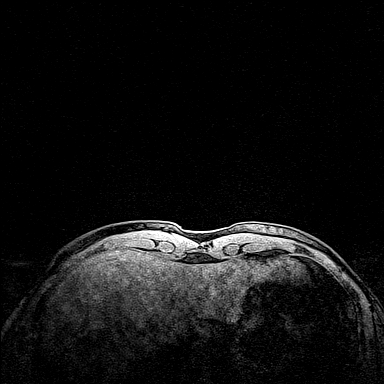
[im 72/144]
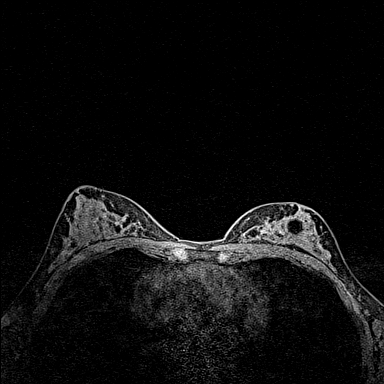
[im 144/144]
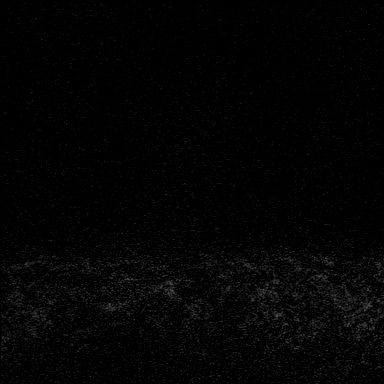

[Series 5: fl3d post-cm 20 · axial · 1.2mm · 0.81mm/px · z∈[-55,+117]mm · 3 of 144 slices shown (1 of 3)]
[im 1/144]
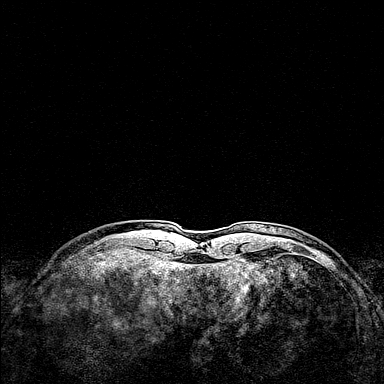
[im 72/144]
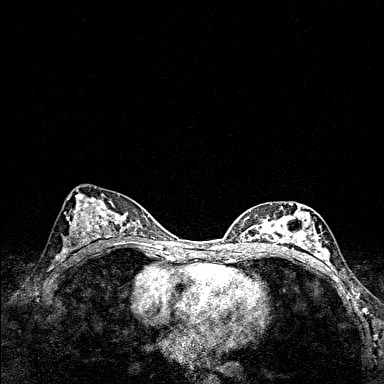
[im 144/144]
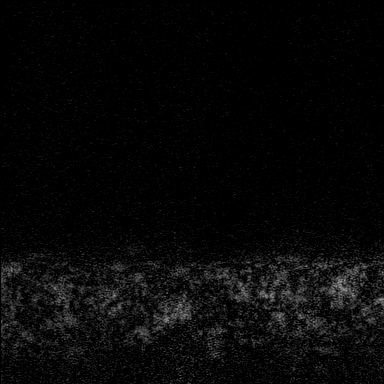

[Series 6: fl3d post-cm 20 · axial · 1.2mm · 0.81mm/px · z∈[-55,+117]mm · 3 of 144 slices shown (2 of 3)]
[im 1/144]
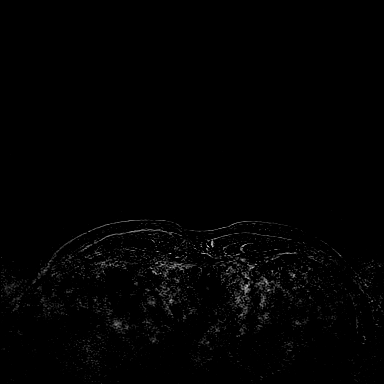
[im 72/144]
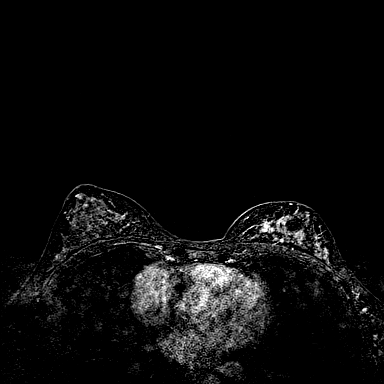
[im 144/144]
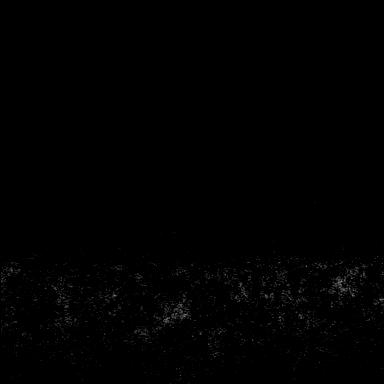

[Series 7: fl3d post-cm 20 · axial · 172.8mm · 0.81mm/px · 1 of 1 slices shown (3 of 3)]
[im 1/1]
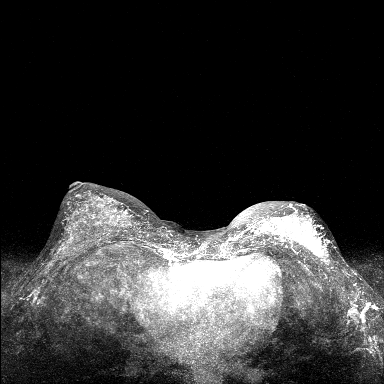

[Series 8: fl3d post-cm 3min · axial · 1.2mm · 0.81mm/px · z∈[-55,+117]mm · 4 of 144 slices shown]
[im 1/144]
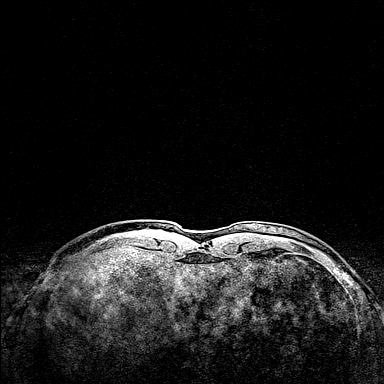
[im 48/144]
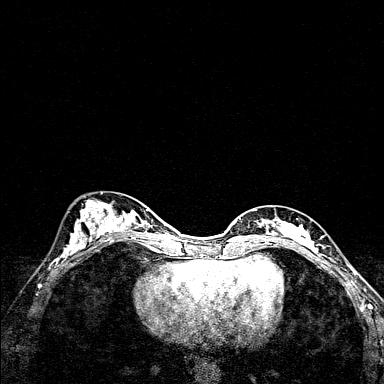
[im 96/144]
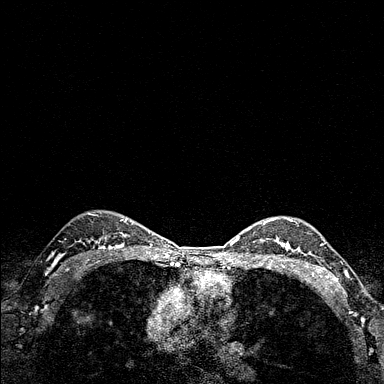
[im 144/144]
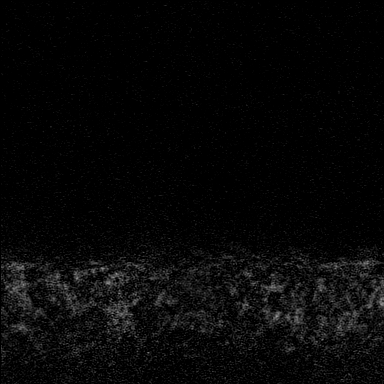

[Series 9: fl3d post-cm 3min_sub · axial · 1.2mm · 0.81mm/px · z∈[-55,+117]mm · 4 of 144 slices shown]
[im 1/144]
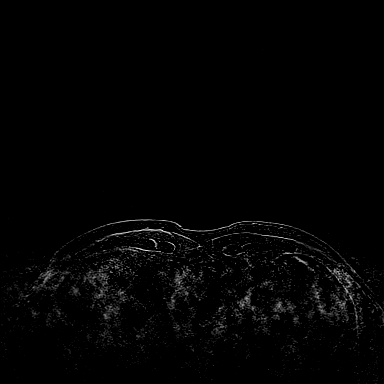
[im 48/144]
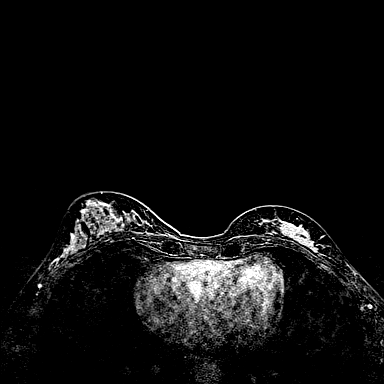
[im 96/144]
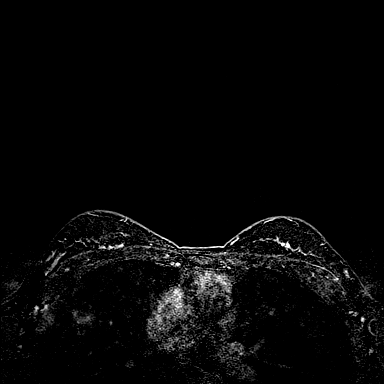
[im 144/144]
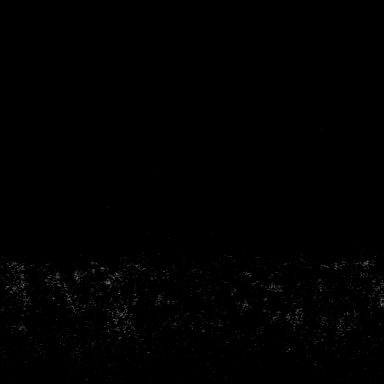

[Series 10: fl3d post-cm 3min_sub_mip_tra · axial · 172.8mm · 0.81mm/px · 1 of 1 slices shown]
[im 1/1]
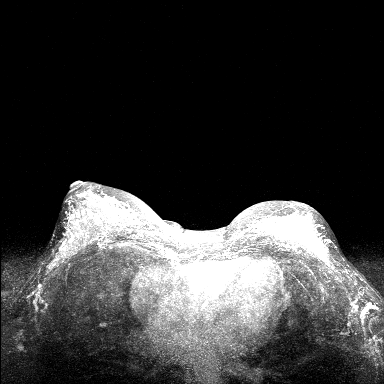

[Series 11: fl3d post-cm 5min · axial · 1.2mm · 0.81mm/px · z∈[-55,+117]mm · 4 of 144 slices shown]
[im 1/144]
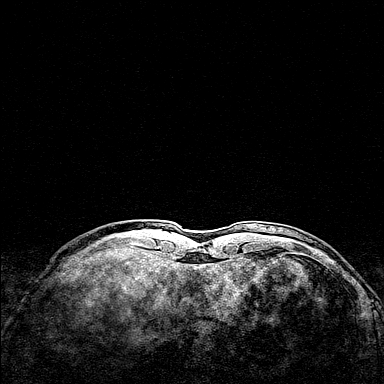
[im 48/144]
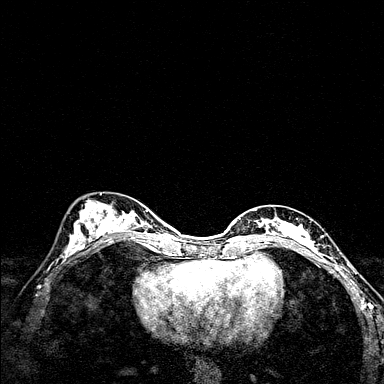
[im 96/144]
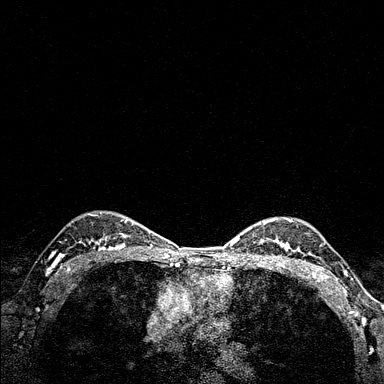
[im 144/144]
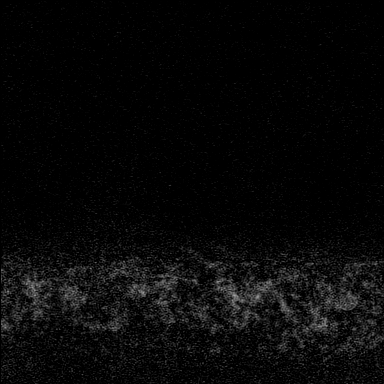

[Series 12: fl3d post-cm 5min_sub · axial · 1.2mm · 0.81mm/px · z∈[-55,+1]mm · 2 of 144 slices shown]
[im 1/144]
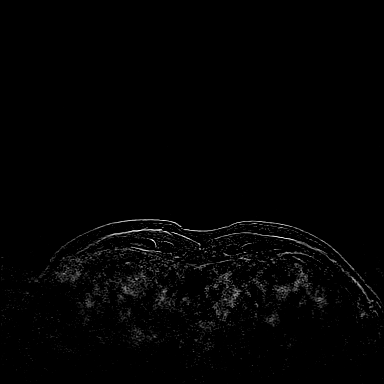
[im 48/144]
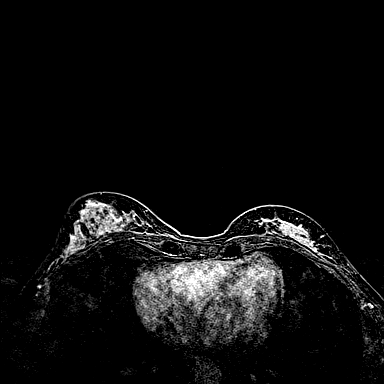

[29 of 48 positions shown; findings below may reference images not displayed]

Outside diagnostic mammogram and ultrasound report dated 10/02/2018
described a 4 mm cluster of grouped heterogeneous calcifications in
the LEFT breast at the 12 o'clock axis.

Pathology report dated 10/02/2018 describes mammary carcinoma in
situ in the LEFT breast at the 3 o'clock axis, 2-3 cm from nipple.

Additional pathology report dated 10/02/2018 describes invasive and
in situ mammary carcinoma in the retroareolar LEFT breast.

LABS:  Not applicable

EXAM:
BILATERAL BREAST MRI WITH AND WITHOUT CONTRAST
Three-dimensional MR images were rendered by post-processing of the
original MR data on an independent workstation. The
three-dimensional MR images were interpreted, and findings are
reported in the following complete MRI report for this study. Three
dimensional images were evaluated at the independent [employer]
workstation
Outside diagnostic mammograms dated 10/02/2018 and
ultrasound-guided biopsy dated 10/02/2018.
FINDINGS: Breast composition: c. Heterogeneous fibroglandular tissue.

Background parenchymal enhancement: Moderate.

Right breast: No mass or abnormal enhancement.

Left breast: Biopsy clip artifact is present within the retroareolar
LEFT breast. There is additional biopsy clip artifact within the
upper-outer quadrant of the LEFT breast. These clips correspond to
the 2 sites of known biopsy-proven carcinoma.

There is extensive confluent highly suspicious non-mass enhancement
extending from the retroareolar biopsy clip artifact into the lower
LEFT breast, centered within the lower outer quadrant but extending
slightly into the lower inner quadrant, with mixed enhancement
kinetics including suspicious washout.

There is additional diffuse, although somewhat less confluent,
non-mass enhancement extending from the retroareolar clip towards
the additional biopsy clip within the upper-outer quadrant,
involving the upper outer quadrant and upper inner quadrant but most
prominent at the 12 o'clock axis, with predominantly plateau
kinetics throughout but with suspicious washout kinetics at the 12
o'clock axis.

Lymph nodes: No abnormal appearing lymph nodes.

Ancillary findings:  None.
IMPRESSION: 1. Biopsy clip artifacts within the retroareolar LEFT breast and
within the upper-outer quadrant of the LEFT breast, corresponding to
the 2 sites of known biopsy-proven carcinoma.
2. Extensive highly suspicious non-mass enhancement throughout a
significant portion of the LEFT breast, most prominently seen within
the retroareolar LEFT breast extending into the lower outer quadrant
(series 6, image 97) and slightly into the lower inner quadrant,
with highly suspicious enhancement kinetics. If breast conservation
surgery is considered, would consider additional MRI-guided biopsy
of the highly suspicious non-mass enhancement within the lower outer
quadrant of the LEFT breast which almost certainly represents
additional multicentric disease.
3. Additional suspicious non-mass enhancement within the upper LEFT
breast, involving the upper outer quadrant and upper inner quadrant
but most prominent at the 12 o'clock axis (series 6, image 63) with
associated suspicious washout kinetics. Again, if breast
conservation surgery is considered, would consider additional
MRI-guided biopsy for this suspicious non-mass enhancement within
the upper LEFT breast at the 12 o'clock axis region.
4. No evidence of malignancy within the RIGHT breast.
5. No evidence of metastatic lymphadenopathy.

RECOMMENDATION:
1. If breast conservation surgery is considered for the LEFT breast,
recommend additional MRI-guided biopsies of the highly suspicious
non-mass enhancement within the lower outer quadrant of the LEFT
breast and the suspicious non-mass enhancement at the 12 o'clock
axis of the upper LEFT breast.
2. Otherwise, per current treatment plan for patient's known
biopsy-proven LEFT breast carcinomas.

BI-RADS CATEGORY  5: Highly suggestive of malignancy.

## 2019-11-09 NOTE — Progress Notes (Signed)
SURVIVORSHIP VISIT: .   BRIEF ONCOLOGIC HISTORY:  Oncology History  Malignant neoplasm of upper-outer quadrant of left breast in female, estrogen receptor positive ([place])  10/10/2018 Initial Diagnosis   Malignant neoplasm of upper-outer quadrant of left breast in female, estrogen receptor positive ([place])   10/18/2018 Surgery   Left mastectomy Marlou Starks) [phone]): Invasive Lobular Carcinoma, 7.8 cm, grade 2, negative margins. ER+, PR+, HER2-, with an MIB-1 of less than 1%. 5/9 lymph nodes were positive for carcinoma.   11/22/2018 - 04/02/2019 Chemotherapy   DOXOrubicin (ADRIAMYCIN) chemo injection 104 mg, 60 mg/m2 = 104 mg, Intravenous,  Once, 4 of 4 cycles.. Administration: 104 mg (11/22/2018), 104 mg (12/05/2018), 104 mg (12/19/2018), 104 mg (01/02/2019)  palonosetron (ALOXI) injection 0.25 mg, 0.25 mg, Intravenous,  Once, 4 of 4 cycles. Administration: 0.25 mg (11/22/2018), 0.25 mg (12/05/2018), 0.25 mg (12/19/2018), 0.25 mg (01/02/2019)  pegfilgrastim (NEULASTA ONPRO KIT) injection 6 mg, 6 mg, Subcutaneous, Once, 4 of 4 cycles. Administration: 6 mg (11/22/2018), 6 mg (12/05/2018), 6 mg (12/19/2018), 6 mg (01/02/2019)  cyclophosphamide (CYTOXAN) 1,040 mg in sodium chloride 0.9 % 250 mL chemo infusion, 600 mg/m2 = 1,040 mg, Intravenous,  Once, 4 of 4 cycles. Administration: 1,040 mg (11/22/2018), 1,040 mg (12/05/2018), 1,040 mg (12/19/2018), 1,040 mg (01/02/2019)  PACLitaxel (TAXOL) 138 mg in sodium chloride 0.9 % 250 mL chemo infusion (</= 13m/m2), 80 mg/m2 = 138 mg, Intravenous,  Once, 11 of 11 cycles. Administration: 138 mg (01/15/2019), 138 mg (01/22/2019), 138 mg (01/29/2019), 138 mg (02/05/2019), 138 mg (02/12/2019), 138 mg (02/19/2019), 138 mg (02/26/2019), 138 mg (03/05/2019), 138 mg (03/19/2019), 138 mg (03/27/2019), 138 mg (04/02/2019)  fosaprepitant (EMEND) 150 mg  dexamethasone (DECADRON) 12 mg in sodium chloride 0.9 % 145 mL IVPB, , Intravenous,  Once, 4 of 4 cycles. Administration:  (11/22/2018),  (12/05/2018),  (12/19/2018),   (01/02/2019)     05/21/2019 - 06/28/2019 Radiation Therapy   The patient initially received a dose of 50.4 Gy in 28 fractions to the chest wall and supraclavicular region. This was delivered using a 3-D conformal, 4 field technique. The patient then received a boost to the mastectomy scar. This delivered an additional 10 Gy in 5 fractions using an en face electron field. The total dose was 60.4 Gy.   07/2019 - 07/2029 Anti-estrogen oral therapy   Tamoxifen   08/20/2019 Genetic Testing   Negative genetic testing. 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 The report date is 08/16/2019.  The Common Hereditary Cancers Panel offered by [facility] includes sequencing and/or deletion duplication testing of the following 48 genes: APC, ATM, AXIN2, BARD1, BMPR1A, BRCA1, BRCA2, BRIP1, CDH1, CDKN2A (p14ARF), CDKN2A (p16INK4a), CKD4, CHEK2, CTNNA1, DICER1, EPCAM (Deletion/duplication testing only), GREM1 (promoter region deletion/duplication testing only), KIT, MEN1, MLH1, MSH2, MSH3, MSH6, MUTYH, NBN, NF1, NHTL1, PALB2, PDGFRA, PMS2, POLD1, POLE, PTEN, RAD50, RAD51C, RAD51D, RNF43, SDHB, SDHC, SDHD, SMAD4, SMARCA4. STK11, TP53, TSC1, TSC2, and VHL.  The following genes were evaluated for sequence changes only: SDHA and HOXB13 c.251G>A variant only.     INTERVAL HISTORY:  Ms. SCromieto review her survivorship care plan detailing her treatment course for breast cancer, as well as monitoring long-term side effects of that treatment, education regarding health maintenance, screening, and overall wellness and health promotion.     Overall, Ms. Sickles reports feeling quite well.  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  She continues on  Tamoxifen with good tolerance.  She does have some hot flashes and isn't sure if they are significant enough to try Gabapentin to help alleviate them.  Her  Survivorship survey was completed.    REVIEW OF SYSTEMS:  Review of Systems  Constitutional: Negative for appetite change, chills, fatigue and fever.  HENT:   Negative for hearing loss and lump/mass.   Eyes: Negative for eye problems and icterus.  Respiratory: Negative for chest tightness, cough and shortness of breath.   Cardiovascular: Negative for chest pain, leg swelling and palpitations.  Gastrointestinal: Negative for abdominal distention, abdominal pain, constipation, diarrhea, nausea and vomiting.  Endocrine: Positive for hot flashes.  Genitourinary: Negative for difficulty urinating.   Musculoskeletal: Negative for arthralgias.  Skin: Negative for itching and rash.  Neurological: Negative for dizziness, extremity weakness, headaches and numbness.  Hematological: Negative for adenopathy. Does not bruise/bleed easily.  Psychiatric/Behavioral: Negative for depression. The patient is not nervous/anxious.   Breast: Denies any new nodularity, masses, tenderness, nipple changes, or nipple discharge.      ONCOLOGY TREATMENT TEAM:  1. Surgeon:  Dr. Marlou Starks at [facility] 2. Medical Oncologist: Dr. Jana Hakim  3. Radiation Oncologist: Dr. Lisbeth Renshaw 4. Plastic Surgeon: Dr. [name]    PAST MEDICAL/SURGICAL HISTORY:  Past Medical History:  Diagnosis Date  . Cancer ([place]) 10/18/2018   left breast cancer  . Family history of cancer    Past Surgical History:  Procedure Laterality Date  . BREAST RECONSTRUCTION WITH PLACEMENT OF TISSUE EXPANDER AND FLEX HD (ACELLULAR HYDRATED DERMIS) Left 10/18/2018   Procedure: IMMEDIATEVBREAST RECONSTRUCTION WITH PLACEMENT OF TISSUE EXPANDER AND FLEX HD (ACELLULAR HYDRATED DERMIS);  Surgeon: Wallace Going, DO;  Location: [facility];  Service: Plastics;  Laterality: Left;  Marland Kitchen MASTECTOMY    . MASTECTOMY W/ SENTINEL NODE BIOPSY Left 10/18/2018   Procedure: LEFT MASTECTOMY WITH SENTINEL LYMPH NODE BIOPSY;  Surgeon: Jovita Kussmaul, MD;  Location: [facility];  Service: General;  Laterality: Left;  . PORT-A-CATH REMOVAL Right 04/12/2019   Procedure: REMOVAL PORT-A-CATH;  Surgeon: Wallace Going, DO;  Location: [facility] [facility];  Service: Plastics;  Laterality: Right;  . PORTACATH PLACEMENT Right 11/02/2018   Procedure: INSERTION PORT-A-CATH WITH ULTRASOUND;  Surgeon: Jovita Kussmaul, MD;  Location: [facility];  Service: General;  Laterality: Right;  . REMOVAL OF TISSUE EXPANDER AND PLACEMENT OF IMPLANT Left 04/12/2019   Procedure: REMOVAL OF TISSUE EXPANDER AND PLACEMENT OF IMPLANT;  Surgeon: Wallace Going, DO;  Location: [place] [facility];  Service: Plastics;  Laterality: Left;  90 min, please     ALLERGIES:  Allergies  Allergen Reactions  . Septra [Sulfamethoxazole-Trimethoprim]      CURRENT MEDICATIONS:  Outpatient Encounter Medications as of 11/06/2019  Medication Sig  . estradiol (ESTRING) 2 MG vaginal ring Place 2 mg vaginally every 3 (three) months. follow package directions  . gabapentin (NEURONTIN) 300 MG capsule Take 1 capsule (300 mg total) by mouth at bedtime.  . tamoxifen (NOLVADEX) 20 MG tablet Take 1 tablet (20 mg total) by mouth daily.   No facility-administered encounter medications on file as of 11/06/2019.     ONCOLOGIC FAMILY HISTORY:  Family History  Problem Relation Age of Onset  . Skin cancer Father        on back  . Skin cancer Paternal Grandmother        on nose  . Breast cancer Neg Hx   . Ovarian cancer Neg Hx  GENETIC COUNSELING/TESTING: See above  SOCIAL HISTORY:  Social History   Socioeconomic History  . Marital status: Married    Spouse name: Richard  . Number of children: Not on file  . Years of education: Not on file  . Highest education level: Not on file  Occupational History  . Not on file  Tobacco Use  . Smoking status: Never Smoker  . Smokeless tobacco: Never Used  Substance and Sexual  Activity  . Alcohol use: Yes    Alcohol/week: 3.0 standard drinks    Types: 3 Standard drinks or equivalent per week    Comment: social  . Drug use: Never  . Sexual activity: Not on file  Other Topics Concern  . Not on file  Social History Narrative  . Not on file   Social Determinants of Health   Financial Resource Strain:   . Difficulty of Paying Living Expenses:   Food Insecurity:   . Worried About Charity fundraiser in the Last Year:   . Arboriculturist in the Last Year:   Transportation Needs: No Transportation Needs  . Lack of Transportation (Medical): No  . Lack of Transportation (Non-Medical): No  Physical Activity:   . Days of Exercise per Week:   . Minutes of Exercise per Session:   Stress:   . Feeling of Stress :   Social Connections:   . Frequency of Communication with Friends and Family:   . Frequency of Social Gatherings with Friends and Family:   . Attends Religious Services:   . Active Member of Clubs or Organizations:   . Attends Archivist Meetings:   Marland Kitchen Marital Status:   Intimate Partner Violence:   . Fear of Current or Ex-Partner:   . Emotionally Abused:   Marland Kitchen Physically Abused:   . Sexually Abused:      OBSERVATIONS/OBJECTIVE:  BP 116/74 (BP Location: Right Arm, Patient Position: Sitting)   Pulse 76   Temp 98.9 F (37.2 C) (Temporal)   Resp 18   Ht 5' 6" (1.676 m)   Wt 152 lb 6.4 oz (69.1 kg)   SpO2 100%   BMI 24.60 kg/m  GENERAL: Patient is a well appearing female in no acute distress HEENT:  Sclerae anicteric.  Mask in place. Neck is supple.  NODES:  No cervical, supraclavicular, or axillary lymphadenopathy palpated.  BREAST EXAM:  Left breast s/p mastectomy and reconstruction, no sign of local recurrence. Right breast benign LUNGS:  Clear to auscultation bilaterally.  No wheezes or rhonchi. HEART:  Regular rate and rhythm. No murmur appreciated. ABDOMEN:  Soft, nontender.  Positive, normoactive bowel sounds. No organomegaly  palpated. MSK:  No focal spinal tenderness to palpation. Full range of motion bilaterally in the upper extremities. EXTREMITIES:  No peripheral edema.   SKIN:  Clear with no obvious rashes or skin changes. No nail dyscrasia. NEURO:  Nonfocal. Well oriented.  Appropriate affect.    LABORATORY DATA:  None for this visit.  DIAGNOSTIC IMAGING:  None for this visit.      ASSESSMENT AND PLAN:  Ms.. Spainhower is a pleasant 49 y.o. 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  She presents to the Survivorship Clinic for our initial meeting and routine follow-up post-completion of treatment for breast cancer.    1. Stage IB left breast cancer:  Ms. Adney is continuing to recover from  definitive treatment for breast cancer. She will follow-up with her medical oncologist, Dr. Jana Hakim on 04/22/2020 with history and physical exam per surveillance protocol.  She will continue her anti-estrogen therapy with Tamoxifen. Thus far, she is tolerating the Tamoxifen relatively well.  She has decided to hold off on taking Gabapentin for the hot flashes for now.  She was recommended to continue with annual right breast mammograms when they are due. Today, a comprehensive survivorship care plan and treatment summary was reviewed with the patient today detailing her breast cancer diagnosis, treatment course, potential late/long-term effects of treatment, appropriate follow-up care with recommendations for the future, and patient education resources.  A copy of this summary, along with a letter will be sent to the patient's primary care provider via mail/fax/In Basket message after today's visit.    2. Bone health:  Given Ms. Granlund's age/history of breast cancer, she is at slight risk for bone demineralization.  She was given  education on specific activities to promote bone health.  3. Cancer screening:  Due to Ms. Ligas's history and her age, she should receive screening for skin cancers, colon cancer, and gynecologic cancers.  The information and recommendations are listed on the patient's comprehensive care plan/treatment summary and were reviewed in detail with the patient.    4. Health maintenance and wellness promotion: Ms. Arif was encouraged to consume 5-7 servings of fruits and vegetables per day. We reviewed the "Nutrition Rainbow" handout, as well as the handout "Take Control of Your Health and Reduce Your Cancer Risk" from the [facility].  She was also encouraged to engage in moderate to vigorous exercise for 30 minutes per day most days of the week. We discussed the [employer] fitness program, which is designed for cancer survivors to help them become more physically fit after cancer treatments.  She was instructed to limit her alcohol consumption and continue to abstain from tobacco use.     5. Support services/counseling: It is not uncommon for this period of the patient's cancer care trajectory to be one of many emotions and stressors.  We discussed how this can be increasingly difficult during the times of quarantine and social distancing due to the COVID-19 pandemic.   She was given information regarding our available services and encouraged to contact me with any questions or for help enrolling in any of our support group/programs.    Follow up instructions:    -Return to cancer center in 04/2020 for f/u with Dr. Jana Hakim   -She is welcome to return back to the Survivorship Clinic at any time; no additional follow-up needed at this time.  -Consider referral back to survivorship as a long-term survivor for continued surveillance  Total encounter time: 45 minutes  Wilber Bihari, NP 11/09/19 12:22 PM Medical Oncology and Hematology [facility] [place], [place] [address] Tel. [phone]    Fax. [phone]  *Total Encounter Time as defined by the Centers for Medicare and Medicaid Services includes, in addition to the face-to-face time of a patient visit (documented in the note above) non-face-to-face time: obtaining and reviewing outside history, ordering and reviewing medications, tests or procedures, care coordination (communications with other health care professionals or caregivers) and documentation in the medical record.

## 2019-11-27 ENCOUNTER — Other Ambulatory Visit: Payer: Self-pay | Admitting: Radiology

## 2019-11-27 DIAGNOSIS — Z853 Personal history of malignant neoplasm of breast: Secondary | ICD-10-CM

## 2019-11-27 DIAGNOSIS — R922 Inconclusive mammogram: Secondary | ICD-10-CM

## 2019-11-27 DIAGNOSIS — Z9189 Other specified personal risk factors, not elsewhere classified: Secondary | ICD-10-CM

## 2019-12-27 ENCOUNTER — Other Ambulatory Visit: Payer: Self-pay

## 2019-12-27 ENCOUNTER — Ambulatory Visit
Admission: RE | Admit: 2019-12-27 | Discharge: 2019-12-27 | Disposition: A | Discharge status: Home / Self Care | Payer: 59 | Source: Ambulatory Visit | Attending: Radiology | Admitting: Radiology

## 2019-12-27 DIAGNOSIS — R922 Inconclusive mammogram: Secondary | ICD-10-CM

## 2019-12-27 DIAGNOSIS — Z9189 Other specified personal risk factors, not elsewhere classified: Secondary | ICD-10-CM

## 2019-12-27 DIAGNOSIS — Z853 Personal history of malignant neoplasm of breast: Secondary | ICD-10-CM

## 2019-12-27 MED ORDER — GADOBUTROL 1 MMOL/ML IV SOLN
7.0000 mL | Freq: Once | INTRAVENOUS | Status: AC | PRN
  Administered 2019-12-27: 7 mL via INTRAVENOUS

## 2020-01-31 NOTE — Progress Notes (Signed)
[place]  [phone] Fax:([phone]) [phone]    ID: [name] DOB: 1971/06/10  MR#: [id number]  SUP#:[id number]  Patient Care Team: Orpah Melter, MD as PCP - General (Family Medicine) Jovita Kussmaul, MD as Consulting Physician (General Surgery) Khan Chura, Virgie Dad, MD as Consulting Physician (Oncology) Kyung Rudd, MD as Consulting Physician (Radiation Oncology) Aloha Gell, MD as Consulting Physician (Obstetrics and Gynecology) [name], Loel Lofty, DO as Attending Physician (Plastic Surgery) Chauncey Cruel, MD OTHER MD:   CHIEF COMPLAINT: Estrogen receptor positive breast cancer  CURRENT TREATMENT: Tamoxifen   INTERVAL HISTORY: [name] returns today for follow-up and treatment of her estrogen receptor positive breast cancer. She was last seen here on 11/06/2019.   Since her last visit here, she underwent a bilateral breast MRI with and without contrast on 12/27/2019 revealing no MRI evidence of malignancy involving the right breast. Prior left mastectomy. No evidence of recurrence in the left mastectomy bed. Intact subpectoral implant.  However about 2 weeks ago she noted a change or perhaps something she had not noticed before in the upper portion of her left reconstructed breast area.  She brought it to her primary care physician who confirmed that it was there and suggested she drop by which she is doing today.  She continues on tamoxifen.  She does have hot flashes which do wake her up at night but she has not wanted to take gabapentin although she does have it on hand.  She does not have vaginal wetness from this.  She still has not had any periods since her chemotherapy.  REVIEW OF SYSTEMS: [name] wears her compression sleeve during the day, not at night.  Sometimes she does wake up with left arm numbness with if she sleeps on that side.  She has had both doses of the Pfizer COVID-19 vaccine and tolerated that well.  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  Aside from these issues a detailed review of systems was stable  HISTORY OF CURRENT ILLNESS: From the original intake note:  "[name]" presented with left nipple retraction for 1 week with retroareolar mass in the upper outer quadrant. She underwent bilateral diagnostic mammography with CAD and left breast ultrasonography at [facility] on 09/25/2018 showing: 7 mm oval duct in the left breast; no significant abnormalities in the left axilla. She also underwent additional imaging with left breast digital diagnostic mammogram on 10/02/2018 showing: new 0.4 cm cluster of grouped heterogeneous calcifications in the left breast.   Accordingly on 10/02/2018 she proceeded to biopsy of the left breast area in question. The pathology [id number]) from this procedure showed: mammary carcinoma in situ at the 3 o'clock mass, with possible focal microinvasion; invasive and in situ mammary carcinoma at the 12 o'clock mass, immunostain for E-cadherin is negative in the tumor cells, consistent with a lobular phenotype. Prognostic indicators significant for: estrogen receptor, 90% positive, with moderate staining intensity and progesterone receptor, 100% positive, with strong staining intensity. Proliferation marker Ki67 at <1%. HER2 negative (1+).   The patient's subsequent history is as detailed above.    PAST MEDICAL HISTORY: Past Medical History:  Diagnosis Date  . Cancer ([place]) 10/18/2018   left breast cancer  . Family history of cancer     PAST SURGICAL HISTORY: Past Surgical History:  Procedure Laterality Date  . BREAST RECONSTRUCTION WITH PLACEMENT OF TISSUE EXPANDER AND FLEX HD (ACELLULAR HYDRATED DERMIS) Left 10/18/2018   Procedure: IMMEDIATEVBREAST RECONSTRUCTION WITH  PLACEMENT OF TISSUE EXPANDER AND FLEX HD (ACELLULAR HYDRATED DERMIS);  Surgeon: Wallace Going, DO;  Location: [place];  Service: Plastics;   Laterality: Left;  Marland Kitchen MASTECTOMY    . MASTECTOMY W/ SENTINEL NODE BIOPSY Left 10/18/2018   Procedure: LEFT MASTECTOMY WITH SENTINEL LYMPH NODE BIOPSY;  Surgeon: Jovita Kussmaul, MD;  Location: [place];  Service: General;  Laterality: Left;  . PORT-A-CATH REMOVAL Right 04/12/2019   Procedure: REMOVAL PORT-A-CATH;  Surgeon: Wallace Going, DO;  Location: [place];  Service: Plastics;  Laterality: Right;  . PORTACATH PLACEMENT Right 11/02/2018   Procedure: INSERTION PORT-A-CATH WITH ULTRASOUND;  Surgeon: Jovita Kussmaul, MD;  Location: [place];  Service: General;  Laterality: Right;  . REMOVAL OF TISSUE EXPANDER AND PLACEMENT OF IMPLANT Left 04/12/2019   Procedure: REMOVAL OF TISSUE EXPANDER AND PLACEMENT OF IMPLANT;  Surgeon: Wallace Going, DO;  Location: [place];  Service: Plastics;  Laterality: Left;  90 min, please    FAMILY HISTORY Family History  Problem Relation Age of Onset  . Skin cancer Father        on back  . Skin cancer Paternal Grandmother        on nose  . Breast cancer Neg Hx   . Ovarian cancer Neg Hx    As of March 2020, patient's father is living and healthy at age 30. Patient's mother is also living and healthy at age 45. The patient denies a family hx of breast or ovarian cancer. She has 1 sister.   GYNECOLOGIC HISTORY:  Menarche: 49 years old Age at first live birth: 49 years old Soper P 2 LMP June 2020 Contraceptive: Mirena IUD has been replaced by a ParaGard IUD as of March 2020 She used birth control pills between ages 39 to 4 w/o event HRT n/a  Hysterectomy? no BSO? 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  She has a PhD in Probation officer.  Husband [name] is an Art gallery manager.  They married in 2018.  At home it is the 2 of them and 3 children. She has two children, [name] age 93 and [name] age 35. Husband [name] has one daughter,  Caydee Talkington age 57. ADVANCED DIRECTIVES: In the absence of any documentation to the contrary her husband [name] is her HCPOA.   HEALTH MAINTENANCE: Social History   Tobacco Use  . Smoking status: Never Smoker  . Smokeless tobacco: Never Used  Vaping Use  . Vaping Use: Never used  Substance Use Topics  . Alcohol use: Yes    Alcohol/week: 3.0 standard drinks    Types: 3 Standard drinks or equivalent per week    Comment: social  . Drug use: Never     Colonoscopy: never done  PAP: 08/2017  Bone density: never done   Allergies  Allergen Reactions  . Septra [Sulfamethoxazole-Trimethoprim]     Current Outpatient Medications  Medication Sig Dispense Refill  . estradiol (ESTRING) 2 MG vaginal ring Place 2 mg vaginally every 3 (three) months. follow package directions 1 each 12  . gabapentin (NEURONTIN) 300 MG capsule Take 1 capsule (300 mg total) by mouth at bedtime. 90 capsule 4   No current facility-administered medications for this visit.    OBJECTIVE: white woman who appears younger than stated age  9:   02/01/20 1415  BP: 130/70  Pulse: 90  Resp: 18  Temp: 98.7 F (  37.1 C)  SpO2: 100%   Wt Readings from Last 3 Encounters:  02/01/20 154 lb 11.2 oz (70.2 kg)  11/06/19 152 lb 6.4 oz (69.1 kg)  11/06/19 152 lb (68.9 kg)   Body mass index is 24.97 kg/m.    ECOG FS:1 - Symptomatic but completely ambulatory  Hair has come back quite curly Ocular: Sclerae unicteric Ear-nose-throat: Wearing a mask Lymphatic: No cervical or supraclavicular adenopathy Lungs no rales or rhonchi Heart regular rate and rhythm Abd soft, nontender, positive bowel sounds MSK no focal spinal tenderness, no joint edema Neuro: non-focal, well-oriented, appropriate affect Breasts: The right breast is unremarkable per the left breast is status post mastectomy with silicone implant reconstruction.  The cosmetic result is good.  Skin on the upper portion of the left breast  reconstructed area as indicated in the picture below there is a 2 to 3 mm subcutaneous lesion which is not tender or erythematous.  Inferiorly it has fairly well demarcated borders superiorly it tends to emerge with the rest of the tissue.    Left beast area with quesitonable spot marked 02/01/2020    LAB RESULTS:  CMP     Component Value Date/Time   NA 142 10/11/2019 0751   K 4.3 10/11/2019 0751   CL 109 10/11/2019 0751   CO2 24 10/11/2019 0751   GLUCOSE 138 (H) 10/11/2019 0751   BUN 14 10/11/2019 0751   CREATININE 0.90 10/11/2019 0751   CALCIUM 8.7 (L) 10/11/2019 0751   PROT 6.3 (L) 10/11/2019 0751   ALBUMIN 3.6 10/11/2019 0751   AST 16 10/11/2019 0751   ALT 14 10/11/2019 0751   ALKPHOS 61 10/11/2019 0751   BILITOT 0.4 10/11/2019 0751   GFRNONAA >60 10/11/2019 0751   GFRAA >60 10/11/2019 0751    No results found for: TOTALPROTELP, ALBUMINELP, A1GS, A2GS, BETS, BETA2SER, GAMS, MSPIKE, SPEI  No results found for: KPAFRELGTCHN, LAMBDASER, KAPLAMBRATIO  Lab Results  Component Value Date   WBC 3.6 (L) 10/11/2019   NEUTROABS 2.1 10/11/2019   HGB 13.8 10/11/2019   HCT 42.9 10/11/2019   MCV 94.7 10/11/2019   PLT 246 10/11/2019    No results found for: LABCA2  No components found for: APOLID030  No results for input(s): INR in the last 168 hours.  No results found for: LABCA2  No results found for: DTH438  No results found for: OIL579  No results found for: JKQ206  No results found for: CA2729  No components found for: HGQUANT  No results found for: CEA1 / No results found for: CEA1   No results found for: AFPTUMOR  No results found for: CHROMOGRNA  No results found for: HGBA, HGBA2QUANT, HGBFQUANT, HGBSQUAN (Hemoglobinopathy evaluation)   No results found for: LDH  No results found for: IRON, TIBC, IRONPCTSAT (Iron and TIBC)  No results found for: FERRITIN  Urinalysis    Component Value Date/Time   COLORURINE YELLOW 03/05/2019 0912    APPEARANCEUR CLEAR 03/05/2019 0912   LABSPEC 1.017 03/05/2019 0912   PHURINE 5.0 03/05/2019 0912   GLUCOSEU NEGATIVE 03/05/2019 0912   HGBUR NEGATIVE 03/05/2019 0912   BILIRUBINUR NEGATIVE 03/05/2019 0912   KETONESUR NEGATIVE 03/05/2019 0912   PROTEINUR NEGATIVE 03/05/2019 0912   UROBILINOGEN 0.2 01/27/2008 0235   NITRITE NEGATIVE 03/05/2019 0912   LEUKOCYTESUR NEGATIVE 03/05/2019 0912     STUDIES: No results found.   ELIGIBLE FOR AVAILABLE RESEARCH PROTOCOL: no   ASSESSMENT: 49 y.o. [facility] woman status post left breast upper outer quadrant biopsy 10/02/2018  for a clinical T2N0, stage IB invasive lobular breast cancer, grade not stated, estrogen and progesterone receptor positive, HER-2 not amplified, with an MIB-1 of less than 1%  (1) status post left mastectomy and sentinel lymph node sampling 10/18/2018 for a pT3 pN2, stage IIA invasive lobular breast cancer, grade 2, with close but negative margins  (a) 5 of 9 lymph nodes removed had micrometastatic deposits of tumor  (b) immediate expander placement  (c) definitive silicone implant placement 04/12/2019 with [employer] Smooth Round High Profile Gel 225cc. Ref #[id number].  Serial Number [id number]  (2) chemotherapy consisting of doxorubicin and cyclophosphamide in dose dense fashion x4 started 11/22/2018, completed 01/02/2019, followed by weekly paclitaxel x12 starting 01/15/2019, completed 04/02/2019  (a) received 11 of 12 planned taxol cycles (last cycle d/c'd to accommodate surgery)  (3) adjuvant radiation 05/21/2019 - 07/05/2019  (a) left chest wall / 50.4 Gy in 28 fractions  (b) boost / 10 Gy in 5 fractions  (4) tamoxifen started 08/16/2019  (a) FSH/estradiol levels consistent with menopause 05/09/2019 and 08/01/2019  (5) genetics tive genetic testing 08/16/2019 through the [facility] Common Hereditary Cancers Panel found no deleterious mutations in APC, ATM, AXIN2, BARD1, BMPR1A, BRCA1, BRCA2, BRIP1, CDH1, CDKN2A (p14ARF),  CDKN2A (p16INK4a), CKD4, CHEK2, CTNNA1, DICER1, EPCAM (Deletion/duplication testing only), GREM1 (promoter region deletion/duplication testing only), KIT, MEN1, MLH1, MSH2, MSH3, MSH6, MUTYH, NBN, NF1, NHTL1, PALB2, PDGFRA, PMS2, POLD1, POLE, PTEN, RAD50, RAD51C, RAD51D, RNF43, SDHB, SDHC, SDHD, SMAD4, SMARCA4. STK11, TP53, TSC1, TSC2, and VHL.  The following genes were evaluated for sequence changes only: SDHA and HOXB13 c.251G>A variant only.  (a) VUS in [place] called [id number] identified  PLAN: [name] is now just about a year out from definitive surgery for her breast cancer with no evidence of disease recurrence.  This is very favorable.  She is tolerating tamoxifen well.  The cramps that she was experiencing are already fading.  If she does not have cramps in the next 2 or 3 weeks she can stop the magnesium.  In the meantime she is making a good job of hydrating herself and she is walking and stretching appropriately.  I encouraged her to use her left upper extremity compression sleeve as much as she can.  I reassured her that the numbness she feels sometimes in the morning in her left forearm is related to the scar tissue from her left axillary surgery and not from breast cancer recurrence.  She is due for mammography and I have put in the appropriate orders for that.  We are going to give gabapentin a try at bedtime.  I think that will help her sleep and have fewer hot flashes in the evening.  We discussed her being at risk for recurrence, although I do not expect any bad news.  At the 2-year mark we may want to consider doing an F-18 estradiol PET scan.  She will see me again in 6 months.  She knows to call for any other issue that may develop before then.  Total encounter time 25 minutes.*    Magrinat, Virgie Dad, MD  02/01/20 2:26 PM Medical Oncology and Hematology [facility] [place], [place] [address] Tel. [phone]    Fax. [phone]   I,  Jacqualyn Posey am acting as a Education administrator for Chauncey Cruel, MD.       *Total Encounter Time as defined by the Centers for Medicare and Medicaid Services includes, in addition to the face-to-face time of a patient visit (documented  in the note above) non-face-to-face time: obtaining and reviewing outside history, ordering and reviewing medications, tests or procedures, care coordination (communications with other health care professionals or caregivers) and documentation in the medical record.

## 2020-02-01 ENCOUNTER — Other Ambulatory Visit: Payer: Self-pay | Admitting: Oncology

## 2020-02-01 ENCOUNTER — Other Ambulatory Visit: Payer: Self-pay

## 2020-02-01 ENCOUNTER — Inpatient Hospital Stay: Payer: 59 | Attending: Oncology | Admitting: Oncology

## 2020-02-01 VITALS — BP 130/70 | HR 90 | Temp 98.7°F | Resp 18 | Ht 66.0 in | Wt 154.7 lb

## 2020-02-01 DIAGNOSIS — R200 Anesthesia of skin: Secondary | ICD-10-CM | POA: Diagnosis not present

## 2020-02-01 DIAGNOSIS — Z923 Personal history of irradiation: Secondary | ICD-10-CM | POA: Insufficient documentation

## 2020-02-01 DIAGNOSIS — Z170 Estrogen receptor positive status [ER+]: Secondary | ICD-10-CM | POA: Diagnosis not present

## 2020-02-01 DIAGNOSIS — C50412 Malignant neoplasm of upper-outer quadrant of left female breast: Secondary | ICD-10-CM

## 2020-02-01 DIAGNOSIS — Z9221 Personal history of antineoplastic chemotherapy: Secondary | ICD-10-CM | POA: Diagnosis not present

## 2020-02-01 DIAGNOSIS — N898 Other specified noninflammatory disorders of vagina: Secondary | ICD-10-CM | POA: Insufficient documentation

## 2020-02-01 DIAGNOSIS — Z9012 Acquired absence of left breast and nipple: Secondary | ICD-10-CM | POA: Insufficient documentation

## 2020-02-01 NOTE — Progress Notes (Signed)
[name]  [phone] [phone]    ID: [name] DOB: October 05, 1970  MR#: [id number]  PTW#:[id number]  Patient Care Team: Orpah Melter, MD as PCP - General (Family Medicine) Jovita Kussmaul, MD as Consulting Physician (General Surgery) Kaleel Schmieder, Virgie Dad, MD as Consulting Physician (Oncology) Kyung Rudd, MD as Consulting Physician (Radiation Oncology) Aloha Gell, MD as Consulting Physician (Obstetrics and Gynecology) [name], Loel Lofty, DO as Attending Physician (Plastic Surgery) Chauncey Cruel, MD OTHER MD:   CHIEF COMPLAINT: Estrogen receptor positive breast cancer  CURRENT TREATMENT: Tamoxifen   INTERVAL HISTORY: [name] returns today for follow-up and treatment of her estrogen receptor positive breast cancer. She was last seen here on 11/06/2019.   Since her last visit here, she underwent a bilateral breast MRI with and without contrast on 12/27/2019 revealing no MRI evidence of malignancy involving the right breast. Prior left mastectomy. No evidence of recurrence in the left mastectomy bed. Intact subpectoral implant.  However about 2 weeks ago she noted a change or perhaps something she had not noticed before in the upper portion of her left reconstructed breast area.  She brought it to her primary care physician who confirmed that it was there and suggested she drop by which she is doing today.  She continues on tamoxifen.  She does have hot flashes which do wake her up at night but she has not wanted to take gabapentin although she does have it on hand.  She does not have vaginal wetness from this.  She still has not had any periods since her chemotherapy.  REVIEW OF SYSTEMS: Armiyah wears her compression sleeve during the day, not at night.  Sometimes she does wake up with left arm numbness with if she sleeps on that side.  She has had both doses of the Pfizer COVID-19 vaccine and tolerated that well.  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  Aside from these issues a detailed review of systems was stable  HISTORY OF CURRENT ILLNESS: From the original intake note:  "[name]" presented with left nipple retraction for 1 week with retroareolar mass in the upper outer quadrant. She underwent bilateral diagnostic mammography with CAD and left breast ultrasonography at [facility] on 09/25/2018 showing: 7 mm oval duct in the left breast; no significant abnormalities in the left axilla. She also underwent additional imaging with left breast digital diagnostic mammogram on 10/02/2018 showing: new 0.4 cm cluster of grouped heterogeneous calcifications in the left breast.   Accordingly on 10/02/2018 she proceeded to biopsy of the left breast area in question. The pathology [id number]) from this procedure showed: mammary carcinoma in situ at the 3 o'clock mass, with possible focal microinvasion; invasive and in situ mammary carcinoma at the 12 o'clock mass, immunostain for E-cadherin is negative in the tumor cells, consistent with a lobular phenotype. Prognostic indicators significant for: estrogen receptor, 90% positive, with moderate staining intensity and progesterone receptor, 100% positive, with strong staining intensity. Proliferation marker Ki67 at <1%. HER2 negative (1+).   The patient's subsequent history is as detailed above.    PAST MEDICAL HISTORY: Past Medical History:  Diagnosis Date  . Cancer ([place]) 10/18/2018   left breast cancer  . Family history of cancer     PAST SURGICAL HISTORY: Past Surgical History:  Procedure Laterality Date  . BREAST RECONSTRUCTION WITH PLACEMENT OF TISSUE EXPANDER AND FLEX HD (ACELLULAR HYDRATED DERMIS) Left 10/18/2018   Procedure: IMMEDIATEVBREAST RECONSTRUCTION WITH  PLACEMENT OF TISSUE EXPANDER AND FLEX HD (ACELLULAR HYDRATED DERMIS);  Surgeon: Wallace Going, DO;  Location: [facility];  Service: Plastics;   Laterality: Left;  Marland Kitchen MASTECTOMY    . MASTECTOMY W/ SENTINEL NODE BIOPSY Left 10/18/2018   Procedure: LEFT MASTECTOMY WITH SENTINEL LYMPH NODE BIOPSY;  Surgeon: Jovita Kussmaul, MD;  Location: [facility];  Service: General;  Laterality: Left;  . PORT-A-CATH REMOVAL Right 04/12/2019   Procedure: REMOVAL PORT-A-CATH;  Surgeon: Wallace Going, DO;  Location: [facility];  Service: Plastics;  Laterality: Right;  . PORTACATH PLACEMENT Right 11/02/2018   Procedure: INSERTION PORT-A-CATH WITH ULTRASOUND;  Surgeon: Jovita Kussmaul, MD;  Location: [facility];  Service: General;  Laterality: Right;  . REMOVAL OF TISSUE EXPANDER AND PLACEMENT OF IMPLANT Left 04/12/2019   Procedure: REMOVAL OF TISSUE EXPANDER AND PLACEMENT OF IMPLANT;  Surgeon: Wallace Going, DO;  Location: [facility];  Service: Plastics;  Laterality: Left;  90 min, please    FAMILY HISTORY Family History  Problem Relation Age of Onset  . Skin cancer Father        on back  . Skin cancer Paternal Grandmother        on nose  . Breast cancer Neg Hx   . Ovarian cancer Neg Hx    As of March 2020, patient's father is living and healthy at age 8. Patient's mother is also living and healthy at age 81. The patient denies a family hx of breast or ovarian cancer. She has 1 sister.   GYNECOLOGIC HISTORY:  Menarche: 49 years old Age at first live birth: 49 years old [place] P 2 LMP June 2020 Contraceptive: Mirena IUD has been replaced by a ParaGard IUD as of March 2020 She used birth control pills between ages 57 to 43 w/o event HRT n/a  Hysterectomy? no BSO? 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  She has a PhD in Probation officer.  Husband [name] is an Art gallery manager.  They married in 2018.  At home it is the 2 of them and 3 children. She has two children, [name] age 41 and [name] age 13. Husband [name] has one daughter,  Dorothyann Mourer age 56. ADVANCED DIRECTIVES: In the absence of any documentation to the contrary her husband [name] is her HCPOA.   HEALTH MAINTENANCE: Social History   Tobacco Use  . Smoking status: Never Smoker  . Smokeless tobacco: Never Used  Vaping Use  . Vaping Use: Never used  Substance Use Topics  . Alcohol use: Yes    Alcohol/week: 3.0 standard drinks    Types: 3 Standard drinks or equivalent per week    Comment: social  . Drug use: Never     Colonoscopy: never done  PAP: 08/2017  Bone density: never done   Allergies  Allergen Reactions  . Septra [Sulfamethoxazole-Trimethoprim]     Current Outpatient Medications  Medication Sig Dispense Refill  . estradiol (ESTRING) 2 MG vaginal ring Place 2 mg vaginally every 3 (three) months. follow package directions 1 each 12  . gabapentin (NEURONTIN) 300 MG capsule Take 1 capsule (300 mg total) by mouth at bedtime. 90 capsule 4   No current facility-administered medications for this visit.    OBJECTIVE: white woman who appears younger than stated age  There were no vitals filed for this visit. Wt Readings from Last 3 Encounters:  02/01/20 154 lb  11.2 oz (70.2 kg)  11/06/19 152 lb 6.4 oz (69.1 kg)  11/06/19 152 lb (68.9 kg)   There is no height or weight on file to calculate BMI.    ECOG FS:1 - Symptomatic but completely ambulatory  Hair has come back quite curly Ocular: Sclerae unicteric Ear-nose-throat: Wearing a mask Lymphatic: No cervical or supraclavicular adenopathy Lungs no rales or rhonchi Heart regular rate and rhythm Abd soft, nontender, positive bowel sounds MSK no focal spinal tenderness, no joint edema Neuro: non-focal, well-oriented, appropriate affect Breasts: The right breast is unremarkable per the left breast is status post mastectomy with silicone implant reconstruction.  The cosmetic result is good.  Skin on the upper portion of the left breast reconstructed area as indicated in the picture  below there is a 2 to 3 mm subcutaneous lesion which is not tender or erythematous.  Inferiorly it has fairly well demarcated borders superiorly it tends to emerge with the rest of the tissue.    Left beast area with quesitonable spot marked 02/01/2020    LAB RESULTS:  CMP     Component Value Date/Time   NA 142 10/11/2019 0751   K 4.3 10/11/2019 0751   CL 109 10/11/2019 0751   CO2 24 10/11/2019 0751   GLUCOSE 138 (H) 10/11/2019 0751   BUN 14 10/11/2019 0751   CREATININE 0.90 10/11/2019 0751   CALCIUM 8.7 (L) 10/11/2019 0751   PROT 6.3 (L) 10/11/2019 0751   ALBUMIN 3.6 10/11/2019 0751   AST 16 10/11/2019 0751   ALT 14 10/11/2019 0751   ALKPHOS 61 10/11/2019 0751   BILITOT 0.4 10/11/2019 0751   GFRNONAA >60 10/11/2019 0751   GFRAA >60 10/11/2019 0751    No results found for: TOTALPROTELP, ALBUMINELP, A1GS, A2GS, BETS, BETA2SER, GAMS, MSPIKE, SPEI  No results found for: KPAFRELGTCHN, LAMBDASER, KAPLAMBRATIO  Lab Results  Component Value Date   WBC 3.6 (L) 10/11/2019   NEUTROABS 2.1 10/11/2019   HGB 13.8 10/11/2019   HCT 42.9 10/11/2019   MCV 94.7 10/11/2019   PLT 246 10/11/2019    No results found for: LABCA2  No components found for: PFXTKW409  No results for input(s): INR in the last 168 hours.  No results found for: LABCA2  No results found for: BDZ329  No results found for: JME268  No results found for: TMH962  No results found for: CA2729  No components found for: HGQUANT  No results found for: CEA1 / No results found for: CEA1   No results found for: AFPTUMOR  No results found for: CHROMOGRNA  No results found for: HGBA, HGBA2QUANT, HGBFQUANT, HGBSQUAN (Hemoglobinopathy evaluation)   No results found for: LDH  No results found for: IRON, TIBC, IRONPCTSAT (Iron and TIBC)  No results found for: FERRITIN  Urinalysis    Component Value Date/Time   COLORURINE YELLOW 03/05/2019 0912   APPEARANCEUR CLEAR 03/05/2019 0912   LABSPEC 1.017  03/05/2019 0912   PHURINE 5.0 03/05/2019 0912   GLUCOSEU NEGATIVE 03/05/2019 0912   HGBUR NEGATIVE 03/05/2019 0912   BILIRUBINUR NEGATIVE 03/05/2019 0912   KETONESUR NEGATIVE 03/05/2019 0912   PROTEINUR NEGATIVE 03/05/2019 0912   UROBILINOGEN 0.2 01/27/2008 0235   NITRITE NEGATIVE 03/05/2019 0912   LEUKOCYTESUR NEGATIVE 03/05/2019 0912     STUDIES: No results found.   ELIGIBLE FOR AVAILABLE RESEARCH PROTOCOL: no   ASSESSMENT: 49 y.o. [facility] woman status post left breast upper outer quadrant biopsy 10/02/2018 for a clinical T2N0, stage IB invasive lobular breast cancer, grade not  stated, estrogen and progesterone receptor positive, HER-2 not amplified, with an MIB-1 of less than 1%  (1) status post left mastectomy and sentinel lymph node sampling 10/18/2018 for a pT3 pN2, stage IIA invasive lobular breast cancer, grade 2, with close but negative margins  (a) 5 of 9 lymph nodes removed had micrometastatic deposits of tumor  (b) immediate expander placement  (c) definitive silicone implant placement 04/12/2019 with [employer] Smooth Round High Profile Gel 225cc. Ref #[id number].  Serial Number [id number]  (2) chemotherapy consisting of doxorubicin and cyclophosphamide in dose dense fashion x4 started 11/22/2018, completed 01/02/2019, followed by weekly paclitaxel x12 starting 01/15/2019, completed 04/02/2019  (a) received 11 of 12 planned taxol cycles (last cycle d/c'd to accommodate surgery)  (3) adjuvant radiation 05/21/2019 - 07/05/2019  (a) left chest wall / 50.4 Gy in 28 fractions  (b) boost / 10 Gy in 5 fractions  (4) tamoxifen started 08/16/2019  (a) FSH/estradiol levels consistent with menopause 05/09/2019 and 08/01/2019  (5) genetics tive genetic testing 08/16/2019 through the [facility] Common Hereditary Cancers Panel found no deleterious mutations in APC, ATM, AXIN2, BARD1, BMPR1A, BRCA1, BRCA2, BRIP1, CDH1, CDKN2A (p14ARF), CDKN2A (p16INK4a), CKD4, CHEK2, CTNNA1, DICER1, EPCAM  (Deletion/duplication testing only), GREM1 (promoter region deletion/duplication testing only), KIT, MEN1, MLH1, MSH2, MSH3, MSH6, MUTYH, NBN, NF1, NHTL1, PALB2, PDGFRA, PMS2, POLD1, POLE, PTEN, RAD50, RAD51C, RAD51D, RNF43, SDHB, SDHC, SDHD, SMAD4, SMARCA4. STK11, TP53, TSC1, TSC2, and VHL.  The following genes were evaluated for sequence changes only: SDHA and HOXB13 c.251G>A variant only.  (a) VUS in MLH1 called c.1874A>G identified  PLAN: Please note I had to sign out of this note prematurely so that the image could be reviewed by radiology.  I am completing the note now  I discussed the MRI results with Dr. Purcell Nails who read the MRI and he reviewed it after looking at the image above so he could tell exactly where the new lesion is.  After very careful review he sees absolutely nothing on the MRI in that area so presumably this is a new problem.  I discussed it with Dr. Tedra Coupe him and she will see the patient next week but she suggested we do a fine-needle aspiration of this.  I am going to set that up through [facility].  Otherwise [name] will drop by to see me without an appointment on 02/12/2020 to see if the lesion remains there or to discuss results of the FNA if that does get done  She already has an appointment with me in October for routine follow-up.   Johnathyn Viscomi, Virgie Dad, MD  02/01/20 2:48 PM Medical Oncology and Hematology [facility] [place], [place] [address] Tel. [phone]    Fax. [phone]   I, Jacqualyn Posey am acting as a Education administrator for Chauncey Cruel, MD.       *Total Encounter Time as defined by the Centers for Medicare and Medicaid Services includes, in addition to the face-to-face time of a patient visit (documented in the note above) non-face-to-face time: obtaining and reviewing outside history, ordering and reviewing medications, tests or procedures, care coordination (communications with other health care professionals or  caregivers) and documentation in the medical record.

## 2020-02-04 ENCOUNTER — Telehealth: Payer: Self-pay | Admitting: Oncology

## 2020-02-04 NOTE — Telephone Encounter (Signed)
No 7/16 los, no changes made to pt schedule

## 2020-02-05 ENCOUNTER — Encounter: Payer: Self-pay | Admitting: Plastic Surgery

## 2020-02-05 ENCOUNTER — Ambulatory Visit (INDEPENDENT_AMBULATORY_CARE_PROVIDER_SITE_OTHER): Payer: 59 | Admitting: Plastic Surgery

## 2020-02-05 ENCOUNTER — Other Ambulatory Visit: Payer: Self-pay

## 2020-02-05 VITALS — BP 110/78 | HR 83 | Temp 97.9°F

## 2020-02-05 DIAGNOSIS — Z9012 Acquired absence of left breast and nipple: Secondary | ICD-10-CM

## 2020-02-05 NOTE — Progress Notes (Signed)
   Subjective:    Patient ID: [name], female    DOB: 07/27/1970, 49 y.o.   MRN: [id number]  The patient is a 49 year old female here for evaluation of her left breast.  She noticed a lump/mass in the upper inner quadrant.  This was a couple of weeks ago.  Since then it is actually gotten a lot smaller.  She brought this up to Dr. Jana Hakim who called me.  We got her in right away.  This feels to be a remnant of some fat necrosis.  It does a feet feel to be right above or superior to the implant.  There is no sign of infection or sign of rupture of the implant.  The patient is mostly concerned about it and does not want to live nor it.  I think this is smart.   Review of Systems  Constitutional: Negative.   HENT: Negative.   Respiratory: Negative.   Cardiovascular: Negative.   Musculoskeletal: Negative.        Objective:   Physical Exam Vitals and nursing note reviewed.  Constitutional:      Appearance: Normal appearance.  Cardiovascular:     Rate and Rhythm: Normal rate.     Pulses: Normal pulses.  Pulmonary:     Effort: Pulmonary effort is normal.  Chest:    Neurological:     General: No focal deficit present.     Mental Status: She is alert and oriented to person, place, and time.  Psychiatric:        Mood and Affect: Mood normal.        Behavior: Behavior normal.         Assessment & Plan:     ICD-10-CM   1. Acquired absence of left breast  Z90.12     I have sent a note to Dr. Jana Hakim to see if we can do FNA under ultrasound guidance.  This will depend on if there is anything there at the time of the ultrasound.  The patient acknowledges understanding that.  I also talk with Dr. Jana Hakim last week.  I will keep my follow-up appointment with the patient which is in a couple of months.

## 2020-02-07 ENCOUNTER — Other Ambulatory Visit: Payer: Self-pay | Admitting: Oncology

## 2020-02-07 ENCOUNTER — Other Ambulatory Visit: Payer: Self-pay | Admitting: *Deleted

## 2020-02-07 DIAGNOSIS — Z170 Estrogen receptor positive status [ER+]: Secondary | ICD-10-CM

## 2020-02-07 DIAGNOSIS — C50412 Malignant neoplasm of upper-outer quadrant of left female breast: Secondary | ICD-10-CM

## 2020-02-08 ENCOUNTER — Other Ambulatory Visit: Payer: Self-pay | Admitting: Oncology

## 2020-02-11 ENCOUNTER — Other Ambulatory Visit: Payer: Self-pay | Admitting: Radiology

## 2020-02-13 ENCOUNTER — Encounter: Payer: Self-pay | Admitting: Oncology

## 2020-02-22 ENCOUNTER — Encounter: Payer: Self-pay | Admitting: Oncology

## 2020-04-21 ENCOUNTER — Other Ambulatory Visit: Payer: Self-pay | Admitting: *Deleted

## 2020-04-21 DIAGNOSIS — Z170 Estrogen receptor positive status [ER+]: Secondary | ICD-10-CM

## 2020-04-21 NOTE — Progress Notes (Signed)
[place]  Telephone:([phone]) [phone] Fax:([phone]    ID: [name] DOB: [birth year]  MR#: [id number]  VXY#:[id number]  Patient Care Team: Orpah Melter, MD as PCP - General (Family Medicine) Jovita Kussmaul, MD as Consulting Physician (General Surgery) Bhavya Grand, Virgie Dad, MD as Consulting Physician (Oncology) Kyung Rudd, MD as Consulting Physician (Radiation Oncology) Aloha Gell, MD as Consulting Physician (Obstetrics and Gynecology) [name], Loel Lofty, DO as Attending Physician (Plastic Surgery) Chauncey Cruel, MD OTHER MD:   CHIEF COMPLAINT: Estrogen receptor positive breast cancer  CURRENT TREATMENT: Tamoxifen   INTERVAL HISTORY: [name] returns today for follow-up of her estrogen receptor positive breast cancer.   At her last visit, she presented with a new, small, palpable lump on her left chest wall. She underwent left breast ultrasound on 02/11/2020 at [facility] showing: 0.4 cm hypoechoic mass in left breast at approximately 11 o'clock may represent fat necrosis, however, given it is newly palpable, biopsy is recommended.  She proceeded to biopsy that day. Pathology [phone]) confirmed fat necrosis.  She continues on tamoxifen.  She is occasionally a little "hot feeling" but no real hot flashes anymore.  She has occasional leg cramps.  She does not have a vaginal wetness problem.  REVIEW OF SYSTEMS: [name] had [employer] vaccine x2 and also received a booster last week.  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  Her hair is currently and is getting longer.  He has less white ended than before she says.  She walks between 30 and 45 minutes every evening.  Detailed review of systems today was otherwise benign.   HISTORY OF CURRENT ILLNESS: From the original intake note:  "[name]" presented with left nipple retraction for 1 week with retroareolar mass in the upper outer quadrant. She  underwent bilateral diagnostic mammography with CAD and left breast ultrasonography at [facility] on 09/25/2018 showing: 7 mm oval duct in the left breast; no significant abnormalities in the left axilla. She also underwent additional imaging with left breast digital diagnostic mammogram on 10/02/2018 showing: new 0.4 cm cluster of grouped heterogeneous calcifications in the left breast.   Accordingly on 10/02/2018 she proceeded to biopsy of the left breast area in question. The pathology [id number]) from this procedure showed: mammary carcinoma in situ at the 3 o'clock mass, with possible focal microinvasion; invasive and in situ mammary carcinoma at the 12 o'clock mass, immunostain for E-cadherin is negative in the tumor cells, consistent with a lobular phenotype. Prognostic indicators significant for: estrogen receptor, 90% positive, with moderate staining intensity and progesterone receptor, 100% positive, with strong staining intensity. Proliferation marker Ki67 at <1%. HER2 negative (1+).   The patient's subsequent history is as detailed above.    PAST MEDICAL HISTORY: Past Medical History:  Diagnosis Date  . Cancer ([place]) 10/18/2018   left breast cancer  . Family history of cancer     PAST SURGICAL HISTORY: Past Surgical History:  Procedure Laterality Date  . BREAST RECONSTRUCTION WITH PLACEMENT OF TISSUE EXPANDER AND FLEX HD (ACELLULAR HYDRATED DERMIS) Left 10/18/2018   Procedure: IMMEDIATEVBREAST RECONSTRUCTION WITH PLACEMENT OF TISSUE EXPANDER AND FLEX HD (ACELLULAR HYDRATED DERMIS);  Surgeon: Wallace Going, DO;  Location: [place];  Service: Plastics;  Laterality: Left;  Marland Kitchen MASTECTOMY    . MASTECTOMY W/ SENTINEL NODE BIOPSY Left 10/18/2018   Procedure: LEFT MASTECTOMY WITH SENTINEL LYMPH NODE BIOPSY;  Surgeon: Jovita Kussmaul, MD;  Location: [facility];  Service:  General;  Laterality: Left;  . PORT-A-CATH REMOVAL Right 04/12/2019   Procedure: REMOVAL  PORT-A-CATH;  Surgeon: Wallace Going, DO;  Location: [facility];  Service: Plastics;  Laterality: Right;  . PORTACATH PLACEMENT Right 11/02/2018   Procedure: INSERTION PORT-A-CATH WITH ULTRASOUND;  Surgeon: Jovita Kussmaul, MD;  Location: [place];  Service: General;  Laterality: Right;  . REMOVAL OF TISSUE EXPANDER AND PLACEMENT OF IMPLANT Left 04/12/2019   Procedure: REMOVAL OF TISSUE EXPANDER AND PLACEMENT OF IMPLANT;  Surgeon: Wallace Going, DO;  Location: [place];  Service: Plastics;  Laterality: Left;  90 min, please    FAMILY HISTORY Family History  Problem Relation Age of Onset  . Skin cancer Father        on back  . Skin cancer Paternal Grandmother        on nose  . Breast cancer Neg Hx   . Ovarian cancer Neg Hx    As of March 2020, patient's father is living and healthy at age 30. Patient's mother is also living and healthy at age 36. The patient denies a family hx of breast or ovarian cancer. She has 1 sister.   GYNECOLOGIC HISTORY:  Menarche: 49 years old Age at first live birth: 49 years old [place] P 2 LMP June 2020 Contraceptive: Mirena IUD has been replaced by a ParaGard IUD as of March 2020 She used birth control pills between ages 28 to 84 w/o event HRT n/a  Hysterectomy? no BSO? 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  She has a PhD in Probation officer.  Husband [name] is an Art gallery manager.  They married in 2018.  At home it is the 2 of them and there children. She has two children, [name] age 23 and [name] age 21. Husband [name] has one daughter, Nasim Garofano age 47. ADVANCED DIRECTIVES: In the absence of any documentation to the contrary her husband [name] is her HCPOA.   HEALTH MAINTENANCE: Social History   Tobacco Use  . Smoking status: Never Smoker  . Smokeless tobacco: Never Used  Vaping Use  . Vaping Use: Never used  Substance Use  Topics  . Alcohol use: Yes    Alcohol/week: 3.0 standard drinks    Types: 3 Standard drinks or equivalent per week    Comment: social  . Drug use: Never     Colonoscopy: never done  PAP: 08/2017  Bone density: never done   Allergies  Allergen Reactions  . Septra [Sulfamethoxazole-Trimethoprim]     Current Outpatient Medications  Medication Sig Dispense Refill  . estradiol (ESTRING) 2 MG vaginal ring Place 2 mg vaginally every 3 (three) months. follow package directions 1 each 12  . gabapentin (NEURONTIN) 300 MG capsule Take 1 capsule (300 mg total) by mouth at bedtime. 90 capsule 4  . tamoxifen (NOLVADEX) 20 MG tablet Take 20 mg by mouth daily.     No current facility-administered medications for this visit.    OBJECTIVE: white woman who appears younger than stated age  77:   04/22/20 0918  BP: (!) 92/48  Pulse: 69  Resp: 18  Temp: (!) 97.4 F (36.3 C)  SpO2: 100%   Wt Readings from Last 3 Encounters:  04/22/20 155 lb 1.6 oz (70.4 kg)  02/01/20 154 lb 11.2 oz (70.2 kg)  11/06/19 152 lb 6.4 oz (69.1 kg)   Body mass index is 25.03 kg/m.    ECOG  FS:1 - Symptomatic but completely ambulatory  Sclerae unicteric, EOMs intact Wearing a mask No cervical or supraclavicular adenopathy Lungs no rales or rhonchi Heart regular rate and rhythm Abd soft, nontender, positive bowel sounds MSK no focal spinal tenderness, no upper extremity lymphedema Neuro: nonfocal, well oriented, appropriate affect Breasts: The right breast is unremarkable.  The left breast is status post mastectomy with silicone implant reconstruction.  There is no evidence of local recurrence.  Both axillae are benign.  LAB RESULTS:  CMP     Component Value Date/Time   NA 141 04/22/2020 0844   K 4.3 04/22/2020 0844   CL 108 04/22/2020 0844   CO2 29 04/22/2020 0844   GLUCOSE 102 (H) 04/22/2020 0844   BUN 15 04/22/2020 0844   CREATININE 0.88 04/22/2020 0844   CALCIUM 9.3 04/22/2020 0844   PROT  6.6 04/22/2020 0844   ALBUMIN 3.7 04/22/2020 0844   AST 18 04/22/2020 0844   ALT 18 04/22/2020 0844   ALKPHOS 49 04/22/2020 0844   BILITOT 0.3 04/22/2020 0844   GFRNONAA >60 04/22/2020 0844   GFRAA >60 10/11/2019 0751    No results found for: TOTALPROTELP, ALBUMINELP, A1GS, A2GS, BETS, BETA2SER, GAMS, MSPIKE, SPEI  No results found for: KPAFRELGTCHN, LAMBDASER, KAPLAMBRATIO  Lab Results  Component Value Date   WBC 3.7 (L) 04/22/2020   NEUTROABS 1.9 04/22/2020   HGB 13.5 04/22/2020   HCT 41.1 04/22/2020   MCV 94.3 04/22/2020   PLT 247 04/22/2020    No results found for: LABCA2  No components found for: GEZMOQ947  No results for input(s): INR in the last 168 hours.  No results found for: LABCA2  No results found for: MLY650  No results found for: PTW656  No results found for: CLE751  No results found for: CA2729  No components found for: HGQUANT  No results found for: CEA1 / No results found for: CEA1   No results found for: AFPTUMOR  No results found for: CHROMOGRNA  No results found for: HGBA, HGBA2QUANT, HGBFQUANT, HGBSQUAN (Hemoglobinopathy evaluation)   No results found for: LDH  No results found for: IRON, TIBC, IRONPCTSAT (Iron and TIBC)  No results found for: FERRITIN  Urinalysis    Component Value Date/Time   COLORURINE YELLOW 03/05/2019 0912   APPEARANCEUR CLEAR 03/05/2019 0912   LABSPEC 1.017 03/05/2019 0912   PHURINE 5.0 03/05/2019 0912   GLUCOSEU NEGATIVE 03/05/2019 0912   HGBUR NEGATIVE 03/05/2019 0912   BILIRUBINUR NEGATIVE 03/05/2019 0912   KETONESUR NEGATIVE 03/05/2019 0912   PROTEINUR NEGATIVE 03/05/2019 0912   UROBILINOGEN 0.2 01/27/2008 0235   NITRITE NEGATIVE 03/05/2019 0912   LEUKOCYTESUR NEGATIVE 03/05/2019 0912    STUDIES: No results found.    ELIGIBLE FOR AVAILABLE RESEARCH PROTOCOL: no   ASSESSMENT: 49 y.o. [facility] woman status post left breast upper outer quadrant biopsy 10/02/2018 for a clinical T2N0, stage  IB invasive lobular breast cancer, grade not stated, estrogen and progesterone receptor positive, HER-2 not amplified, with an MIB-1 of less than 1%  (1) status post left mastectomy and sentinel lymph node sampling 10/18/2018 for a pT3 pN2, stage IIA invasive lobular breast cancer, grade 2, with close but negative margins  (a) 5 of 9 lymph nodes removed had micrometastatic deposits of tumor  (b) immediate expander placement  (c) definitive silicone implant placement 04/12/2019 with Mentor Smooth Round High Profile Gel 225cc. Ref #[id number].  Serial Number [id number]  (2) chemotherapy consisting of doxorubicin and cyclophosphamide in dose dense fashion x4 started 11/22/2018, completed  01/02/2019, followed by weekly paclitaxel x12 starting 01/15/2019, completed 04/02/2019  (a) received 11 of 12 planned taxol cycles (last cycle d/c'd to accommodate surgery)  (3) adjuvant radiation 05/21/2019 - 07/05/2019  (a) left chest wall / 50.4 Gy in 28 fractions  (b) boost / 10 Gy in 5 fractions  (4) tamoxifen started 08/16/2019  (a) FSH/estradiol levels consistent with menopause 05/09/2019 and 08/01/2019  (5) genetics tive genetic testing 08/16/2019 through the [facility] Common Hereditary Cancers Panel found no deleterious mutations in APC, ATM, AXIN2, BARD1, BMPR1A, BRCA1, BRCA2, BRIP1, CDH1, CDKN2A (p14ARF), CDKN2A (p16INK4a), CKD4, CHEK2, CTNNA1, DICER1, EPCAM (Deletion/duplication testing only), GREM1 (promoter region deletion/duplication testing only), KIT, MEN1, MLH1, MSH2, MSH3, MSH6, MUTYH, NBN, NF1, NHTL1, PALB2, PDGFRA, PMS2, POLD1, POLE, PTEN, RAD50, RAD51C, RAD51D, RNF43, SDHB, SDHC, SDHD, SMAD4, SMARCA4. STK11, TP53, TSC1, TSC2, and VHL.  The following genes were evaluated for sequence changes only: SDHA and HOXB13 c.251G>A variant only.  (a) VUS in [place] called c.[id number] identified   PLAN: [name] is now 1-1/2 years out from definitive surgery for her breast cancer with no evidence of disease  recurrence.  This is very favorable.  She is tolerating tamoxifen well and the plan is to continue that a minimum of 5 years, with consideration of switching to an aromatase inhibitor after that.  Her [place] and estradiol have been low and if they are low at this determination that will mean that she is indeed in permanent menopause.  We will not check those further then.  She has done a wonderful job at reentering life normally.  She has an excellent functional status, is enjoying her family and work, and is appropriately but not overly concerned regarding possible recurrence.  She is exercising regularly.  This is all optimal  She will see me again April 2022 and yearly thereafter  Total encounter time 25 minutes.*    Analea Muller, Virgie Dad, MD  04/22/20 9:51 AM Medical Oncology and Hematology [facility] [place], [place] [address] Tel. [phone]    Fax. [phone]   I, Wilburn Mylar, am acting as scribe for Dr. Virgie Dad. Byrdie Miyazaki.  I, Lurline Del MD, have reviewed the above documentation for accuracy and completeness, and I agree with the above.    *Total Encounter Time as defined by the Centers for Medicare and Medicaid Services includes, in addition to the face-to-face time of a patient visit (documented in the note above) non-face-to-face time: obtaining and reviewing outside history, ordering and reviewing medications, tests or procedures, care coordination (communications with other health care professionals or caregivers) and documentation in the medical record.

## 2020-04-22 ENCOUNTER — Other Ambulatory Visit: Payer: Self-pay

## 2020-04-22 ENCOUNTER — Inpatient Hospital Stay: Payer: 59

## 2020-04-22 ENCOUNTER — Inpatient Hospital Stay: Payer: 59 | Attending: Oncology | Admitting: Oncology

## 2020-04-22 VITALS — BP 92/48 | HR 69 | Temp 97.4°F | Resp 18 | Ht 66.0 in | Wt 155.1 lb

## 2020-04-22 DIAGNOSIS — Z170 Estrogen receptor positive status [ER+]: Secondary | ICD-10-CM

## 2020-04-22 DIAGNOSIS — Z9221 Personal history of antineoplastic chemotherapy: Secondary | ICD-10-CM | POA: Insufficient documentation

## 2020-04-22 DIAGNOSIS — Z923 Personal history of irradiation: Secondary | ICD-10-CM | POA: Insufficient documentation

## 2020-04-22 DIAGNOSIS — Z9012 Acquired absence of left breast and nipple: Secondary | ICD-10-CM | POA: Diagnosis not present

## 2020-04-22 DIAGNOSIS — C50412 Malignant neoplasm of upper-outer quadrant of left female breast: Secondary | ICD-10-CM | POA: Diagnosis present

## 2020-04-22 LAB — CMP (CANCER CENTER ONLY)
ALT: 18 U/L (ref 0–44)
AST: 18 U/L (ref 15–41)
Albumin: 3.7 g/dL (ref 3.5–5.0)
Alkaline Phosphatase: 49 U/L (ref 38–126)
Anion gap: 4 — ABNORMAL LOW (ref 5–15)
BUN: 15 mg/dL (ref 6–20)
CO2: 29 mmol/L (ref 22–32)
Calcium: 9.3 mg/dL (ref 8.9–10.3)
Chloride: 108 mmol/L (ref 98–111)
Creatinine: 0.88 mg/dL (ref 0.44–1.00)
GFR, Estimated: >60 mL/min (ref >60)
Glucose, Bld: 102 mg/dL — ABNORMAL HIGH (ref 70–99)
Potassium: 4.3 mmol/L (ref 3.5–5.1)
Sodium: 141 mmol/L (ref 135–145)
Total Bilirubin: 0.3 mg/dL (ref 0.3–1.2)
Total Protein: 6.6 g/dL (ref 6.5–8.1)

## 2020-04-22 LAB — CBC WITH DIFFERENTIAL (CANCER CENTER ONLY)
Abs Immature Granulocytes: 0.01 10*3/uL (ref 0.00–0.07)
Basophils Absolute: 0 10*3/uL (ref 0.0–0.1)
Basophils Relative: 1 %
Eosinophils Absolute: 0.1 10*3/uL (ref 0.0–0.5)
Eosinophils Relative: 2 %
HCT: 41.1 % (ref 36.0–46.0)
Hemoglobin: 13.5 g/dL (ref 12.0–15.0)
Immature Granulocytes: 0 %
Lymphocytes Relative: 33 %
Lymphs Abs: 1.2 10*3/uL (ref 0.7–4.0)
MCH: 31 pg (ref 26.0–34.0)
MCHC: 32.8 g/dL (ref 30.0–36.0)
MCV: 94.3 fL (ref 80.0–100.0)
Monocytes Absolute: 0.5 10*3/uL (ref 0.1–1.0)
Monocytes Relative: 13 %
Neutro Abs: 1.9 10*3/uL (ref 1.7–7.7)
Neutrophils Relative %: 51 %
Platelet Count: 247 10*3/uL (ref 150–400)
RBC: 4.36 MIL/uL (ref 3.87–5.11)
RDW: 12.5 % (ref 11.5–15.5)
WBC Count: 3.7 10*3/uL — ABNORMAL LOW (ref 4.0–10.5)
nRBC: 0 % (ref 0.0–0.2)

## 2020-04-23 LAB — FOLLICLE STIMULATING HORMONE: FSH: 46.7 m[IU]/mL

## 2020-04-25 LAB — ESTRADIOL, ULTRA SENS: Estradiol, Sensitive: 3.7 pg/mL

## 2020-05-06 ENCOUNTER — Other Ambulatory Visit: Payer: Self-pay

## 2020-05-06 ENCOUNTER — Ambulatory Visit (INDEPENDENT_AMBULATORY_CARE_PROVIDER_SITE_OTHER): Payer: 59 | Admitting: Plastic Surgery

## 2020-05-06 ENCOUNTER — Encounter: Payer: Self-pay | Admitting: Plastic Surgery

## 2020-05-06 VITALS — BP 112/77 | HR 87 | Temp 97.8°F | Wt 155.0 lb

## 2020-05-06 DIAGNOSIS — Z9012 Acquired absence of left breast and nipple: Secondary | ICD-10-CM

## 2020-05-06 NOTE — Progress Notes (Signed)
The patient is a 49 year old female here for follow-up on her left breast reconstruction.  She had a biopsy of a concerning area of the upper inner breast.  Fortunately that came back as fat necrosis and is negative.  She may be interested in lipofilling of the upper inner left breast and tattoo placement.  She would like to give it more time.  May be do it sometime at the beginning of next year.  She will also continue to massage the breast down and inward.  I will see her back in January.

## 2020-07-21 ENCOUNTER — Encounter: Payer: Self-pay | Admitting: Oncology

## 2020-07-23 ENCOUNTER — Encounter: Payer: Self-pay | Admitting: Oncology

## 2020-07-24 ENCOUNTER — Telehealth: Payer: Self-pay | Admitting: *Deleted

## 2020-07-24 NOTE — Telephone Encounter (Signed)
This RN entered order for Nicole Mccreedy NP with GYN/ONC for this pt to have an MRI of her breast secondary to probable rupture of implants.

## 2020-08-01 ENCOUNTER — Other Ambulatory Visit: Payer: Self-pay

## 2020-08-01 ENCOUNTER — Encounter: Payer: Self-pay | Admitting: Plastic Surgery

## 2020-08-01 ENCOUNTER — Ambulatory Visit (INDEPENDENT_AMBULATORY_CARE_PROVIDER_SITE_OTHER): Payer: 59 | Admitting: Plastic Surgery

## 2020-08-01 VITALS — BP 115/70 | HR 87

## 2020-08-01 DIAGNOSIS — Z9889 Other specified postprocedural states: Secondary | ICD-10-CM | POA: Diagnosis not present

## 2020-08-01 NOTE — Progress Notes (Signed)
   Subjective:    Patient ID: [name], female    DOB: [birth year], 50 y.o.   MRN: [id number]  Patient is a 50 year old female here for follow-up on her breast reconstruction.  She underwent a left mastectomy with reconstruction she has a Mentor high profile to 225 cc gel implant in place.  A few months ago she had a concerning area in the left upper inner breast.  That was biopsied and was fat necrosis.  Now she is doing well she continues to massage the breast.  She is still interested in possible Lipo filling and nipple areola tattoo placement on the left.  Also a possible mastopexy of the right side.  She has had a 20 pound weight gain since her diagnosis and she is good to try to get that extra weight off.   Review of Systems  Constitutional: Negative.  Negative for activity change and appetite change.  HENT: Negative.   Eyes: Negative.   Respiratory: Negative.   Cardiovascular: Negative.   Gastrointestinal: Negative.   Genitourinary: Negative.   Musculoskeletal: Negative.        Objective:   Physical Exam Vitals and nursing note reviewed.  Constitutional:      Appearance: Normal appearance.  HENT:     Head: Normocephalic and atraumatic.  Cardiovascular:     Rate and Rhythm: Normal rate.     Pulses: Normal pulses.  Pulmonary:     Effort: Pulmonary effort is normal.  Skin:    General: Skin is warm.     Capillary Refill: Capillary refill takes less than 2 seconds.  Neurological:     Mental Status: She is alert. Mental status is at baseline.  Psychiatric:        Mood and Affect: Mood normal.        Behavior: Behavior normal.        Thought Content: Thought content normal.         Assessment & Plan:     ICD-10-CM   1. S/P breast reconstruction  Z98.890      The patient would like to wait for now before any intervention.  I think that is reasonable and we will plan to see her back in 6 months.  Continue massage. Pictures were obtained of the patient and  placed in the chart with the patient's or guardian's permission.

## 2020-09-04 ENCOUNTER — Other Ambulatory Visit: Payer: Self-pay | Admitting: Oncology

## 2020-09-10 ENCOUNTER — Other Ambulatory Visit: Payer: Self-pay

## 2020-09-10 MED ORDER — ESTRADIOL 0.1 MG/GM VA CREA
1.0000 | TOPICAL_CREAM | VAGINAL | 0 refills | Status: DC
Start: 2020-09-10 — End: 2020-10-16

## 2020-09-10 NOTE — Progress Notes (Signed)
Due to insurance deny Estring 2 mg Vaginal Ring, MD changed medication to Estradiol cream 3 times weekly.    RN sent Rx to pharmacy - Left voicemail for patient to call back to update on change.

## 2020-09-28 ENCOUNTER — Encounter: Payer: Self-pay | Admitting: Oncology

## 2020-09-29 ENCOUNTER — Telehealth: Payer: Self-pay

## 2020-09-29 NOTE — Telephone Encounter (Signed)
Contacted pt to let her know the order for her mammogram was in and she could call [facility] and get it scheduled. Pt verbalized understanding.

## 2020-10-16 ENCOUNTER — Other Ambulatory Visit: Payer: Self-pay | Admitting: Oncology

## 2020-10-27 NOTE — Progress Notes (Signed)
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   She continues on tamoxifen.  She is occasionally a little "hot feeling" but no real hot flashes anymore.  She still has occasional leg cramps.   REVIEW OF SYSTEMS: [name] overall "feel really good".  She feels healthy.  Work is "fine".  Her parents are coming to visit from [place] and she is very much looking forward to that.  She walks between mile and a half and 2 miles daily.  She is using Estrace since her insurance would not pay for Estring.  The cream is causing some problems and she may end up being intolerant to it and have to appeal to see if she can get the company to pay for Estring, which was working well before..  Aside from these issues a detailed review of systems today was stable   COVID 19 VACCINATION STATUS: fully vaccinated [employer]), with booster 03/2020   HISTORY OF CURRENT ILLNESS: From the original intake note:  "[name]" presented with left nipple retraction for 1 week with retroareolar mass in the upper outer quadrant. She underwent bilateral diagnostic mammography with CAD and left breast ultrasonography at [facility] on 09/25/2018 showing: 7 mm oval duct in the left breast; no  significant abnormalities in the left axilla. She also underwent additional imaging with left breast digital diagnostic mammogram on 10/02/2018 showing: new 0.4 cm cluster of grouped heterogeneous calcifications in the left breast.   Accordingly on 10/02/2018 she proceeded to biopsy of the left breast area in question. The pathology [id number]) from this procedure showed: mammary carcinoma in situ at the 3 o'clock mass, with possible focal microinvasion; invasive and in situ mammary carcinoma at the 12 o'clock mass, immunostain for E-cadherin is negative in the tumor cells, consistent with a lobular phenotype. Prognostic indicators significant for: estrogen receptor, 90% positive, with moderate staining intensity and progesterone receptor, 100% positive, with strong staining intensity. Proliferation marker Ki67 at <1%. HER2 negative (1+).   The patient's subsequent history is as detailed above.    PAST MEDICAL HISTORY: Past Medical History:  Diagnosis Date  . Cancer ([place]) 10/18/2018   left breast cancer  . Family history of cancer     PAST SURGICAL HISTORY: Past Surgical History:  Procedure Laterality Date  . BREAST RECONSTRUCTION WITH PLACEMENT OF TISSUE EXPANDER AND FLEX HD (ACELLULAR HYDRATED DERMIS) Left 10/18/2018   Procedure: IMMEDIATEVBREAST RECONSTRUCTION WITH PLACEMENT OF TISSUE EXPANDER AND FLEX HD (ACELLULAR HYDRATED DERMIS);  Surgeon: Wallace Going, DO;  Location: [place];  Service: Plastics;  Laterality: Left;  Marland Kitchen MASTECTOMY    . MASTECTOMY W/ SENTINEL NODE BIOPSY Left 10/18/2018   Procedure: LEFT MASTECTOMY WITH SENTINEL LYMPH NODE BIOPSY;  Surgeon: Jovita Kussmaul, MD;  Location: [place];  Service: General;  Laterality: Left;  . PORT-A-CATH REMOVAL Right 04/12/2019   Procedure: REMOVAL PORT-A-CATH;  Surgeon: Wallace Going, DO;  Location: [place];  Service: Plastics;  Laterality: Right;  . PORTACATH PLACEMENT Right  11/02/2018   Procedure: INSERTION PORT-A-CATH WITH ULTRASOUND;  Surgeon: Jovita Kussmaul, MD;  Location: [place];  Service: General;  Laterality: Right;  . REMOVAL OF TISSUE EXPANDER AND PLACEMENT OF IMPLANT Left 04/12/2019   Procedure: REMOVAL OF TISSUE EXPANDER AND PLACEMENT OF IMPLANT;  Surgeon: Wallace Going, DO;  Location: [place];  Service: Plastics;  Laterality: Left;  90 min, please    FAMILY HISTORY Family History  Problem Relation Age of Onset  . Skin cancer Father        on back  . Skin cancer Paternal Grandmother        on nose  . Breast cancer Neg Hx   . Ovarian cancer Neg Hx   As of March 2020, patient's father is living and healthy at age 52. Patient's mother is also living and healthy at age 38. The patient denies a family hx of breast or ovarian cancer. She has 1 sister.   GYNECOLOGIC HISTORY:  Menarche: 50 years old Age at first live birth: 50 years old [name] 2 LMP June 2020 Contraceptive: Mirena IUD has been replaced by a ParaGard IUD as of March 2020 She used birth control pills between ages 60 to 55 w/o event HRT n/a  Hysterectomy? no BSO? 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  Husband [name] is an Art gallery manager.  They married in 2018.  At home it is the 2 of them and their children. She has two children, [name] age 5 and [name] age 27. Husband [name] has one daughter, [name] age 3.  ADVANCED DIRECTIVES: In the absence of any documentation to the contrary her husband [name] is her HCPOA.   HEALTH MAINTENANCE: Social History   Tobacco Use  . Smoking status: Never Smoker  . Smokeless tobacco: Never Used  Vaping Use  . Vaping Use: Never used  Substance Use Topics  . Alcohol use: Yes    Alcohol/week: 3.0 standard drinks    Types: 3 Standard drinks or equivalent per week    Comment: social  . Drug use: Never      Colonoscopy:   PAP: 08/2017  Bone density:    Allergies  Allergen Reactions  . Septra [Sulfamethoxazole-Trimethoprim]     Current Outpatient Medications  Medication Sig Dispense Refill  . estradiol (ESTRACE) 0.1 MG/GM vaginal cream PLACE 1 APPLICATORFUL VAGINALLY 3 (THREE) TIMES A WEEK. 42.5 g 0  . tamoxifen (NOLVADEX) 20 MG tablet Take 20 mg by mouth daily.     No current facility-administered medications for this visit.    OBJECTIVE: white woman who appears younger than stated age  42:   10/28/20 1101  BP: 118/61  Pulse: 75  Resp: 18  Temp: 97.7 F (36.5 C)  SpO2: 100%   Wt Readings from Last 3 Encounters:  10/28/20 157 lb (71.2 kg)  05/06/20 155 lb (70.3 kg)  04/22/20 155 lb 1.6 oz (70.4 kg)   Body mass index is 25.34 kg/m.    ECOG FS:1 - Symptomatic but completely ambulatory  Hair is longer and still quite curly, though the more recent hair is coming in straight  Sclerae unicteric, EOMs intact Wearing a mask No cervical or supraclavicular adenopathy Lungs no rales or rhonchi Heart regular rate and rhythm  Abd soft, nontender, positive bowel sounds MSK no focal spinal tenderness, no upper extremity lymphedema Neuro: nonfocal, well oriented, appropriate affect Breasts: The right breast is benign.  The left breast is status post mastectomy with silicone implant reconstruction.  There is no evidence of recurrence.  Both axillae are benign.   LAB RESULTS:  CMP     Component Value Date/Time   NA 141 04/22/2020 0844   K 4.3 04/22/2020 0844   CL 108 04/22/2020 0844   CO2 29 04/22/2020 0844   GLUCOSE 102 (H) 04/22/2020 0844   BUN 15 04/22/2020 0844   CREATININE 0.88 04/22/2020 0844   CALCIUM 9.3 04/22/2020 0844   PROT 6.6 04/22/2020 0844   ALBUMIN 3.7 04/22/2020 0844   AST 18 04/22/2020 0844   ALT 18 04/22/2020 0844   ALKPHOS 49 04/22/2020 0844   BILITOT 0.3 04/22/2020 0844   GFRNONAA >60 04/22/2020 0844   GFRAA >60 10/11/2019 0751    No  results found for: TOTALPROTELP, ALBUMINELP, A1GS, A2GS, BETS, BETA2SER, GAMS, MSPIKE, SPEI  No results found for: KPAFRELGTCHN, LAMBDASER, KAPLAMBRATIO  Lab Results  Component Value Date   WBC 3.7 (L) 04/22/2020   NEUTROABS 1.9 04/22/2020   HGB 13.5 04/22/2020   HCT 41.1 04/22/2020   MCV 94.3 04/22/2020   PLT 247 04/22/2020    No results found for: LABCA2  No components found for: AYOKHT977  No results for input(s): INR in the last 168 hours.  No results found for: LABCA2  No results found for: SFS239  No results found for: RVU023  No results found for: XID568  No results found for: CA2729  No components found for: HGQUANT  No results found for: CEA1 / No results found for: CEA1   No results found for: AFPTUMOR  No results found for: CHROMOGRNA  No results found for: HGBA, HGBA2QUANT, HGBFQUANT, HGBSQUAN (Hemoglobinopathy evaluation)   No results found for: LDH  No results found for: IRON, TIBC, IRONPCTSAT (Iron and TIBC)  No results found for: FERRITIN  Urinalysis    Component Value Date/Time   COLORURINE YELLOW 03/05/2019 0912   APPEARANCEUR CLEAR 03/05/2019 0912   LABSPEC 1.017 03/05/2019 0912   PHURINE 5.0 03/05/2019 0912   GLUCOSEU NEGATIVE 03/05/2019 0912   HGBUR NEGATIVE 03/05/2019 0912   BILIRUBINUR NEGATIVE 03/05/2019 0912   KETONESUR NEGATIVE 03/05/2019 0912   PROTEINUR NEGATIVE 03/05/2019 0912   UROBILINOGEN 0.2 01/27/2008 0235   NITRITE NEGATIVE 03/05/2019 0912   LEUKOCYTESUR NEGATIVE 03/05/2019 0912    STUDIES: No results found.    ELIGIBLE FOR AVAILABLE RESEARCH PROTOCOL: no   ASSESSMENT: 32 y.o. [facility] woman status post left breast upper outer quadrant biopsy 10/02/2018 for a clinical T2N0, stage IB invasive lobular breast cancer, grade not stated, estrogen and progesterone receptor positive, HER-2 not amplified, with an MIB-1 of less than 1%  (1) status post left mastectomy and sentinel lymph node sampling 10/18/2018 for a  pT3 pN2, stage IIA invasive lobular breast cancer, grade 2, with close but negative margins  (a) 5 of 9 lymph nodes removed had micrometastatic deposits of tumor  (b) immediate expander placement  (c) definitive silicone implant placement 04/12/2019 with [employer]or Smooth Round High Profile Gel 225cc. Ref #[id number].  Serial Number [id number]  (2) chemotherapy consisting of doxorubicin and cyclophosphamide in dose dense fashion x4 started 11/22/2018, completed 01/02/2019, followed by weekly paclitaxel x12 starting 01/15/2019, completed 04/02/2019  (a) received 11 of 12 planned taxol cycles (last cycle d/c'd to accommodate surgery)  (3) adjuvant radiation  05/21/2019 - 07/05/2019  (a) left chest wall / 50.4 Gy in 28 fractions  (b) boost / 10 Gy in 5 fractions  (4) tamoxifen started 08/16/2019  (a) FSH/estradiol levels consistent with menopause 05/09/2019,  08/01/2019 and 04/22/2020  (5) genetics tive genetic testing 08/16/2019 through the [facility] Common Hereditary Cancers Panel found no deleterious mutations in APC, ATM, AXIN2, BARD1, BMPR1A, BRCA1, BRCA2, BRIP1, CDH1, CDKN2A (p14ARF), CDKN2A (p16INK4a), CKD4, CHEK2, CTNNA1, DICER1, EPCAM (Deletion/duplication testing only), GREM1 (promoter region deletion/duplication testing only), KIT, MEN1, MLH1, MSH2, MSH3, MSH6, MUTYH, NBN, NF1, NHTL1, PALB2, PDGFRA, PMS2, POLD1, POLE, PTEN, RAD50, RAD51C, RAD51D, RNF43, SDHB, SDHC, SDHD, SMAD4, SMARCA4. STK11, TP53, TSC1, TSC2, and VHL.  The following genes were evaluated for sequence changes only: SDHA and HOXB13 c.251G>A variant only.  (a) VUS in [place] called c.1874A>G identified   PLAN: Lucely is now 2 years out from definitive surgery for breast cancer with no evidence of disease recurrence.  This is very favorable.  She is tolerating tamoxifen and the plan is to continue that a minimum of 5 years.  She does continue to have problems with vaginal dryness.  Vaginal estrogen is helping.  I feel comfortable  using this while she is on tamoxifen and of course she may choose to continue tamoxifen after 10 years.  She is aware of concerns regarding blood clots and endometrial hyperplasia and the rare cases of Dimitriu cancer associated with estrogen use (as well as tamoxifen).  If she is unable to tolerate Estrace we can apply again to her insurance to try to get Estring, which was working better for her  At this point I feel comfortable with her seeing [place] once a year.  She knows to call us for any other issue that may develop before that visit  Total encounter time 25 minutes.*   Mady Oubre, Virgie Dad, MD  10/28/20 8:34 PM Medical Oncology and Hematology [facility] [place], [place] [address] Tel. [phone]    Fax. [phone]   I, Wilburn Mylar, am acting as scribe for Dr. Virgie Dad. Aron Needles.  I, Lurline Del MD, have reviewed the above documentation for accuracy and completeness, and I agree with the above.   *Total Encounter Time as defined by the Centers for Medicare and Medicaid Services includes, in addition to the face-to-face time of a patient visit (documented in the note above) non-face-to-face time: obtaining and reviewing outside history, ordering and reviewing medications, tests or procedures, care coordination (communications with other health care professionals or caregivers) and documentation in the medical record.

## 2020-10-28 ENCOUNTER — Inpatient Hospital Stay: Payer: 59

## 2020-10-28 ENCOUNTER — Inpatient Hospital Stay: Payer: 59 | Attending: Oncology | Admitting: Oncology

## 2020-10-28 ENCOUNTER — Other Ambulatory Visit: Payer: Self-pay

## 2020-10-28 VITALS — BP 118/61 | HR 75 | Temp 97.7°F | Resp 18 | Ht 66.0 in | Wt 157.0 lb

## 2020-10-28 DIAGNOSIS — N898 Other specified noninflammatory disorders of vagina: Secondary | ICD-10-CM | POA: Diagnosis not present

## 2020-10-28 DIAGNOSIS — Z170 Estrogen receptor positive status [ER+]: Secondary | ICD-10-CM | POA: Insufficient documentation

## 2020-10-28 DIAGNOSIS — C50412 Malignant neoplasm of upper-outer quadrant of left female breast: Secondary | ICD-10-CM | POA: Insufficient documentation

## 2020-10-28 DIAGNOSIS — Z9012 Acquired absence of left breast and nipple: Secondary | ICD-10-CM | POA: Diagnosis not present

## 2020-10-28 DIAGNOSIS — Z9221 Personal history of antineoplastic chemotherapy: Secondary | ICD-10-CM | POA: Insufficient documentation

## 2020-10-28 DIAGNOSIS — Z923 Personal history of irradiation: Secondary | ICD-10-CM | POA: Insufficient documentation

## 2020-10-28 MED ORDER — ESTRADIOL 0.1 MG/GM VA CREA: 1.0000 | TOPICAL_CREAM | VAGINAL | 0 refills | Status: DC

## 2020-10-28 MED ORDER — TAMOXIFEN CITRATE 20 MG PO TABS: 20.0000 mg | ORAL_TABLET | Freq: Every day | ORAL | 4 refills | Status: DC

## 2020-10-29 LAB — FOLLICLE STIMULATING HORMONE: FSH: 42.9 m[IU]/mL

## 2020-10-30 ENCOUNTER — Telehealth: Payer: Self-pay | Admitting: Oncology

## 2020-10-30 NOTE — Telephone Encounter (Signed)
Scheduled per 4/12 los. Called and spoke with pt confirmed 5/18 appts

## 2020-11-03 ENCOUNTER — Encounter: Payer: Self-pay | Admitting: Oncology

## 2020-11-10 ENCOUNTER — Other Ambulatory Visit: Payer: Self-pay | Admitting: Oncology

## 2020-11-10 LAB — ESTRADIOL, ULTRA SENS: Estradiol, Sensitive: 51.3 pg/mL

## 2020-12-19 ENCOUNTER — Other Ambulatory Visit: Payer: Self-pay

## 2020-12-19 ENCOUNTER — Encounter: Payer: Self-pay | Admitting: Plastic Surgery

## 2020-12-19 ENCOUNTER — Ambulatory Visit (INDEPENDENT_AMBULATORY_CARE_PROVIDER_SITE_OTHER): Payer: 59 | Admitting: Plastic Surgery

## 2020-12-19 ENCOUNTER — Ambulatory Visit: Payer: 59 | Admitting: Plastic Surgery

## 2020-12-19 DIAGNOSIS — C50412 Malignant neoplasm of upper-outer quadrant of left female breast: Secondary | ICD-10-CM

## 2020-12-19 DIAGNOSIS — Z170 Estrogen receptor positive status [ER+]: Secondary | ICD-10-CM

## 2020-12-19 DIAGNOSIS — Z9889 Other specified postprocedural states: Secondary | ICD-10-CM | POA: Diagnosis not present

## 2020-12-19 NOTE — Progress Notes (Signed)
   Subjective:    Patient ID: [name], female    DOB: 10-Apr-1971, 50 y.o.   MRN: [id number]  The patient is a 50 year old female here for follow-up on her left breast reconstruction.  She had a left mastectomy and reconstruction.  She has a Product manager high profile 225 cc gel implant in place.  There was concern about a firm area in the left upper inner breast.  This showed fat necrosis with biopsy.  She has been massaging it and it seems to be doing better.  She might be interested in Lipo filling in tattooing but down the road.  She is doing well to try and decrease her weight since starting the chemo.  Her hair is growing back.  Overall she is feeling really good.  There are no areas of concern in her breast.   Review of Systems  Constitutional: Negative.   HENT: Negative.   Eyes: Negative.   Respiratory: Negative.   Cardiovascular: Negative.   Gastrointestinal: Negative.   Endocrine: Negative.   Genitourinary: Negative.   Musculoskeletal: Negative.   Hematological: Negative.   Psychiatric/Behavioral: Negative.        Objective:   Physical Exam Vitals and nursing note reviewed.  Constitutional:      Appearance: Normal appearance.  HENT:     Head: Normocephalic and atraumatic.  Cardiovascular:     Rate and Rhythm: Normal rate.     Pulses: Normal pulses.  Pulmonary:     Effort: Pulmonary effort is normal. No respiratory distress.     Breath sounds: No stridor.  Abdominal:     General: Abdomen is flat. There is no distension.  Neurological:     General: No focal deficit present.     Mental Status: She is alert. Mental status is at baseline.  Psychiatric:        Mood and Affect: Mood normal.        Behavior: Behavior normal.        Thought Content: Thought content normal.         Assessment & Plan:     ICD-10-CM   1. S/P breast reconstruction  Z98.890   2. Malignant neoplasm of upper-outer quadrant of left breast in female, estrogen receptor positive ([place])   C50.412    Z17.0     Continue with massage to the breast with trying to push it down and and on the left side.  Follow-up in 1 year.  She knows to call if she has any questions or concerns in the meantime. Pictures were obtained of the patient and placed in the chart with the patient's or guardian's permission.

## 2021-02-09 ENCOUNTER — Other Ambulatory Visit: Payer: Self-pay | Admitting: Oncology

## 2021-10-28 ENCOUNTER — Other Ambulatory Visit: Payer: 59

## 2021-11-26 NOTE — Progress Notes (Signed)
Patient Care Team: Orpah Melter, MD as PCP - General (Family Medicine) Jovita Kussmaul, MD as Consulting Physician (General Surgery) Magrinat, Virgie Dad, MD (Inactive) as Consulting Physician (Oncology) Kyung Rudd, MD as Consulting Physician (Radiation Oncology) Aloha Gell, MD as Consulting Physician (Obstetrics and Gynecology) [name] Loel Lofty, DO as Attending Physician (Plastic Surgery)  DIAGNOSIS:  Encounter Diagnosis  Name Primary?   Malignant neoplasm of upper-outer quadrant of left breast in female, estrogen receptor positive ([place])     SUMMARY OF ONCOLOGIC HISTORY: Oncology History  Malignant neoplasm of upper-outer quadrant of left breast in female, estrogen receptor positive ([place])  10/10/2018 Initial Diagnosis   Malignant neoplasm of upper-outer quadrant of left breast in female, estrogen receptor positive ([place])    10/18/2018 Surgery   Left mastectomy Marlou Starks) [phone]): Invasive Lobular Carcinoma, 7.8 cm, grade 2, negative margins. ER+, PR+, HER2-, with an MIB-1 of less than 1%. 5/9 lymph nodes were positive for carcinoma.   11/22/2018 - 04/02/2019 Chemotherapy   DOXOrubicin (ADRIAMYCIN) chemo injection 104 mg, 60 mg/m2 = 104 mg, Intravenous,  Once, 4 of 4 cycles.. Administration: 104 mg (11/22/2018), 104 mg (12/05/2018), 104 mg (12/19/2018), 104 mg (01/02/2019)  palonosetron (ALOXI) injection 0.25 mg, 0.25 mg, Intravenous,  Once, 4 of 4 cycles. Administration: 0.25 mg (11/22/2018), 0.25 mg (12/05/2018), 0.25 mg (12/19/2018), 0.25 mg (01/02/2019)  pegfilgrastim (NEULASTA ONPRO KIT) injection 6 mg, 6 mg, Subcutaneous, Once, 4 of 4 cycles. Administration: 6 mg (11/22/2018), 6 mg (12/05/2018), 6 mg (12/19/2018), 6 mg (01/02/2019)  cyclophosphamide (CYTOXAN) 1,040 mg in sodium chloride 0.9 % 250 mL chemo infusion, 600 mg/m2 = 1,040 mg, Intravenous,  Once, 4 of 4 cycles. Administration: 1,040 mg (11/22/2018), 1,040 mg (12/05/2018), 1,040 mg (12/19/2018), 1,040 mg (01/02/2019)  PACLitaxel  (TAXOL) 138 mg in sodium chloride 0.9 % 250 mL chemo infusion (</= 27m/m2), 80 mg/m2 = 138 mg, Intravenous,  Once, 11 of 11 cycles. Administration: 138 mg (01/15/2019), 138 mg (01/22/2019), 138 mg (01/29/2019), 138 mg (02/05/2019), 138 mg (02/12/2019), 138 mg (02/19/2019), 138 mg (02/26/2019), 138 mg (03/05/2019), 138 mg (03/19/2019), 138 mg (03/27/2019), 138 mg (04/02/2019)  fosaprepitant (EMEND) 150 mg  dexamethasone (DECADRON) 12 mg in sodium chloride 0.9 % 145 mL IVPB, , Intravenous,  Once, 4 of 4 cycles. Administration:  (11/22/2018),  (12/05/2018),  (12/19/2018),  (01/02/2019)     05/21/2019 - 06/28/2019 Radiation Therapy   The patient initially received a dose of 50.4 Gy in 28 fractions to the chest wall and supraclavicular region. This was delivered using a 3-D conformal, 4 field technique. The patient then received a boost to the mastectomy scar. This delivered an additional 10 Gy in 5 fractions using an en face electron field. The total dose was 60.4 Gy.   07/2019 - 07/2029 Anti-estrogen oral therapy   Tamoxifen   08/20/2019 Genetic Testing   Negative genetic testing. 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 The report date is 08/16/2019.  The Common Hereditary Cancers Panel offered by [facility] includes sequencing and/or deletion duplication testing of the following 48 genes: APC, ATM, AXIN2, BARD1, BMPR1A, BRCA1, BRCA2, BRIP1, CDH1, CDKN2A (p14ARF), CDKN2A (p16INK4a), CKD4, CHEK2, CTNNA1, DICER1, EPCAM (Deletion/duplication testing only), GREM1 (promoter region deletion/duplication testing only), KIT, MEN1, MLH1, MSH2, MSH3, MSH6, MUTYH, NBN, NF1, NHTL1, PALB2, PDGFRA, PMS2, POLD1, POLE, PTEN, RAD50, RAD51C, RAD51D, RNF43, SDHB, SDHC, SDHD, SMAD4, SMARCA4. STK11, TP53, TSC1, TSC2, and VHL.  The following genes were evaluated for sequence changes only: SDHA and HOXB13 c.251G>A variant only.  CHIEF COMPLIANT: Estrogen receptor positive breast cancer  on  Tamoxifen  INTERVAL HISTORY: Mackinze Criado is a 51 y.o. with the above mention Estrogen receptor positive breast cancer  on Tamoxifen. She presents to the clinic today for a follow-up. She states that the hot flashes is manageable she normally get them in night. Overall she is tolerating the tamoxifen. She states if she don't drink enough water she wakes up with cramps in your legs. She denies mood swings. She states she still is having the vaginal dryness. Denies pain and discomfort in breast.   ALLERGIES:  is allergic to septra [sulfamethoxazole-trimethoprim].  MEDICATIONS:  Current Outpatient Medications  Medication Sig Dispense Refill   estradiol (ESTRACE) 0.1 MG/GM vaginal cream PLACE 1 APPLICATORFUL VAGINALLY 3 (THREE) TIMES A WEEK. 42.5 g 0   tamoxifen (NOLVADEX) 20 MG tablet Take 1 tablet (20 mg total) by mouth daily. 90 tablet 4   No current facility-administered medications for this visit.    PHYSICAL EXAMINATION: ECOG PERFORMANCE STATUS: 1 - Symptomatic but completely ambulatory  Vitals:   12/03/21 1111  BP: 129/67  Pulse: 69  Resp: 18  Temp: (!) 97.5 F (36.4 C)  SpO2: 100%   Filed Weights   12/03/21 1111  Weight: 158 lb 3.2 oz (71.8 kg)     BREAST: No palpable masses or nodules in either right or left breasts. No palpable axillary supraclavicular or infraclavicular adenopathy no breast tenderness or nipple discharge. (exam performed in the presence of a chaperone)  LABORATORY DATA:  I have reviewed the data as listed    Latest Ref Rng & Units 12/03/2021   10:43 AM 04/22/2020    8:44 AM 10/11/2019    7:51 AM  CMP  Glucose 70 - 99 mg/dL 83   102   138    BUN 6 - 20 mg/dL '14   15   14    ' Creatinine 0.44 - 1.00 mg/dL 0.92   0.88   0.90    Sodium 135 - 145 mmol/L 141   141   142    Potassium 3.5 - 5.1 mmol/L 4.7   4.3   4.3    Chloride 98 - 111 mmol/L 109   108   109    CO2 22 - 32 mmol/L '27   29   24    ' Calcium 8.9 - 10.3 mg/dL 9.0   9.3   8.7     Total Protein 6.5 - 8.1 g/dL 6.8   6.6   6.3    Total Bilirubin 0.3 - 1.2 mg/dL 0.3   0.3   0.4    Alkaline Phos 38 - 126 U/L 43   49   61    AST 15 - 41 U/L '19   18   16    ' ALT 0 - 44 U/L '18   18   14      ' Lab Results  Component Value Date   WBC 4.2 12/03/2021   HGB 13.8 12/03/2021   HCT 41.3 12/03/2021   MCV 94.9 12/03/2021   PLT 237 12/03/2021   NEUTROABS 2.4 12/03/2021    ASSESSMENT & PLAN:  Malignant neoplasm of upper-outer quadrant of left breast in female, estrogen receptor positive ([place]) 10/02/2018: T2N0 stage Ib ILC ER/PR positive HER2 negative Ki-67 less than 1% 10/18/2018: Left mastectomy: T3N2 stage IIa grade 2 invasive lobular cancer, margins negative, 5/9 lymph nodes positive 11/22/2018-04/02/2019: Dose dense Adriamycin and Cytoxan x4 followed by weekly Taxol x11 06/20/2019-07/05/2019: Adjuvant radiation Genetics:  Negative  Current treatment: Adjuvant tamoxifen started 08/16/2019 Tamoxifen toxicities: 1.  Vaginal dryness: She uses Estrace cream. 2. muscle cramps especially at night 3.  Hot flashes that wake her up multiple times through the night  We are waiting on today's labs to determine if she is in menopause.  If she is not following our recommendation we will switch her from tamoxifen to letrozole.  Breast cancer surveillance: 1.  Breast exam 12/03/2021: Benign 2. 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 Return to clinic in 1 year for follow-up    Orders Placed This Encounter  Procedures   MR BREAST BILATERAL [facility] [place] CAD    Standing Status:   Future    Standing Expiration Date:   12/04/2022    Order Specific Question:   If indicated for the ordered procedure, I authorize the administration of contrast media per Radiology protocol    Answer:   Yes    Order Specific Question:   What is the patient's sedation requirement?     Answer:   No Sedation    Order Specific Question:   Does the patient have a pacemaker or implanted devices?    Answer:   No    Order Specific Question:   Preferred imaging location?    Answer:   [facility] (table limit-550lbs)    Order Specific Question:   Release to patient    Answer:   Immediate   The patient has a good understanding of the overall plan. she agrees with it. she will call with any problems that may develop before the next visit here. Total time spent: 30 mins including face to face time and time spent for planning, charting and co-ordination of care   Harriette Ohara, MD 12/03/21    I Gardiner Coins am scribing for Dr. Lindi Adie  I have reviewed the above documentation for accuracy and completeness, and I agree with the above.

## 2021-12-01 ENCOUNTER — Other Ambulatory Visit: Payer: Self-pay | Admitting: *Deleted

## 2021-12-01 DIAGNOSIS — C50412 Malignant neoplasm of upper-outer quadrant of left female breast: Secondary | ICD-10-CM

## 2021-12-03 ENCOUNTER — Other Ambulatory Visit: Payer: Self-pay

## 2021-12-03 ENCOUNTER — Inpatient Hospital Stay: Payer: 59

## 2021-12-03 ENCOUNTER — Inpatient Hospital Stay: Payer: 59 | Attending: Hematology and Oncology | Admitting: Hematology and Oncology

## 2021-12-03 ENCOUNTER — Encounter: Payer: Self-pay | Admitting: *Deleted

## 2021-12-03 ENCOUNTER — Other Ambulatory Visit: Payer: Self-pay | Admitting: *Deleted

## 2021-12-03 DIAGNOSIS — N951 Menopausal and female climacteric states: Secondary | ICD-10-CM | POA: Diagnosis not present

## 2021-12-03 DIAGNOSIS — R252 Cramp and spasm: Secondary | ICD-10-CM | POA: Diagnosis not present

## 2021-12-03 DIAGNOSIS — C50412 Malignant neoplasm of upper-outer quadrant of left female breast: Secondary | ICD-10-CM | POA: Diagnosis not present

## 2021-12-03 DIAGNOSIS — N898 Other specified noninflammatory disorders of vagina: Secondary | ICD-10-CM | POA: Insufficient documentation

## 2021-12-03 DIAGNOSIS — Z9012 Acquired absence of left breast and nipple: Secondary | ICD-10-CM | POA: Diagnosis not present

## 2021-12-03 DIAGNOSIS — Z170 Estrogen receptor positive status [ER+]: Secondary | ICD-10-CM

## 2021-12-03 LAB — CBC WITH DIFFERENTIAL (CANCER CENTER ONLY)
Abs Immature Granulocytes: 0.01 10*3/uL (ref 0.00–0.07)
Basophils Absolute: 0 10*3/uL (ref 0.0–0.1)
Basophils Relative: 1 %
Eosinophils Absolute: 0.1 10*3/uL (ref 0.0–0.5)
Eosinophils Relative: 2 %
HCT: 41.3 % (ref 36.0–46.0)
Hemoglobin: 13.8 g/dL (ref 12.0–15.0)
Immature Granulocytes: 0 %
Lymphocytes Relative: 31 %
Lymphs Abs: 1.3 10*3/uL (ref 0.7–4.0)
MCH: 31.7 pg (ref 26.0–34.0)
MCHC: 33.4 g/dL (ref 30.0–36.0)
MCV: 94.9 fL (ref 80.0–100.0)
Monocytes Absolute: 0.4 10*3/uL (ref 0.1–1.0)
Monocytes Relative: 10 %
Neutro Abs: 2.4 10*3/uL (ref 1.7–7.7)
Neutrophils Relative %: 56 %
Platelet Count: 237 10*3/uL (ref 150–400)
RBC: 4.35 MIL/uL (ref 3.87–5.11)
RDW: 13.2 % (ref 11.5–15.5)
WBC Count: 4.2 10*3/uL (ref 4.0–10.5)
nRBC: 0 % (ref 0.0–0.2)

## 2021-12-03 LAB — CMP (CANCER CENTER ONLY)
ALT: 18 U/L (ref 0–44)
AST: 19 U/L (ref 15–41)
Albumin: 4 g/dL (ref 3.5–5.0)
Alkaline Phosphatase: 43 U/L (ref 38–126)
Anion gap: 5 (ref 5–15)
BUN: 14 mg/dL (ref 6–20)
CO2: 27 mmol/L (ref 22–32)
Calcium: 9 mg/dL (ref 8.9–10.3)
Chloride: 109 mmol/L (ref 98–111)
Creatinine: 0.92 mg/dL (ref 0.44–1.00)
GFR, Estimated: >60 mL/min (ref >60)
Glucose, Bld: 83 mg/dL (ref 70–99)
Potassium: 4.7 mmol/L (ref 3.5–5.1)
Sodium: 141 mmol/L (ref 135–145)
Total Bilirubin: 0.3 mg/dL (ref 0.3–1.2)
Total Protein: 6.8 g/dL (ref 6.5–8.1)

## 2021-12-03 NOTE — Progress Notes (Signed)
Per MD request, RN successfully faxed Signatera orders [phone]).

## 2021-12-03 NOTE — Assessment & Plan Note (Signed)
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  Vaginal dryness  Breast cancer surveillance: 1.  Breast exam 12/03/2021: Benign 2. mammogram 11/03/2020 at [facility]: Benign breast density category C  Return to clinic in 1 year for follow-up

## 2021-12-04 LAB — FOLLICLE STIMULATING HORMONE: FSH: 39.3 m[IU]/mL

## 2021-12-08 LAB — ESTRADIOL, ULTRA SENS: Estradiol, Sensitive: <2.5 pg/mL

## 2021-12-10 NOTE — Progress Notes (Signed)
Faxed surgical path report to [facility] [phone]

## 2021-12-15 ENCOUNTER — Encounter: Payer: Self-pay | Admitting: Hematology and Oncology

## 2021-12-17 ENCOUNTER — Ambulatory Visit (INDEPENDENT_AMBULATORY_CARE_PROVIDER_SITE_OTHER): Payer: 59 | Admitting: Plastic Surgery

## 2021-12-17 DIAGNOSIS — Z9012 Acquired absence of left breast and nipple: Secondary | ICD-10-CM | POA: Diagnosis not present

## 2021-12-17 DIAGNOSIS — Z853 Personal history of malignant neoplasm of breast: Secondary | ICD-10-CM

## 2021-12-17 NOTE — Progress Notes (Signed)
Patient ID: [name], female    DOB: [birth year], 51 y.o.   MRN: [id number]   Chief Complaint  Patient presents with   Breast Problem     The patient is a 51 year old female here for 1 year follow-up after undergoing breast reconstruction.  She had a left-sided mastectomy for breast cancer and had point placement.  She has a Product manager high-profile gel 225 cc implant in place since September 2020.  Results and does not have any complaints.  She has good symmetry in close nipple areolar tattooing but we did talk about it today and she is willing to consider it.  She will likely undergo an MRI by her oncologist next year.   Review of Systems  Constitutional: Negative.   HENT: Negative.    Eyes: Negative.   Respiratory: Negative.    Cardiovascular: Negative.   Gastrointestinal: Negative.   Endocrine: Negative.   Genitourinary: Negative.   Musculoskeletal: Negative.   Skin: Negative.    Past Medical History:  Diagnosis Date   Cancer ([place]) 10/18/2018   left breast cancer   Family history of cancer     Past Surgical History:  Procedure Laterality Date   BREAST RECONSTRUCTION WITH PLACEMENT OF TISSUE EXPANDER AND FLEX HD (ACELLULAR HYDRATED DERMIS) Left 10/18/2018   Procedure: IMMEDIATEVBREAST RECONSTRUCTION WITH PLACEMENT OF TISSUE EXPANDER AND FLEX HD (ACELLULAR HYDRATED DERMIS);  Surgeon: Wallace Going, DO;  Location: [place];  Service: Plastics;  Laterality: Left;   MASTECTOMY     MASTECTOMY W/ SENTINEL NODE BIOPSY Left 10/18/2018   Procedure: LEFT MASTECTOMY WITH SENTINEL LYMPH NODE BIOPSY;  Surgeon: Jovita Kussmaul, MD;  Location: [place];  Service: General;  Laterality: Left;   PORT-A-CATH REMOVAL Right 04/12/2019   Procedure: REMOVAL PORT-A-CATH;  Surgeon: Wallace Going, DO;  Location: [place];  Service: Plastics;  Laterality: Right;   PORTACATH PLACEMENT Right 11/02/2018   Procedure: INSERTION  PORT-A-CATH WITH ULTRASOUND;  Surgeon: Jovita Kussmaul, MD;  Location: [place];  Service: General;  Laterality: Right;   REMOVAL OF TISSUE EXPANDER AND PLACEMENT OF IMPLANT Left 04/12/2019   Procedure: REMOVAL OF TISSUE EXPANDER AND PLACEMENT OF IMPLANT;  Surgeon: Wallace Going, DO;  Location: [place];  Service: Plastics;  Laterality: Left;  90 min, please      Current Outpatient Medications:    estradiol (ESTRACE) 0.1 MG/GM vaginal cream, PLACE 1 APPLICATORFUL VAGINALLY 3 (THREE) TIMES A WEEK., Disp: 42.5 g, Rfl: 0   tamoxifen (NOLVADEX) 20 MG tablet, Take 1 tablet (20 mg total) by mouth daily., Disp: 90 tablet, Rfl: 4   Objective:   There were no vitals filed for this visit.  Physical Exam Vitals reviewed.  Constitutional:      Appearance: Normal appearance.  HENT:     Head: Normocephalic and atraumatic.  Cardiovascular:     Rate and Rhythm: Normal rate.     Pulses: Normal pulses.  Pulmonary:     Effort: Pulmonary effort is normal.  Abdominal:     General: There is no distension.     Palpations: Abdomen is soft.     Tenderness: There is no abdominal tenderness.  Skin:    Capillary Refill: Capillary refill takes less than 2 seconds.     Coloration: Skin is not jaundiced.     Findings: No bruising.  Neurological:     Mental Status: She is alert and oriented to person, place, and time.  Psychiatric:        Mood and Affect: Mood normal.        Behavior: Behavior normal.        Thought Content: Thought content normal.        Judgment: Judgment normal.    Assessment & Plan:  Acquired absence of left breast  If she undergoes an MRI she will need an ultrasound for another 3 years.  I would like to see her back in 1 year.  Pictures were obtained of the patient and placed in the chart with the patient's or guardian's permission.   [place], DO

## 2021-12-18 ENCOUNTER — Ambulatory Visit: Payer: 59 | Admitting: Plastic Surgery

## 2021-12-22 ENCOUNTER — Other Ambulatory Visit: Payer: Self-pay | Admitting: Oncology

## 2022-01-20 ENCOUNTER — Encounter (HOSPITAL_COMMUNITY): Payer: Self-pay

## 2022-01-22 ENCOUNTER — Encounter: Payer: Self-pay | Admitting: Hematology and Oncology

## 2022-03-05 ENCOUNTER — Other Ambulatory Visit: Payer: Self-pay | Admitting: *Deleted

## 2022-03-05 ENCOUNTER — Encounter: Payer: Self-pay | Admitting: Hematology and Oncology

## 2022-03-05 MED ORDER — TAMOXIFEN CITRATE 20 MG PO TABS: 20.0000 mg | ORAL_TABLET | Freq: Every day | ORAL | 4 refills | Status: DC

## 2022-05-10 ENCOUNTER — Ambulatory Visit
Admission: RE | Admit: 2022-05-10 | Discharge: 2022-05-10 | Disposition: A | Discharge status: Home / Self Care | Payer: 59 | Source: Ambulatory Visit | Attending: Hematology and Oncology | Admitting: Hematology and Oncology

## 2022-05-10 DIAGNOSIS — C50412 Malignant neoplasm of upper-outer quadrant of left female breast: Secondary | ICD-10-CM

## 2022-05-10 MED ORDER — GADOPICLENOL 0.5 MMOL/ML IV SOLN
7.5000 mL | Freq: Once | INTRAVENOUS | Status: AC | PRN
  Administered 2022-05-10: 7.5 mL via INTRAVENOUS

## 2022-07-27 ENCOUNTER — Encounter: Payer: Self-pay | Admitting: Hematology and Oncology

## 2022-07-27 ENCOUNTER — Encounter: Payer: Self-pay | Admitting: *Deleted

## 2022-07-27 NOTE — Progress Notes (Signed)
Receive a message from [employer] representative stating patient did not respond to their call to schedule mobile phlebotomy draw.  Representative states patient will need to reach out to [employer] directly at [phone] option #1 [phone] to schedule if patient wishes to proceed.   

## 2022-08-20 ENCOUNTER — Encounter: Payer: Self-pay | Admitting: Hematology and Oncology

## 2022-11-08 ENCOUNTER — Encounter: Payer: Self-pay | Admitting: Licensed Clinical Social Worker

## 2022-11-08 NOTE — Progress Notes (Signed)
UPDATE: VUS in MLH1 called c.[id number] has been reclassified to "Likely Benign." The amended report date is 11/08/2022.

## 2022-12-01 ENCOUNTER — Other Ambulatory Visit: Payer: Self-pay | Admitting: Hematology and Oncology

## 2022-12-07 NOTE — Progress Notes (Signed)
Patient Care Team: Joycelyn Rua, MD as PCP - General (Family Medicine) Griselda Miner, MD as Consulting Physician (General Surgery) Magrinat, Valentino Hue, MD (Inactive) as Consulting Physician (Oncology) Dorothy Puffer, MD as Consulting Physician (Radiation Oncology) Noland Fordyce, MD as Consulting Physician (Obstetrics and Gynecology) Ulice Bold Alena Bills, DO as Attending Physician (Plastic Surgery)  DIAGNOSIS: No diagnosis found.  SUMMARY OF ONCOLOGIC HISTORY: Oncology History  Malignant neoplasm of upper-outer quadrant of left breast in female, estrogen receptor positive (HCC)  10/10/2018 Initial Diagnosis   Malignant neoplasm of upper-outer quadrant of left breast in female, estrogen receptor positive (HCC)   10/18/2018 Surgery   Left mastectomy Carolynne Edouard) [phone]): Invasive Lobular Carcinoma, 7.8 cm, grade 2, negative margins. ER+, PR+, HER2-, with an MIB-1 of less than 1%. 5/9 lymph nodes were positive for carcinoma.   11/22/2018 - 04/02/2019 Chemotherapy   DOXOrubicin (ADRIAMYCIN) chemo injection 104 mg, 60 mg/m2 = 104 mg, Intravenous,  Once, 4 of 4 cycles.. Administration: 104 mg (11/22/2018), 104 mg (12/05/2018), 104 mg (12/19/2018), 104 mg (01/02/2019)  palonosetron (ALOXI) injection 0.25 mg, 0.25 mg, Intravenous,  Once, 4 of 4 cycles. Administration: 0.25 mg (11/22/2018), 0.25 mg (12/05/2018), 0.25 mg (12/19/2018), 0.25 mg (01/02/2019)  pegfilgrastim (NEULASTA ONPRO KIT) injection 6 mg, 6 mg, Subcutaneous, Once, 4 of 4 cycles. Administration: 6 mg (11/22/2018), 6 mg (12/05/2018), 6 mg (12/19/2018), 6 mg (01/02/2019)  cyclophosphamide (CYTOXAN) 1,040 mg in sodium chloride 0.9 % 250 mL chemo infusion, 600 mg/m2 = 1,040 mg, Intravenous,  Once, 4 of 4 cycles. Administration: 1,040 mg (11/22/2018), 1,040 mg (12/05/2018), 1,040 mg (12/19/2018), 1,040 mg (01/02/2019)  PACLitaxel (TAXOL) 138 mg in sodium chloride 0.9 % 250 mL chemo infusion (</= 80mg /m2), 80 mg/m2 = 138 mg, Intravenous,  Once, 11 of 11 cycles.  Administration: 138 mg (01/15/2019), 138 mg (01/22/2019), 138 mg (01/29/2019), 138 mg (02/05/2019), 138 mg (02/12/2019), 138 mg (02/19/2019), 138 mg (02/26/2019), 138 mg (03/05/2019), 138 mg (03/19/2019), 138 mg (03/27/2019), 138 mg (04/02/2019)  fosaprepitant (EMEND) 150 mg  dexamethasone (DECADRON) 12 mg in sodium chloride 0.9 % 145 mL IVPB, , Intravenous,  Once, 4 of 4 cycles. Administration:  (11/22/2018),  (12/05/2018),  (12/19/2018),  (01/02/2019)     05/21/2019 - 06/28/2019 Radiation Therapy   The patient initially received a dose of 50.4 Gy in 28 fractions to the chest wall and supraclavicular region. This was delivered using a 3-D conformal, 4 field technique. The patient then received a boost to the mastectomy scar. This delivered an additional 10 Gy in 5 fractions using an en face electron field. The total dose was 60.4 Gy.   07/2019 - 07/2029 Anti-estrogen oral therapy   Tamoxifen   08/20/2019 Genetic Testing   Negative genetic testing. 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 The report date is 08/16/2019.  The Common Hereditary Cancers Panel offered by [facility] includes sequencing and/or deletion duplication testing of the following 48 genes: APC, ATM, AXIN2, BARD1, BMPR1A, BRCA1, BRCA2, BRIP1, CDH1, CDKN2A (p14ARF), CDKN2A (p16INK4a), CKD4, CHEK2, CTNNA1, DICER1, EPCAM (Deletion/duplication testing only), GREM1 (promoter region deletion/duplication testing only), KIT, MEN1, MLH1, MSH2, MSH3, MSH6, MUTYH, NBN, NF1, NHTL1, PALB2, PDGFRA, PMS2, POLD1, POLE, PTEN, RAD50, RAD51C, RAD51D, RNF43, SDHB, SDHC, SDHD, SMAD4, SMARCA4. STK11, TP53, TSC1, TSC2, and VHL.  The following genes were evaluated for sequence changes only: SDHA and HOXB13 c.251G>A variant only.     CHIEF COMPLIANT:  Estrogen receptor positive breast cancer  on Tamoxifen   INTERVAL HISTORY: Tenesha Peter is  a 52 y.o. with the above mention Estrogen receptor positive breast cancer  on  Tamoxifen. She presents to the clinic today for a follow-up.   ALLERGIES:  is allergic to septra [sulfamethoxazole-trimethoprim].  MEDICATIONS:  Current Outpatient Medications  Medication Sig Dispense Refill   estradiol (ESTRACE) 0.1 MG/GM vaginal cream PLACE 1 APPLICATORFUL VAGINALLY 3 (THREE) TIMES A WEEK. 42.5 g 0   tamoxifen (NOLVADEX) 20 MG tablet Take 1 tablet (20 mg total) by mouth daily. 90 tablet 4   No current facility-administered medications for this visit.    PHYSICAL EXAMINATION: ECOG PERFORMANCE STATUS: {CHL ONC ECOG [phone]}  There were no vitals filed for this visit. There were no vitals filed for this visit.  BREAST:*** No palpable masses or nodules in either right or left breasts. No palpable axillary supraclavicular or infraclavicular adenopathy no breast tenderness or nipple discharge. (exam performed in the presence of a chaperone)  LABORATORY DATA:  I have reviewed the data as listed    Latest Ref Rng & Units 12/03/2021   10:43 AM 04/22/2020    8:44 AM 10/11/2019    7:51 AM  CMP  Glucose 70 - 99 mg/dL 83  161  096   BUN 6 - 20 mg/dL 14  15  14    Creatinine 0.44 - 1.00 mg/dL 0.45  4.09  8.11   Sodium 135 - 145 mmol/L 141  141  142   Potassium 3.5 - 5.1 mmol/L 4.7  4.3  4.3   Chloride 98 - 111 mmol/L 109  108  109   CO2 22 - 32 mmol/L 27  29  24    Calcium 8.9 - 10.3 mg/dL 9.0  9.3  8.7   Total Protein 6.5 - 8.1 g/dL 6.8  6.6  6.3   Total Bilirubin 0.3 - 1.2 mg/dL 0.3  0.3  0.4   Alkaline Phos 38 - 126 U/L 43  49  61   AST 15 - 41 U/L 19  18  16    ALT 0 - 44 U/L 18  18  14      Lab Results  Component Value Date   WBC 4.2 12/03/2021   HGB 13.8 12/03/2021   HCT 41.3 12/03/2021   MCV 94.9 12/03/2021   PLT 237 12/03/2021   NEUTROABS 2.4 12/03/2021    ASSESSMENT & PLAN:  No problem-specific Assessment & Plan notes found for this encounter.    No orders of the defined types were placed in this encounter.  The patient has a good  understanding of the overall plan. she agrees with it. she will call with any problems that may develop before the next visit here. Total time spent: 30 mins including face to face time and time spent for planning, charting and co-ordination of care   Sherlyn Lick, CMA 12/07/22    I Janan Ridge am acting as a Neurosurgeon for [employer]  ***

## 2022-12-09 ENCOUNTER — Other Ambulatory Visit: Payer: Self-pay

## 2022-12-09 ENCOUNTER — Inpatient Hospital Stay: Payer: 59 | Attending: Hematology and Oncology | Admitting: Hematology and Oncology

## 2022-12-09 VITALS — BP 120/74 | HR 67 | Temp 97.9°F | Wt 159.3 lb

## 2022-12-09 DIAGNOSIS — Z170 Estrogen receptor positive status [ER+]: Secondary | ICD-10-CM | POA: Diagnosis not present

## 2022-12-09 DIAGNOSIS — Z923 Personal history of irradiation: Secondary | ICD-10-CM | POA: Insufficient documentation

## 2022-12-09 DIAGNOSIS — C50412 Malignant neoplasm of upper-outer quadrant of left female breast: Secondary | ICD-10-CM | POA: Diagnosis present

## 2022-12-09 DIAGNOSIS — Z9012 Acquired absence of left breast and nipple: Secondary | ICD-10-CM | POA: Diagnosis not present

## 2022-12-09 NOTE — Assessment & Plan Note (Signed)
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  Vaginal dryness: She uses Estrace cream. 2. muscle cramps especially at night 3.  Hot flashes that wake her up multiple times through the night   We are waiting on today's labs to determine if she is in menopause.  If she is not following our recommendation we will switch her from tamoxifen to letrozole.   Breast cancer surveillance: 1.  Breast exam 12/09/2022: Benign 2. mammogram 11/03/2020 at [facility]: Benign breast density category C 3.  Breast MRI 05/11/2022: Benign breast density category C 4.  [employer] for MRD testing: Negative   She teaches physics at [employer]  Return to clinic in 1 year for follow-up

## 2022-12-10 ENCOUNTER — Telehealth: Payer: Self-pay | Admitting: Hematology and Oncology

## 2022-12-10 NOTE — Telephone Encounter (Signed)
Scheduled appointments per 5/23 los. Patient is aware of the made appointments.

## 2022-12-28 ENCOUNTER — Ambulatory Visit (INDEPENDENT_AMBULATORY_CARE_PROVIDER_SITE_OTHER): Payer: 59 | Admitting: Plastic Surgery

## 2022-12-28 DIAGNOSIS — Z9012 Acquired absence of left breast and nipple: Secondary | ICD-10-CM | POA: Diagnosis not present

## 2022-12-28 DIAGNOSIS — Z170 Estrogen receptor positive status [ER+]: Secondary | ICD-10-CM

## 2022-12-28 DIAGNOSIS — C50412 Malignant neoplasm of upper-outer quadrant of left female breast: Secondary | ICD-10-CM | POA: Diagnosis not present

## 2022-12-28 NOTE — Progress Notes (Signed)
Patient ID: [name], female    DOB: August 01, 1970, 52 y.o.   MRN: [id number]   Chief Complaint  Patient presents with   Follow-up    The patient is a 52 year old female here for a 1 year follow-up after undergoing left breast reconstruction.  She had a left mastectomy with an implant placement for reconstruction.  The reconstruction started at the time of the mastectomy October 18, 2018.  She then had exchange to an implant September 2020.  She currently has a Dance movement psychotherapist smooth round high-profile gel 225 cc implant in the left breast.  She she is presented in nipple areola tattooing at this time.  Her breast is firm as it has been but no significant change.  No areas of concern.      Review of Systems  Constitutional: Negative.   HENT: Negative.    Eyes: Negative.   Respiratory: Negative.    Cardiovascular: Negative.   Gastrointestinal: Negative.   Endocrine: Negative.   Genitourinary: Negative.   Musculoskeletal: Negative.     Past Medical History:  Diagnosis Date   Cancer ([facility]) 10/18/2018   left breast cancer   Family history of cancer     Past Surgical History:  Procedure Laterality Date   BREAST RECONSTRUCTION WITH PLACEMENT OF TISSUE EXPANDER AND FLEX HD (ACELLULAR HYDRATED DERMIS) Left 10/18/2018   Procedure: IMMEDIATEVBREAST RECONSTRUCTION WITH PLACEMENT OF TISSUE EXPANDER AND FLEX HD (ACELLULAR HYDRATED DERMIS);  Surgeon: Peggye Form, DO;  Location: [facility];  Service: Plastics;  Laterality: Left;   MASTECTOMY     MASTECTOMY W/ SENTINEL NODE BIOPSY Left 10/18/2018   Procedure: LEFT MASTECTOMY WITH SENTINEL LYMPH NODE BIOPSY;  Surgeon: Griselda Miner, MD;  Location: [facility];  Service: General;  Laterality: Left;   PORT-A-CATH REMOVAL Right 04/12/2019   Procedure: REMOVAL PORT-A-CATH;  Surgeon: Peggye Form, DO;  Location: [facility];  Service: Plastics;  Laterality: Right;   PORTACATH PLACEMENT Right  11/02/2018   Procedure: INSERTION PORT-A-CATH WITH ULTRASOUND;  Surgeon: Griselda Miner, MD;  Location: [facility];  Service: General;  Laterality: Right;   REMOVAL OF TISSUE EXPANDER AND PLACEMENT OF IMPLANT Left 04/12/2019   Procedure: REMOVAL OF TISSUE EXPANDER AND PLACEMENT OF IMPLANT;  Surgeon: Peggye Form, DO;  [facility];  Service: Plastics;  Laterality: Left;  90 min, please      Current Outpatient Medications:    estradiol (ESTRACE) 0.1 MG/GM vaginal cream, PLACE 1 APPLICATORFUL VAGINALLY 3 (THREE) TIMES A WEEK., Disp: 42.5 g, Rfl: 0   tamoxifen (NOLVADEX) 20 MG tablet, Take 1 tablet (20 mg total) by mouth daily., Disp: 90 tablet, Rfl: 4   Objective:   There were no vitals filed for this visit.  Physical Exam Vitals and nursing note reviewed.  Constitutional:      Appearance: Normal appearance.  HENT:     Head: Normocephalic and atraumatic.  Cardiovascular:     Rate and Rhythm: Normal rate.     Pulses: Normal pulses.  Pulmonary:     Effort: Pulmonary effort is normal.  Abdominal:     Palpations: Abdomen is soft.  Musculoskeletal:        General: No swelling or deformity.  Skin:    General: Skin is warm.     Capillary Refill: Capillary refill takes less than 2 seconds.  Neurological:     Mental Status: She is alert and oriented to person, place, and time.  Psychiatric:        Mood and Affect: Mood normal.        Behavior: Behavior normal.        Thought Content: Thought content normal.        Judgment: Judgment normal.     Assessment & Plan:  Malignant neoplasm of upper-outer quadrant of left breast in female, estrogen receptor positive (HCC)  Acquired absence of left breast  We will plan for an implant evaluation next year with ultrasound.  If anything changes the patient can certainly do it sooner.  And I will plan to see her in 1 year.  Alena Bills Kendria Halberg, DO

## 2023-02-17 ENCOUNTER — Other Ambulatory Visit: Payer: 59

## 2023-05-31 ENCOUNTER — Other Ambulatory Visit: Payer: Self-pay | Admitting: Hematology and Oncology

## 2023-12-09 ENCOUNTER — Telehealth: Payer: Self-pay | Admitting: Plastic Surgery

## 2023-12-09 ENCOUNTER — Telehealth: Payer: Self-pay

## 2023-12-09 NOTE — Telephone Encounter (Signed)
 Dr D out of office, lvmail to call back to r/s

## 2023-12-09 NOTE — Telephone Encounter (Signed)
 Called LVM to reschedule due to provider out of office

## 2023-12-13 ENCOUNTER — Other Ambulatory Visit: Payer: Self-pay | Admitting: *Deleted

## 2023-12-13 ENCOUNTER — Inpatient Hospital Stay: Payer: 59 | Attending: Hematology and Oncology

## 2023-12-13 ENCOUNTER — Inpatient Hospital Stay (HOSPITAL_BASED_OUTPATIENT_CLINIC_OR_DEPARTMENT_OTHER): Payer: 59 | Admitting: Hematology and Oncology

## 2023-12-13 VITALS — BP 128/55 | HR 65 | Temp 98.0°F | Resp 17 | Ht 66.0 in | Wt 161.4 lb

## 2023-12-13 DIAGNOSIS — R21 Rash and other nonspecific skin eruption: Secondary | ICD-10-CM | POA: Diagnosis not present

## 2023-12-13 DIAGNOSIS — Z170 Estrogen receptor positive status [ER+]: Secondary | ICD-10-CM

## 2023-12-13 DIAGNOSIS — C50412 Malignant neoplasm of upper-outer quadrant of left female breast: Secondary | ICD-10-CM | POA: Diagnosis present

## 2023-12-13 DIAGNOSIS — C773 Secondary and unspecified malignant neoplasm of axilla and upper limb lymph nodes: Secondary | ICD-10-CM | POA: Diagnosis not present

## 2023-12-13 DIAGNOSIS — Z9012 Acquired absence of left breast and nipple: Secondary | ICD-10-CM | POA: Insufficient documentation

## 2023-12-13 DIAGNOSIS — L309 Dermatitis, unspecified: Secondary | ICD-10-CM | POA: Insufficient documentation

## 2023-12-13 DIAGNOSIS — Z1732 Human epidermal growth factor receptor 2 negative status: Secondary | ICD-10-CM | POA: Insufficient documentation

## 2023-12-13 DIAGNOSIS — R252 Cramp and spasm: Secondary | ICD-10-CM | POA: Diagnosis not present

## 2023-12-13 DIAGNOSIS — Z1721 Progesterone receptor positive status: Secondary | ICD-10-CM | POA: Diagnosis not present

## 2023-12-13 DIAGNOSIS — N951 Menopausal and female climacteric states: Secondary | ICD-10-CM | POA: Diagnosis not present

## 2023-12-13 LAB — CBC WITH DIFFERENTIAL (CANCER CENTER ONLY)
Abs Immature Granulocytes: 0.01 10*3/uL (ref 0.00–0.07)
Basophils Absolute: 0 10*3/uL (ref 0.0–0.1)
Basophils Relative: 1 %
Eosinophils Absolute: 0.1 10*3/uL (ref 0.0–0.5)
Eosinophils Relative: 2 %
HCT: 41.9 % (ref 36.0–46.0)
Hemoglobin: 13.8 g/dL (ref 12.0–15.0)
Immature Granulocytes: 0 %
Lymphocytes Relative: 39 %
Lymphs Abs: 1.6 10*3/uL (ref 0.7–4.0)
MCH: 30.8 pg (ref 26.0–34.0)
MCHC: 32.9 g/dL (ref 30.0–36.0)
MCV: 93.5 fL (ref 80.0–100.0)
Monocytes Absolute: 0.3 10*3/uL (ref 0.1–1.0)
Monocytes Relative: 8 %
Neutro Abs: 2.1 10*3/uL (ref 1.7–7.7)
Neutrophils Relative %: 50 %
Platelet Count: 260 10*3/uL (ref 150–400)
RBC: 4.48 MIL/uL (ref 3.87–5.11)
RDW: 12.8 % (ref 11.5–15.5)
WBC Count: 4.2 10*3/uL (ref 4.0–10.5)
nRBC: 0 % (ref 0.0–0.2)

## 2023-12-13 LAB — CMP (CANCER CENTER ONLY)
ALT: 15 U/L (ref 0–44)
AST: 17 U/L (ref 15–41)
Albumin: 4.2 g/dL (ref 3.5–5.0)
Alkaline Phosphatase: 40 U/L (ref 38–126)
Anion gap: 7 (ref 5–15)
BUN: 18 mg/dL (ref 6–20)
CO2: 26 mmol/L (ref 22–32)
Calcium: 8.8 mg/dL — ABNORMAL LOW (ref 8.9–10.3)
Chloride: 107 mmol/L (ref 98–111)
Creatinine: 0.98 mg/dL (ref 0.44–1.00)
GFR, Estimated: >60 mL/min (ref >60)
Glucose, Bld: 97 mg/dL (ref 70–99)
Potassium: 4.6 mmol/L (ref 3.5–5.1)
Sodium: 140 mmol/L (ref 135–145)
Total Bilirubin: 0.4 mg/dL (ref 0.0–1.2)
Total Protein: 6.8 g/dL (ref 6.5–8.1)

## 2023-12-13 NOTE — Progress Notes (Signed)
 Patient Care Team: Wyn Heater, MD as PCP - General (Family Medicine) Caralyn Chandler, MD as Consulting Physician (General Surgery) Magrinat, Rozella Cornfield, MD (Inactive) as Consulting Physician (Oncology) Johna Myers, MD as Consulting Physician (Radiation Oncology) Audelia Leaks, MD as Consulting Physician (Obstetrics and Gynecology) Orin Birk Lindaann Requena, DO as Attending Physician (Plastic Surgery)  DIAGNOSIS:  Encounter Diagnosis  Name Primary?   Malignant neoplasm of upper-outer quadrant of left breast in female, estrogen receptor positive (HCC) Yes    SUMMARY OF ONCOLOGIC HISTORY: Oncology History  Malignant neoplasm of upper-outer quadrant of left breast in female, estrogen receptor positive (HCC)  10/10/2018 Initial Diagnosis   Malignant neoplasm of upper-outer quadrant of left breast in female, estrogen receptor positive (HCC)   10/18/2018 Surgery   Left mastectomy [name]) [phone]): Invasive Lobular Carcinoma, 7.8 cm, grade 2, negative margins. ER+, PR+, HER2-, with an MIB-1 of less than 1%. 5/9 lymph nodes were positive for carcinoma.   11/22/2018 - 04/02/2019 Chemotherapy   DOXOrubicin  (ADRIAMYCIN ) chemo injection 104 mg, 60 mg/m2 = 104 mg, Intravenous,  Once, 4 of 4 cycles.. Administration: 104 mg (11/22/2018), 104 mg (12/05/2018), 104 mg (12/19/2018), 104 mg (01/02/2019)  palonosetron  (ALOXI ) injection 0.25 mg, 0.25 mg, Intravenous,  Once, 4 of 4 cycles. Administration: 0.25 mg (11/22/2018), 0.25 mg (12/05/2018), 0.25 mg (12/19/2018), 0.25 mg (01/02/2019)  pegfilgrastim  (NEULASTA  ONPRO KIT) injection 6 mg, 6 mg, Subcutaneous, Once, 4 of 4 cycles. Administration: 6 mg (11/22/2018), 6 mg (12/05/2018), 6 mg (12/19/2018), 6 mg (01/02/2019)  cyclophosphamide  (CYTOXAN ) 1,040 mg in sodium chloride  0.9 % 250 mL chemo infusion, 600 mg/m2 = 1,040 mg, Intravenous,  Once, 4 of 4 cycles. Administration: 1,040 mg (11/22/2018), 1,040 mg (12/05/2018), 1,040 mg (12/19/2018), 1,040 mg (01/02/2019)  PACLitaxel   (TAXOL ) 138 mg in sodium chloride  0.9 % 250 mL chemo infusion (</= 80mg /m2), 80 mg/m2 = 138 mg, Intravenous,  Once, 11 of 11 cycles. Administration: 138 mg (01/15/2019), 138 mg (01/22/2019), 138 mg (01/29/2019), 138 mg (02/05/2019), 138 mg (02/12/2019), 138 mg (02/19/2019), 138 mg (02/26/2019), 138 mg (03/05/2019), 138 mg (03/19/2019), 138 mg (03/27/2019), 138 mg (04/02/2019)  fosaprepitant  (EMEND ) 150 mg  dexamethasone  (DECADRON ) 12 mg in sodium chloride  0.9 % 145 mL IVPB, , Intravenous,  Once, 4 of 4 cycles. Administration:  (11/22/2018),  (12/05/2018),  (12/19/2018),  (01/02/2019)     05/21/2019 - 06/28/2019 Radiation Therapy   The patient initially received a dose of 50.4 Gy in 28 fractions to the chest wall and supraclavicular region. This was delivered using a 3-D conformal, 4 field technique. The patient then received a boost to the mastectomy scar. This delivered an additional 10 Gy in 5 fractions using an en face electron field. The total dose was 60.4 Gy.   07/2019 - 07/2029 Anti-estrogen oral therapy   Tamoxifen    08/20/2019 Genetic Testing   Negative genetic testing. 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 The report date is 08/16/2019.  The Common Hereditary Cancers Panel offered by [facility] includes sequencing and/or deletion duplication testing of the following 48 genes: APC, ATM, AXIN2, BARD1, BMPR1A, BRCA1, BRCA2, BRIP1, CDH1, CDKN2A (p14ARF), CDKN2A (p16INK4a), CKD4, CHEK2, CTNNA1, DICER1, EPCAM (Deletion/duplication testing only), GREM1 (promoter region deletion/duplication testing only), KIT, MEN1, MLH1, MSH2, MSH3, MSH6, MUTYH, NBN, NF1, NHTL1, PALB2, PDGFRA, PMS2, POLD1, POLE, PTEN, RAD50, RAD51C, RAD51D, RNF43, SDHB, SDHC, SDHD, SMAD4, SMARCA4. STK11, TP53, TSC1, TSC2, and VHL.  The following genes were evaluated for sequence changes only: SDHA and HOXB13 c.251G>A variant only.  CHIEF COMPLIANT: Surveillance of breast cancer  HISTORY OF PRESENT  ILLNESS:   History of Present Illness [name] is a 53 year old female with breast cancer who presents for a follow-up on her current treatment with tamoxifen  and evaluation of menopausal status.  She has been on tamoxifen  since early 2021 for estrogen receptor-positive breast cancer. She experiences hot flashes and muscle cramps, particularly at night, which are manageable. Walking alleviates the cramps and positively impacts her weight. Joint stiffness is present, attributed to menopausal symptoms.  She is vigilant about her follow-up care and has a mammogram scheduled for Thursday. She has participated in a clinical trial involving a blood test for early detection of cancer recurrence, known as 'Signatera', which was not billed to her.     ALLERGIES:  is allergic to septra [sulfamethoxazole-trimethoprim].  MEDICATIONS:  Current Outpatient Medications  Medication Sig Dispense Refill   tamoxifen  (NOLVADEX ) 20 MG tablet TAKE 1 TABLET BY MOUTH EVERY DAY 90 tablet 4   No current facility-administered medications for this visit.    PHYSICAL EXAMINATION: ECOG PERFORMANCE STATUS: 1 - Symptomatic but completely ambulatory  Vitals:   12/13/23 0830  BP: (!) 128/55  Pulse: 65  Resp: 17  Temp: 98 F (36.7 C)  SpO2: 100%   Filed Weights   12/13/23 0830  Weight: 161 lb 6.4 oz (73.2 kg)      LABORATORY DATA:  I have reviewed the data as listed    Latest Ref Rng & Units 12/03/2021   10:43 AM 04/22/2020    8:44 AM 10/11/2019    7:51 AM  CMP  Glucose 70 - 99 mg/dL 83  409  811   BUN 6 - 20 mg/dL 14  15  14    Creatinine 0.44 - 1.00 mg/dL 9.14  7.82  9.56   Sodium 135 - 145 mmol/L 141  141  142   Potassium 3.5 - 5.1 mmol/L 4.7  4.3  4.3   Chloride 98 - 111 mmol/L 109  108  109   CO2 22 - 32 mmol/L 27  29  24    Calcium 8.9 - 10.3 mg/dL 9.0  9.3  8.7   Total Protein 6.5 - 8.1 g/dL 6.8  6.6  6.3   Total Bilirubin 0.3 - 1.2 mg/dL 0.3  0.3  0.4   Alkaline Phos 38 - 126  U/L 43  49  61   AST 15 - 41 U/L 19  18  16    ALT 0 - 44 U/L 18  18  14      Lab Results  Component Value Date   WBC 4.2 12/13/2023   HGB 13.8 12/13/2023   HCT 41.9 12/13/2023   MCV 93.5 12/13/2023   PLT 260 12/13/2023   NEUTROABS 2.1 12/13/2023    ASSESSMENT & PLAN:  Malignant neoplasm of upper-outer quadrant of left breast in female, estrogen receptor positive (HCC) 10/02/2018: T2N0 stage Ib ILC ER/PR positive HER2 negative Ki-67 less than 1% 10/18/2018: Left mastectomy: T3N2 stage IIa grade 2 invasive lobular cancer, margins negative, 5/9 lymph nodes positive 11/22/2018-04/02/2019: Dose dense Adriamycin  and Cytoxan  x4 followed by weekly Taxol  x11 06/20/2019-07/05/2019: Adjuvant radiation Genetics: Negative   Current treatment: Adjuvant tamoxifen  started 08/16/2019 Tamoxifen  toxicities: 1.  Vaginal dryness: She stopped using the estrogen cream because she did not think she needs it anymore. 2. muscle cramps especially at night   Last year [facility] was 36.5 and estradiol  was less than 2.5.  But she had a cycle in August 2023.  Therefore we plan to perform labs in 1 year and then discussed switching her from tamoxifen  to anastrozole therapy.   Breast cancer surveillance: 1.  Breast exam 12/13/2023: Benign 2. mammogram 12/09/22 at [facility]: Benign breast density category C 3.  Breast MRI 05/11/2022: Benign breast density category C 4.  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 Considering switch to anastrozole if menopausal status confirmed. Anastrozole superior in postmenopausal women due to reduced estrogen production. Discussed side effects and efficacy advantage of anastrozole. Estrogen cream  discussed for vaginal dryness. Tamoxifen  remains an option if anastrozole not tolerated. - Recheck estrogen, estradiol , and FSH levels to confirm menopausal status. - Consider anastrozole 1 mg daily if menopausal. - Check bone density every two years. - Encourage calcium and vitamin D supplementation. - Continue yearly mammogram, next scheduled for Thursday. - Set up Signatera blood test every six months for early recurrence monitoring. - Plan MRI every three years, next due December 2026. - Consider contrast-enhanced mammogram in future.  Menopausal symptoms Hot flashes and muscle cramps likely related to tamoxifen . Symptoms manageable with physical activity. Joint stiffness possibly related to menopausal status. Discussed exercise, turmeric, and glucosamine benefits. - Encourage regular physical activity to alleviate muscle cramps and joint stiffness. - Consider turmeric or glucosamine for joint symptoms.  Eczema Intermittent dry skin patches, possibly eczema, not responding to cortisone. Present for two months in sun-exposed areas. Dermatology referral recommended. - Refer to [facility] for evaluation.      No orders of the defined types were placed in this encounter.  The patient has a good understanding of the overall plan. she agrees with it. she will call with any problems that may develop before the next visit here. Total time spent: 30 mins including face to face time and time spent for planning, charting and co-ordination of care   Viinay K Raelynne Ludwick, MD 12/13/23

## 2023-12-13 NOTE — Assessment & Plan Note (Signed)
 10/02/2018: T2N0 stage Ib ILC ER/PR positive HER2 negative Ki-67 less than 1% 10/18/2018: Left mastectomy: T3N2 stage IIa grade 2 invasive lobular cancer, margins negative, 5/9 lymph nodes positive 11/22/2018-04/02/2019: Dose dense Adriamycin  and Cytoxan  x4 followed by weekly Taxol  x11 06/20/2019-07/05/2019: Adjuvant radiation Genetics: Negative   Current treatment: Adjuvant tamoxifen  started 08/16/2019 Tamoxifen  toxicities: 1.  Vaginal dryness: She stopped using the estrogen cream because she did not think she needs it anymore. 2. muscle cramps especially at night   Last year [facility] was 36.5 and estradiol  was less than 2.5.  But she had a cycle in August 2023. Therefore we plan to perform labs in 1 year and then discussed switching her from tamoxifen  to anastrozole therapy.   Breast cancer surveillance: 1.  Breast exam 12/13/2023: Benign 2. mammogram 12/09/22 at [facility]: Benign breast density category C 3.  Breast MRI 05/11/2022: Benign breast density category C 4.  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

## 2023-12-14 LAB — FOLLICLE STIMULATING HORMONE: FSH: 37.8 m[IU]/mL

## 2023-12-19 ENCOUNTER — Encounter: Payer: Self-pay | Admitting: Diagnostic Radiology

## 2023-12-19 NOTE — Assessment & Plan Note (Signed)
 10/02/2018: T2N0 stage Ib ILC ER/PR positive HER2 negative Ki-67 less than 1% 10/18/2018: Left mastectomy: T3N2 stage IIa grade 2 invasive lobular cancer, margins negative, 5/9 lymph nodes positive 11/22/2018-04/02/2019: Dose dense Adriamycin  and Cytoxan  x4 followed by weekly Taxol  x11 06/20/2019-07/05/2019: Adjuvant radiation Genetics: Negative   Current treatment: Adjuvant tamoxifen  started 08/16/2019 Tamoxifen  toxicities: 1.  Vaginal dryness: She stopped using the estrogen cream because she did not think she needs it anymore. 2. muscle cramps especially at night   Therefore we plan to perform labs in 1 year and then discussed switching her from tamoxifen  to anastrozole therapy.   Breast cancer surveillance: 1.  Breast exam 12/13/2023: Benign 2. mammogram 12/09/22 at [facility]: Benign breast density category C 3.  Breast MRI 05/11/2022: Benign breast density category C 4.  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

## 2023-12-20 ENCOUNTER — Inpatient Hospital Stay: Attending: Hematology and Oncology | Admitting: Hematology and Oncology

## 2023-12-20 DIAGNOSIS — C50412 Malignant neoplasm of upper-outer quadrant of left female breast: Secondary | ICD-10-CM

## 2023-12-20 DIAGNOSIS — Z780 Asymptomatic menopausal state: Secondary | ICD-10-CM

## 2023-12-20 DIAGNOSIS — Z1721 Progesterone receptor positive status: Secondary | ICD-10-CM | POA: Diagnosis not present

## 2023-12-20 DIAGNOSIS — Z170 Estrogen receptor positive status [ER+]: Secondary | ICD-10-CM | POA: Diagnosis not present

## 2023-12-20 DIAGNOSIS — Z1732 Human epidermal growth factor receptor 2 negative status: Secondary | ICD-10-CM

## 2023-12-20 LAB — ESTRADIOL, ULTRA SENS: Estradiol, Sensitive: <5 pg/mL

## 2023-12-20 MED ORDER — LETROZOLE 2.5 MG PO TABS: 2.5000 mg | ORAL_TABLET | Freq: Every day | ORAL | 3 refills | Status: AC

## 2023-12-20 NOTE — Progress Notes (Signed)
 HEMATOLOGY-ONCOLOGY TELEPHONE VISIT PROGRESS NOTE  I connected with our patient on 12/20/23 at  7:45 AM EDT by telephone and verified that I am speaking with the correct person using two identifiers.  I discussed the limitations, risks, security and privacy concerns of performing an evaluation and management service by telephone and the availability of in person appointments.  I also discussed with the patient that there may be a patient responsible charge related to this service. The patient expressed understanding and agreed to proceed.   History of Present Illness: Telephone follow-up to discuss the results of FSH and menopause status  History of Present Illness [name] is a 53 year old female who presents for a follow-up on her hormone levels and medication management.  She is menopausal, with her last menstrual cycle occurring approximately a year and a half ago. FSH levels remain in the menopausal range, with the most recent result being 30.8. She is currently on tamoxifen  and is considering switching to anastrozole or letrozole. Recent lab results indicate a low calcium level, raising concerns about potential bone loss with aromatase inhibitors.    Oncology History  Malignant neoplasm of upper-outer quadrant of left breast in female, estrogen receptor positive (HCC)  10/10/2018 Initial Diagnosis   Malignant neoplasm of upper-outer quadrant of left breast in female, estrogen receptor positive (HCC)   10/18/2018 Surgery   Left mastectomy Alethea Andes) [phone]): Invasive Lobular Carcinoma, 7.8 cm, grade 2, negative margins. ER+, PR+, HER2-, with an MIB-1 of less than 1%. 5/9 lymph nodes were positive for carcinoma.   11/22/2018 - 04/02/2019 Chemotherapy   DOXOrubicin  (ADRIAMYCIN ) chemo injection 104 mg, 60 mg/m2 = 104 mg, Intravenous,  Once, 4 of 4 cycles.. Administration: 104 mg (11/22/2018), 104 mg (12/05/2018), 104 mg (12/19/2018), 104 mg (01/02/2019)  palonosetron  (ALOXI ) injection  0.25 mg, 0.25 mg, Intravenous,  Once, 4 of 4 cycles. Administration: 0.25 mg (11/22/2018), 0.25 mg (12/05/2018), 0.25 mg (12/19/2018), 0.25 mg (01/02/2019)  pegfilgrastim  (NEULASTA  ONPRO KIT) injection 6 mg, 6 mg, Subcutaneous, Once, 4 of 4 cycles. Administration: 6 mg (11/22/2018), 6 mg (12/05/2018), 6 mg (12/19/2018), 6 mg (01/02/2019)  cyclophosphamide  (CYTOXAN ) 1,040 mg in sodium chloride  0.9 % 250 mL chemo infusion, 600 mg/m2 = 1,040 mg, Intravenous,  Once, 4 of 4 cycles. Administration: 1,040 mg (11/22/2018), 1,040 mg (12/05/2018), 1,040 mg (12/19/2018), 1,040 mg (01/02/2019)  PACLitaxel  (TAXOL ) 138 mg in sodium chloride  0.9 % 250 mL chemo infusion (</= 80mg /m2), 80 mg/m2 = 138 mg, Intravenous,  Once, 11 of 11 cycles. Administration: 138 mg (01/15/2019), 138 mg (01/22/2019), 138 mg (01/29/2019), 138 mg (02/05/2019), 138 mg (02/12/2019), 138 mg (02/19/2019), 138 mg (02/26/2019), 138 mg (03/05/2019), 138 mg (03/19/2019), 138 mg (03/27/2019), 138 mg (04/02/2019)  fosaprepitant  (EMEND ) 150 mg  dexamethasone  (DECADRON ) 12 mg in sodium chloride  0.9 % 145 mL IVPB, , Intravenous,  Once, 4 of 4 cycles. Administration:  (11/22/2018),  (12/05/2018),  (12/19/2018),  (01/02/2019)     05/21/2019 - 06/28/2019 Radiation Therapy   The patient initially received a dose of 50.4 Gy in 28 fractions to the chest wall and supraclavicular region. This was delivered using a 3-D conformal, 4 field technique. The patient then received a boost to the mastectomy scar. This delivered an additional 10 Gy in 5 fractions using an en face electron field. The total dose was 60.4 Gy.   07/2019 - 07/2029 Anti-estrogen oral therapy   Tamoxifen    08/20/2019 Genetic Testing   Negative genetic testing. VUS in MLH1 called c.1874A>G identified on the [facility]  Common Hereditary Cancers Panel. The report date is 08/16/2019.  The Common Hereditary Cancers Panel offered by [facility] includes sequencing and/or deletion duplication testing of the following 48 genes: APC, ATM,  AXIN2, BARD1, BMPR1A, BRCA1, BRCA2, BRIP1, CDH1, CDKN2A (p14ARF), CDKN2A (p16INK4a), CKD4, CHEK2, CTNNA1, DICER1, EPCAM (Deletion/duplication testing only), GREM1 (promoter region deletion/duplication testing only), KIT, MEN1, MLH1, MSH2, MSH3, MSH6, MUTYH, NBN, NF1, NHTL1, PALB2, PDGFRA, PMS2, POLD1, POLE, PTEN, RAD50, RAD51C, RAD51D, RNF43, SDHB, SDHC, SDHD, SMAD4, SMARCA4. STK11, TP53, TSC1, TSC2, and VHL.  The following genes were evaluated for sequence changes only: SDHA and HOXB13 c.251G>A variant only.     REVIEW OF SYSTEMS:   Constitutional: Denies fevers, chills or abnormal weight loss All other systems were reviewed with the patient and are negative. Observations/Objective:     Assessment Plan:  Malignant neoplasm of upper-outer quadrant of left breast in female, estrogen receptor positive (HCC) 10/02/2018: T2N0 stage Ib ILC ER/PR positive HER2 negative Ki-67 less than 1% 10/18/2018: Left mastectomy: T3N2 stage IIa grade 2 invasive lobular cancer, margins negative, 5/9 lymph nodes positive 11/22/2018-04/02/2019: Dose dense Adriamycin  and Cytoxan  x4 followed by weekly Taxol  x11 06/20/2019-07/05/2019: Adjuvant radiation Genetics: Negative   Current treatment: Adjuvant tamoxifen  started 08/16/2019 Tamoxifen  toxicities: 1.  Vaginal dryness: She stopped using the estrogen cream because she did not think she needs it anymore. 2. muscle cramps especially at night   Therefore we plan to perform labs in 1 year and then discussed switching her from tamoxifen  to letrozole therapy.   Breast cancer surveillance: 1.  Breast exam 12/13/2023: Benign 2. mammogram 12/09/22 at [facility]: Benign breast density category C 3.  Breast MRI 05/11/2022: Benign breast density category C 4.  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 Telephone visit in 3 months to discuss tolerance to letrozole therapy      I discussed the  assessment and treatment plan with the patient. The patient was provided an opportunity to ask questions and all were answered. The patient agreed with the plan and demonstrated an understanding of the instructions. The patient was advised to call back or seek an in-person evaluation if the symptoms worsen or if the condition fails to improve as anticipated.   I provided 20 minutes of non-face-to-face time during this encounter.  This includes time for charting and coordination of care   Margert Sheerer, MD

## 2023-12-27 ENCOUNTER — Encounter: Payer: Self-pay | Admitting: Plastic Surgery

## 2023-12-27 ENCOUNTER — Ambulatory Visit: Admitting: Plastic Surgery

## 2023-12-27 ENCOUNTER — Ambulatory Visit (INDEPENDENT_AMBULATORY_CARE_PROVIDER_SITE_OTHER): Admitting: Plastic Surgery

## 2023-12-27 VITALS — BP 110/72 | HR 75

## 2023-12-27 DIAGNOSIS — C50412 Malignant neoplasm of upper-outer quadrant of left female breast: Secondary | ICD-10-CM

## 2023-12-27 DIAGNOSIS — Z9012 Acquired absence of left breast and nipple: Secondary | ICD-10-CM | POA: Diagnosis not present

## 2023-12-27 DIAGNOSIS — Z170 Estrogen receptor positive status [ER+]: Secondary | ICD-10-CM

## 2023-12-27 NOTE — Progress Notes (Signed)
 Patient ID: [name], female    DOB: 05-Feb-1971, 53 y.o.   MRN: [id number]   Chief Complaint  Patient presents with   Follow-up   Breast Problem    The patient is a 53 year old female here for 1 year follow-up on her breast reconstruction.  She had a left mastectomy with implant placement 5 years ago.  She has a Dance movement psychotherapist round high-profile gel 225 cc implant in her left breast.  She forewent tattooing for the nipple areola can reconstruction.  She is overall doing well.  She had an MRI last year which was negative.  The implant is in place.  The skin is tight but no tighter than it has been.     Review of Systems  Constitutional: Negative.   HENT: Negative.    Eyes: Negative.   Respiratory: Negative.    Cardiovascular: Negative.   Gastrointestinal: Negative.   Endocrine: Negative.   Genitourinary: Negative.     Past Medical History:  Diagnosis Date   Cancer (HCC) 10/18/2018   left breast cancer   Family history of cancer     Past Surgical History:  Procedure Laterality Date   BREAST RECONSTRUCTION WITH PLACEMENT OF TISSUE EXPANDER AND FLEX HD (ACELLULAR HYDRATED DERMIS) Left 10/18/2018   Procedure: IMMEDIATEVBREAST RECONSTRUCTION WITH PLACEMENT OF TISSUE EXPANDER AND FLEX HD (ACELLULAR HYDRATED DERMIS);  Surgeon: Thornell Flirt, DO;  Location: [facility];  Service: Plastics;  Laterality: Left;   MASTECTOMY     MASTECTOMY W/ SENTINEL NODE BIOPSY Left 10/18/2018   Procedure: LEFT MASTECTOMY WITH SENTINEL LYMPH NODE BIOPSY;  Surgeon: Caralyn Chandler, MD;  Location: [facility];  Service: General;  Laterality: Left;   PORT-A-CATH REMOVAL Right 04/12/2019   Procedure: REMOVAL PORT-A-CATH;  Surgeon: Thornell Flirt, DO;  Location: [facility];  Service: Plastics;  Laterality: Right;   PORTACATH PLACEMENT Right 11/02/2018   Procedure: INSERTION PORT-A-CATH WITH ULTRASOUND;  Surgeon: Caralyn Chandler, MD;  Location: [facility];  Service: General;  Laterality: Right;   REMOVAL OF TISSUE EXPANDER AND PLACEMENT OF IMPLANT Left 04/12/2019   Procedure: REMOVAL OF TISSUE EXPANDER AND PLACEMENT OF IMPLANT;  Surgeon: Thornell Flirt, DO;  Location: [facility];  Service: Plastics;  Laterality: Left;  90 min, please      Current Outpatient Medications:    letrozole  (FEMARA ) 2.5 MG tablet, Take 1 tablet (2.5 mg total) by mouth daily., Disp: 90 tablet, Rfl: 3   Objective:   Vitals:   12/27/23 0852  BP: 110/72  Pulse: 75  SpO2: 98%    Physical Exam Vitals reviewed.  Constitutional:      Appearance: Normal appearance.  HENT:     Head: Atraumatic.  Cardiovascular:     Rate and Rhythm: Normal rate.     Pulses: Normal pulses.  Pulmonary:     Effort: Pulmonary effort is normal.  Abdominal:     Palpations: Abdomen is soft.  Skin:    General: Skin is warm.     Capillary Refill: Capillary refill takes less than 2 seconds.  Neurological:     Mental Status: She is alert and oriented to person, place, and time.  Psychiatric:        Mood and Affect: Mood normal.        Behavior: Behavior normal.        Thought Content: Thought content normal.        Judgment: Judgment normal.  Assessment & Plan:  Acquired absence of left breast  Malignant neoplasm of upper-outer quadrant of left breast in female, estrogen receptor positive (HCC)  No imaging needed since she had the MRI last year.  Continue with healthy diet and exercise.  Will plan to see her back in 1 year for follow-up.  Pictures were obtained of the patient and placed in the chart with the patient's or guardian's permission.   Lindaann Requena Lannis Lichtenwalner, DO

## 2023-12-30 ENCOUNTER — Ambulatory Visit: Payer: 59 | Admitting: Plastic Surgery

## 2024-01-03 ENCOUNTER — Ambulatory Visit: Payer: 59 | Admitting: Plastic Surgery

## 2024-01-04 LAB — SIGNATERA
SIGNATERA MTM READOUT: 0 MTM/ml
SIGNATERA TEST RESULT: NEGATIVE

## 2024-01-09 ENCOUNTER — Encounter: Payer: Self-pay | Admitting: Hematology and Oncology

## 2024-03-28 ENCOUNTER — Ambulatory Visit

## 2024-03-28 DIAGNOSIS — Z780 Asymptomatic menopausal state: Secondary | ICD-10-CM

## 2024-04-02 ENCOUNTER — Ambulatory Visit: Payer: Self-pay | Admitting: *Deleted

## 2024-04-02 NOTE — Telephone Encounter (Signed)
 Spoke with pt and informed her of bone density results  -  normal as per Morna, NP.

## 2024-04-02 NOTE — Telephone Encounter (Signed)
-----   Message from Morna JAYSON Kendall sent at 03/30/2024  3:31 PM EDT ----- Bone density testing normal, please notify patient ----- Message ----- From: Interface, Rad Results In Sent: 03/30/2024  10:12 AM EDT To: Mackey Chad, MD

## 2024-06-18 LAB — SIGNATERA ONLY (NATERA MANAGED)
SIGNATERA MTM READOUT: 0 MTM/ml
SIGNATERA TEST RESULT: NEGATIVE

## 2024-12-12 ENCOUNTER — Ambulatory Visit: Admitting: Hematology and Oncology

## 2024-12-28 ENCOUNTER — Ambulatory Visit: Admitting: Plastic Surgery
